# Patient Record
Sex: Female | Born: 1935 | Race: White | Hispanic: No | Smoking: Never smoker
Health system: Southern US, Community
[De-identification: ages and names within clinical notes are randomized; demographics above are authoritative.]

## PROBLEM LIST (undated history)

## (undated) DIAGNOSIS — IMO0001 Reserved for inherently not codable concepts without codable children: Secondary | ICD-10-CM

## (undated) DIAGNOSIS — K219 Gastro-esophageal reflux disease without esophagitis: Secondary | ICD-10-CM

## (undated) DIAGNOSIS — I10 Essential (primary) hypertension: Secondary | ICD-10-CM

## (undated) HISTORY — DX: Gastro-esophageal reflux disease without esophagitis: K21.9

## (undated) HISTORY — DX: Reserved for inherently not codable concepts without codable children: IMO0001

## (undated) HISTORY — PX: JOINT REPLACEMENT: SHX530

---

## 2001-03-11 ENCOUNTER — Ambulatory Visit (HOSPITAL_COMMUNITY): Admission: RE | Admit: 2001-03-11 | Discharge: 2001-03-11 | Payer: Self-pay | Admitting: Pulmonary Disease

## 2001-03-21 ENCOUNTER — Ambulatory Visit: Admission: RE | Admit: 2001-03-21 | Discharge: 2001-03-21 | Payer: Self-pay | Admitting: Pulmonary Disease

## 2007-09-23 ENCOUNTER — Emergency Department (HOSPITAL_COMMUNITY): Admission: EM | Admit: 2007-09-23 | Discharge: 2007-09-23 | Payer: Self-pay | Admitting: Emergency Medicine

## 2009-10-27 ENCOUNTER — Ambulatory Visit (HOSPITAL_COMMUNITY): Admission: RE | Admit: 2009-10-27 | Discharge: 2009-10-27 | Payer: Self-pay | Admitting: Pulmonary Disease

## 2009-10-27 ENCOUNTER — Encounter: Payer: Self-pay | Admitting: Orthopedic Surgery

## 2009-11-15 ENCOUNTER — Encounter: Payer: Self-pay | Admitting: Orthopedic Surgery

## 2009-12-01 ENCOUNTER — Ambulatory Visit: Payer: Self-pay | Admitting: Orthopedic Surgery

## 2009-12-01 DIAGNOSIS — M1710 Unilateral primary osteoarthritis, unspecified knee: Secondary | ICD-10-CM

## 2009-12-01 DIAGNOSIS — IMO0002 Reserved for concepts with insufficient information to code with codable children: Secondary | ICD-10-CM | POA: Insufficient documentation

## 2009-12-01 DIAGNOSIS — M25569 Pain in unspecified knee: Secondary | ICD-10-CM | POA: Insufficient documentation

## 2009-12-09 ENCOUNTER — Encounter: Payer: Self-pay | Admitting: Orthopedic Surgery

## 2009-12-15 ENCOUNTER — Ambulatory Visit: Payer: Self-pay | Admitting: Orthopedic Surgery

## 2010-01-12 ENCOUNTER — Ambulatory Visit: Payer: Self-pay | Admitting: Orthopedic Surgery

## 2010-05-31 ENCOUNTER — Ambulatory Visit: Payer: Self-pay | Admitting: Orthopedic Surgery

## 2010-08-15 NOTE — Letter (Signed)
Summary: Office note Dr Rulon Sera  Office note Dr Rulon Sera   Imported By: Cammie Sickle 12/02/2009 12:50:26  _____________________________________________________________________  External Attachment:    Type:   Image     Comment:   External Document

## 2010-08-15 NOTE — Assessment & Plan Note (Signed)
Summary: REQ INJECTION IN RT KNEE XR AP AP MAY'11/[facility]/AETNA/[facility]WKINS/BS[facility]   Referring Provider:  self Primary Provider:  Dr. Juanetta Gosling   History of Present Illness: 75 year old female presents for followup visit status post lateral in the winter of 2000, 10 we've fitted her with injections x2. On 2 separate occasions, which held her off in terms of pain relief for several months. She presents back after last injection in June complaining of medial pain again and some swelling.  Meds: Lipitor, Bystolic, Diovan, Prevacid, no pain med, Ibuprofen hurt her stomach.  Today is a 4 week recheck after 2nd  injection, OA.  The shot helped., however, she would like another injection of the RIGHT knee.  Complains of pain and swelling medially, stiffness, no catching, locking, or giving way        Allergies: No Known Drug Allergies  Physical Exam  Additional Exam:  She is ambulating without assistive device. She has 125 of flexion. The knee with full extension. She has medial joint line tenderness mild effusion. Normal strength normal skin normal. Pulses sensation.  Patient is oriented x3. Mood and affect are normal and overall general appearance is normal.     Impression & Recommendations:  Problem # 1:  KNEE, ARTHRITIS, DEGEN./OSTEO (ICD-715.96) Assessment Comment Only  previous x-rays show a fairly moderate to severe amount of arthritis in the knee with joint space narrowing, so I suspect her disease is progressing.  RIGHT knee intra-articular injection Verbal consent was obtained. The RIGHT knee was prepped with alcohol and ethyl chloride. 1 cc of depomedrol 40mg /cc and 4 cc of lidocaine 1% was injected. there were no complications.  Orders: Est. Patient Level III (28413) Joint Aspirate / Injection, Large (20610) Depo- Medrol 40mg  (J1030)  Patient Instructions: 1)  You have received an injection of cortisone today. You may experience increased pain at the injection site. Apply  ice pack to the area for 20 minutes every 2 hours and take 2 xtra strength tylenol every 8 hours. This increased pain will usually resolve in 24 hours. The injection will take effect in 3-10 days.  2)  return in 3 months    Orders Added: 1)  Est. Patient Level III [24401] 2)  Joint Aspirate / Injection, Large [20610] 3)  Depo- Medrol 40mg  [J1030]

## 2010-08-15 NOTE — Assessment & Plan Note (Signed)
Summary: 4 WK RE-CK KNEE/POST INJECTION/MEDICARE,AETNA/CAF   Visit Type:  Follow-up Referring Provider:  self Primary Provider:  Dr. Juanetta Gosling  CC:  recheck knee.  History of Present Illness: 75 year old female presents with pain RIGHT knee medial side since a fall on some ice in the winter of 2010.  She has medial sharp throbbing stabbing burning knee pain which is 8/10 and a constant somewhat relieved with  ibuprofen which she only takes at night she did take a prednisone Dosepak which did help.  She denies catching or locking but does complain of swelling  Meds: Lipitor, Bystolic, Diovan, Prevacid, no pain med, Ibuprofen hurt her stomach.  Today is a 4 week recheck after 2nd  injection, OA.  The shot helped.        Allergies: No Known Drug Allergies  Physical Exam  Additional Exam:  right knee  mild tenderness med joint line  ROM normal Motor 5/5 ligamnets are stable  McMurrays negative      Impression & Recommendations:  Problem # 1:  KNEE PAIN (ICD-719.46) Assessment Improved  Orders: Est. Patient Level II (16109)  Problem # 2:  KNEE, ARTHRITIS, DEGEN./OSTEO (UEA-540.98) Assessment: Improved  Orders: Est. Patient Level II (11914)  Patient Instructions: 1)  Tylenol XS if needed  2)  Reschedule if pain worsens  3)  f/u as needed

## 2010-08-15 NOTE — Letter (Signed)
Summary: History form  History form   Imported By: Jacklynn Ganong 12/09/2009 08:24:33  _____________________________________________________________________  External Attachment:    Type:   Image     Comment:   External Document

## 2010-08-15 NOTE — Assessment & Plan Note (Signed)
Summary: 2 wk RE-CK RT KNEE/MEDICARE/AETNA/CAF   Visit Type:  Follow-up Referring Provider:  self Primary Provider:  Dr. Juanetta Gosling  CC:  RECHECK RT KNEE.  History of Present Illness: 75 year old female presents with pain RIGHT knee medial side since a fall on some ice in the winter of 2010.  She has medial sharp throbbing stabbing burning knee pain which is 8/10 and a constant somewhat relieved with  ibuprofen which she only takes at night she did take a prednisone Dosepak which did help.  She denies catching or locking but does complain of swelling  Meds: Lipitor, Bystolic, Diovan, Prevacid, Ibuprofen.  TODAY IS A 2 WEEK RECHECK RIGHT KNEE AFTER INJECTION AND IBUPROFEN 800MG  three times a day   HELPS.  She still has some pain over the medial side of the knee and she feels that there is a pocket of fluid and anteromedial joint line area.           Allergies: No Known Drug Allergies  Review of Systems       denies catching locking or giving way  Physical Exam  Psych:  alert and cooperative; normal mood and affect; normal attention span and concentration   Knee Exam  General:    Well-developed, well-nourished, normal body habitus; no deformities, normal grooming.  Gait:    Normal heel-toe gait pattern bilaterally.    Skin:    Intact, no scars, lesions, rashes, cafe au lait spots, or bruising.    Inspection:    possible effusionswelling:   Palpation:    tenderness R-medial joint line.    Vascular:    There was no swelling or varicose veins. The pulses and temperature are normal. There was no edema or tenderness.  Sensory:    Gross coordination and sensation were normal.    Motor:    Motor strength 5/5 bilaterally for quadriceps, hamstrings, ankle dorsiflexion, and ankle plantar flexion.    Knee Exam:    Right:    Inspection:  Abnormal    Palpation:  Abnormal    Stability:  stable    Tenderness:  medial joint line tenderness    the McMurray sign is  still not 100% positive  The screw home test still gets some response to pain  Flexion arc 120   Impression & Recommendations:  Problem # 1:  KNEE PAIN (ICD-719.46)  cannot rule out meniscal tear  Orders: Est. Patient Level III (57846) Joint Aspirate / Injection, Large (20610) Depo- Medrol 40mg  (J1030)  Problem # 2:  KNEE, ARTHRITIS, DEGEN./OSTEO (ICD-715.96)  inject RIGHT knee  Orders: Est. Patient Level III (96295) Joint Aspirate / Injection, Large (20610) Depo- Medrol 40mg  (J1030)  Patient Instructions: 1)  You have received an injection of cortisone today. You may experience increased pain at the injection site. Apply ice pack to the area for 20 minutes every 2 hours and take 2 xtra strength tylenol every 8 hours. This increased pain will usually resolve in 24 hours. The injection will take effect in 3-10 days.  2)  4 weeks re check post 2nd injection

## 2010-08-15 NOTE — Assessment & Plan Note (Signed)
Summary: CONSULT/TREAT RT KNEE PAIN.XRAY [facility]/REF E.[facility]INS/MEDICARE,AE...   Vital Signs:  Patient profile:   75 year old female Height:      65 inches Weight:      175 pounds Pulse rate:   68 / minute Resp:     16 per minute  Vitals Entered By: [name] MD (Dec 01, 2009 11:03 AM)  Visit Type:  new patient Referring Provider:  self Primary Provider:  Dr. Juanetta Gosling  CC:  right knee pain.  History of Present Illness: 75 year old female presents with pain RIGHT knee medial side since a fall on some ice in the winter of 2010.  She has medial sharp throbbing stabbing burning knee pain which is 8/10 and a constant somewhat relieved with Porter milligrams ibuprofen which she only takes at night she did take a prednisone Dosepak which did help.  She denies catching or locking but does complain of swelling  Meds: Lipitor, Bystolic, Diovan, Prevacid.    Allergies (verified): No Known Drug Allergies  Past History:  Past Medical History: reflux hyn cholesterol  Past Surgical History: na  Family History: na  Social History: Patient is married.  retired no smoking no alcohol average daily caffeine use  Review of Systems Constitutional:  Denies weight loss, weight gain, fever, chills, and fatigue. Cardiovascular:  Denies chest pain, palpitations, fainting, and murmurs. Respiratory:  Complains of snoring; denies short of breath, wheezing, couch, tightness, pain on inspiration, and snoring . Gastrointestinal:  Complains of heartburn; denies nausea, vomiting, diarrhea, constipation, and blood in your stools. Genitourinary:  Denies frequency, urgency, difficulty urinating, painful urination, flank pain, and bleeding in urine. Neurologic:  Denies numbness, tingling, unsteady gait, dizziness, tremors, and seizure. Musculoskeletal:  Complains of joint pain, swelling, stiffness, and heat; denies instability, redness, and muscle pain. Endocrine:  Denies excessive thirst,  exessive urination, and heat or cold intolerance. Psychiatric:  Denies nervousness, depression, anxiety, and hallucinations. Skin:  Denies changes in the skin, poor healing, rash, itching, and redness. HEENT:  Denies blurred or double vision, eye pain, redness, and watering. Immunology:  Denies seasonal allergies, sinus problems, and allergic to bee stings. Hemoatologic:  Denies easy bleeding and brusing.  Physical Exam  Additional Exam:  Constitutional: vital signs see recorded values. General: normal development, nutrition, and grooming. No deformity. Body Habitus is medium. CDV: Observation and palpation was normal  Lymph: palpation of the lymph nodes were normal Skin: inspection and palpation of the skin revealed no abnormalities  Neuro: coordination: normal              DTR's normal              Sensation was normal  Psyche: Alert and oriented x 3. Mood was normal.  Affect: normal  MSK: Gait: normal   RIGHT knee medial joint line tenderness is quite significant.  She has full range of motion of the knee with some pain with terminal knee extension and terminal knee flexion, she does have a positive McMurray sign.  There is no joint effusion.  Motor exam is normal.  The knee is stable.  LEFT knee full range of motion normal strength stability and alignment no tenderness or swelling   Impression & Recommendations:  Problem # 1:  KNEE, ARTHRITIS, DEGEN./OSTEO (ICD-715.96) Assessment New  the hospital x-rays in the report both informed that she has a fair amount of medial joint space narrowing and patellofemoral disease with confirmatory osteoarthritis.  She probably has awakened her arthritis secondary to the injury and the plantar  of 2010.  I would like to try nonoperative treatment with injections, anti-inflammatory then reexamine in 2 weeks  RIGHT knee injection medial approach joint injection Verbal consent was obtained. The knee was prepped with alcohol and ethyl chloride. 1  cc of depomedrol 40mg /cc and 4 cc of lidocaine 1% was injected. there were no complications.  Orders: New Patient Level III (64403) Depo- Medrol 40mg  (J1030) Joint Aspirate / Injection, Large (20610)  Problem # 2:  KNEE PAIN (KVQ-259.56) Assessment: New  Orders: New Patient Level III (38756) Depo- Medrol 40mg  (J1030) Joint Aspirate / Injection, Large (20610)  Patient Instructions: 1)  You have received an injection of cortisone today. You may experience increased pain at the injection site. Apply ice pack to the area for 20 minutes every 2 hours and take 2 xtra strength tylenol every 8 hours. This increased pain will usually resolve in 24 hours. The injection will take effect in 3-10 days.  2)  return in 2 weeks  3)  take ibuprofen three times a day

## 2010-08-31 ENCOUNTER — Encounter: Payer: Self-pay | Admitting: Orthopedic Surgery

## 2010-08-31 ENCOUNTER — Ambulatory Visit (INDEPENDENT_AMBULATORY_CARE_PROVIDER_SITE_OTHER): Payer: Medicare Other | Admitting: Orthopedic Surgery

## 2010-08-31 DIAGNOSIS — M1710 Unilateral primary osteoarthritis, unspecified knee: Secondary | ICD-10-CM

## 2010-08-31 DIAGNOSIS — IMO0002 Reserved for concepts with insufficient information to code with codable children: Secondary | ICD-10-CM | POA: Insufficient documentation

## 2010-09-01 ENCOUNTER — Telehealth: Payer: Self-pay | Admitting: Orthopedic Surgery

## 2010-09-05 ENCOUNTER — Encounter: Payer: Self-pay | Admitting: Orthopedic Surgery

## 2010-09-06 NOTE — Assessment & Plan Note (Signed)
Summary: 3 MTH RECK RT KNEE/POS INJECT/AETNA/CAF   MAY HAVE DIFF INS T...   Visit Type:  Follow-up Referring Provider:  self Primary Provider:  Dr. Juanetta Gosling  CC:  recheck right knee.  History of Present Illness: Today is 3 month recheck on right knee OA.  Meds: Lipitor, Bystolic, Diovan, Prevacid, no pain med, Ibuprofen 800mg  at night helps.  05/31/10 last injection in this knee, right.  Injection helped some.  Her left knee is hurting for a while, no injury, no injections have been given.  Pain level today is around 8.   She is very active and she is very active in her yard. She was to keep moving.  Her LEFT knee is bothering her on the medial side without any trauma. She says it feels like something is moving around in there.  She is currently on ibuprofen 800, just at night, which seems to help. If she is having 8/10 pain today.            Allergies: No Known Drug Allergies  Review of Systems      See HPI  Physical Exam  Additional Exam:   General appearance is normal She is going to x3. She is a pleasant. She is ambulating normally. LEFT knee medial joint line tenderness positive McMurray sign. Positive click medially. He otherwise stable strength is normal. Skin is intact.  RIGHT knee mild medial tenderness. No effusion. Distal pulses and temperature normal, no edema   Impression & Recommendations:  Problem # 1:  KNEE, ARTHRITIS, DEGEN./OSTEO (ICD-715.96) Assessment Unchanged  RIGHT knee osteoarthritis, stable  Orders: Est. Patient Level III (04540)  Problem # 2:  MENISCUS TEAR, LEFT (ICD-836.2) Assessment: New  osteoarthritis possible meniscal tear, LEFT knee.  Recommend LEFT knee injection Verbal consent was obtained. The left knee was prepped with alcohol and ethyl chloride. 1 cc of depomedrol 40mg /cc and 4 cc of lidocaine 1% was injected. there were no complications.  Orders: Est. Patient Level III (98119)  Other Orders: Joint  Aspirate / Injection, Large (20610) Depo- Medrol 40mg  (J1030)  Patient Instructions: 1)  Take TYLENOL  (ACETAMINOPHEN) 500 MG three times a day AS NEEDED FOR KNEE PAIN  2)  You have received an injection of cortisone today. You may experience increased pain at the injection site. Apply ice pack to the area for 20 minutes every 2 hours and take 2 xtra strength tylenol every 8 hours. This increased pain will usually resolve in 24 hours. The injection will take effect in 3-10 days.  3)  Please schedule a follow-up appointment as needed.   Orders Added: 1)  Est. Patient Level III [14782] 2)  Joint Aspirate / Injection, Large [20610] 3)  Depo- Medrol 40mg  [J1030]

## 2010-09-12 NOTE — Letter (Signed)
Summary: Received Office note Dr Juanetta Gosling DOS 09/01/10  Office note Dr Juanetta Gosling   Imported By: Cammie Sickle 09/05/2010 11:48:48  _____________________________________________________________________  External Attachment:    Type:   Image     Comment:   External Document

## 2010-09-12 NOTE — Progress Notes (Signed)
Summary: thinks she is having a reaction to the injection  Phone Note Call from Patient   Summary of Call: Julia Choi called this morning saying she thinks she is having an allergic reaction to the injection she received yesterday.  Said her face is hot and flushed.  Told her you are in surgery today and to go to the emergency room if she thinks she needs to.  York Spaniel she will either call her primary care doctor or go to the emergency room. Her # [phone] Initial call taken by: Jacklynn Ganong,  September 01, 2010 10:26 AM

## 2011-08-23 DIAGNOSIS — I10 Essential (primary) hypertension: Secondary | ICD-10-CM | POA: Diagnosis not present

## 2011-08-23 DIAGNOSIS — E785 Hyperlipidemia, unspecified: Secondary | ICD-10-CM | POA: Diagnosis not present

## 2011-08-23 DIAGNOSIS — G4730 Sleep apnea, unspecified: Secondary | ICD-10-CM | POA: Diagnosis not present

## 2011-11-16 DIAGNOSIS — E785 Hyperlipidemia, unspecified: Secondary | ICD-10-CM | POA: Diagnosis not present

## 2011-11-16 DIAGNOSIS — I10 Essential (primary) hypertension: Secondary | ICD-10-CM | POA: Diagnosis not present

## 2011-11-22 DIAGNOSIS — E785 Hyperlipidemia, unspecified: Secondary | ICD-10-CM | POA: Diagnosis not present

## 2011-11-22 DIAGNOSIS — I6789 Other cerebrovascular disease: Secondary | ICD-10-CM | POA: Diagnosis not present

## 2011-11-22 DIAGNOSIS — I10 Essential (primary) hypertension: Secondary | ICD-10-CM | POA: Diagnosis not present

## 2012-02-21 DIAGNOSIS — G4730 Sleep apnea, unspecified: Secondary | ICD-10-CM | POA: Diagnosis not present

## 2012-02-21 DIAGNOSIS — I10 Essential (primary) hypertension: Secondary | ICD-10-CM | POA: Diagnosis not present

## 2012-02-21 DIAGNOSIS — E785 Hyperlipidemia, unspecified: Secondary | ICD-10-CM | POA: Diagnosis not present

## 2012-04-08 DIAGNOSIS — H26499 Other secondary cataract, unspecified eye: Secondary | ICD-10-CM | POA: Diagnosis not present

## 2012-04-08 DIAGNOSIS — Z961 Presence of intraocular lens: Secondary | ICD-10-CM | POA: Diagnosis not present

## 2012-05-07 DIAGNOSIS — Z23 Encounter for immunization: Secondary | ICD-10-CM | POA: Diagnosis not present

## 2012-05-27 DIAGNOSIS — I10 Essential (primary) hypertension: Secondary | ICD-10-CM | POA: Diagnosis not present

## 2012-05-27 DIAGNOSIS — E785 Hyperlipidemia, unspecified: Secondary | ICD-10-CM | POA: Diagnosis not present

## 2012-08-27 DIAGNOSIS — I10 Essential (primary) hypertension: Secondary | ICD-10-CM | POA: Diagnosis not present

## 2012-08-27 DIAGNOSIS — E785 Hyperlipidemia, unspecified: Secondary | ICD-10-CM | POA: Diagnosis not present

## 2012-08-27 DIAGNOSIS — F411 Generalized anxiety disorder: Secondary | ICD-10-CM | POA: Diagnosis not present

## 2012-09-03 DIAGNOSIS — I10 Essential (primary) hypertension: Secondary | ICD-10-CM | POA: Diagnosis not present

## 2012-09-03 DIAGNOSIS — E785 Hyperlipidemia, unspecified: Secondary | ICD-10-CM | POA: Diagnosis not present

## 2012-12-11 DIAGNOSIS — E785 Hyperlipidemia, unspecified: Secondary | ICD-10-CM | POA: Diagnosis not present

## 2012-12-11 DIAGNOSIS — F411 Generalized anxiety disorder: Secondary | ICD-10-CM | POA: Diagnosis not present

## 2012-12-11 DIAGNOSIS — I10 Essential (primary) hypertension: Secondary | ICD-10-CM | POA: Diagnosis not present

## 2012-12-11 DIAGNOSIS — G4730 Sleep apnea, unspecified: Secondary | ICD-10-CM | POA: Diagnosis not present

## 2013-03-13 DIAGNOSIS — G4730 Sleep apnea, unspecified: Secondary | ICD-10-CM | POA: Diagnosis not present

## 2013-03-13 DIAGNOSIS — R5381 Other malaise: Secondary | ICD-10-CM | POA: Diagnosis not present

## 2013-03-13 DIAGNOSIS — E669 Obesity, unspecified: Secondary | ICD-10-CM | POA: Diagnosis not present

## 2013-03-13 DIAGNOSIS — E785 Hyperlipidemia, unspecified: Secondary | ICD-10-CM | POA: Diagnosis not present

## 2013-03-18 DIAGNOSIS — R7989 Other specified abnormal findings of blood chemistry: Secondary | ICD-10-CM | POA: Diagnosis not present

## 2013-03-18 DIAGNOSIS — I10 Essential (primary) hypertension: Secondary | ICD-10-CM | POA: Diagnosis not present

## 2013-03-18 DIAGNOSIS — E669 Obesity, unspecified: Secondary | ICD-10-CM | POA: Diagnosis not present

## 2013-03-18 DIAGNOSIS — F411 Generalized anxiety disorder: Secondary | ICD-10-CM | POA: Diagnosis not present

## 2013-03-18 DIAGNOSIS — G4730 Sleep apnea, unspecified: Secondary | ICD-10-CM | POA: Diagnosis not present

## 2013-03-18 DIAGNOSIS — R5381 Other malaise: Secondary | ICD-10-CM | POA: Diagnosis not present

## 2013-03-18 DIAGNOSIS — E785 Hyperlipidemia, unspecified: Secondary | ICD-10-CM | POA: Diagnosis not present

## 2013-04-15 DIAGNOSIS — H26499 Other secondary cataract, unspecified eye: Secondary | ICD-10-CM | POA: Diagnosis not present

## 2013-04-15 DIAGNOSIS — Z961 Presence of intraocular lens: Secondary | ICD-10-CM | POA: Diagnosis not present

## 2013-05-08 DIAGNOSIS — Z23 Encounter for immunization: Secondary | ICD-10-CM | POA: Diagnosis not present

## 2013-05-18 DIAGNOSIS — H26499 Other secondary cataract, unspecified eye: Secondary | ICD-10-CM | POA: Diagnosis not present

## 2013-06-17 DIAGNOSIS — M1990 Unspecified osteoarthritis, unspecified site: Secondary | ICD-10-CM | POA: Diagnosis not present

## 2013-06-17 DIAGNOSIS — I10 Essential (primary) hypertension: Secondary | ICD-10-CM | POA: Diagnosis not present

## 2013-06-17 DIAGNOSIS — E785 Hyperlipidemia, unspecified: Secondary | ICD-10-CM | POA: Diagnosis not present

## 2013-06-17 DIAGNOSIS — G4730 Sleep apnea, unspecified: Secondary | ICD-10-CM | POA: Diagnosis not present

## 2013-09-25 DIAGNOSIS — E785 Hyperlipidemia, unspecified: Secondary | ICD-10-CM | POA: Diagnosis not present

## 2013-09-25 DIAGNOSIS — I10 Essential (primary) hypertension: Secondary | ICD-10-CM | POA: Diagnosis not present

## 2013-09-25 DIAGNOSIS — F411 Generalized anxiety disorder: Secondary | ICD-10-CM | POA: Diagnosis not present

## 2014-03-31 DIAGNOSIS — R7989 Other specified abnormal findings of blood chemistry: Secondary | ICD-10-CM | POA: Diagnosis not present

## 2014-03-31 DIAGNOSIS — G4730 Sleep apnea, unspecified: Secondary | ICD-10-CM | POA: Diagnosis not present

## 2014-03-31 DIAGNOSIS — K2100 Gastro-esophageal reflux disease with esophagitis, without bleeding: Secondary | ICD-10-CM | POA: Diagnosis not present

## 2014-03-31 DIAGNOSIS — E785 Hyperlipidemia, unspecified: Secondary | ICD-10-CM | POA: Diagnosis not present

## 2014-03-31 DIAGNOSIS — Z23 Encounter for immunization: Secondary | ICD-10-CM | POA: Diagnosis not present

## 2014-03-31 DIAGNOSIS — I10 Essential (primary) hypertension: Secondary | ICD-10-CM | POA: Diagnosis not present

## 2014-03-31 DIAGNOSIS — G4700 Insomnia, unspecified: Secondary | ICD-10-CM | POA: Diagnosis not present

## 2014-04-12 DIAGNOSIS — Z23 Encounter for immunization: Secondary | ICD-10-CM | POA: Diagnosis not present

## 2014-09-29 DIAGNOSIS — E119 Type 2 diabetes mellitus without complications: Secondary | ICD-10-CM | POA: Diagnosis not present

## 2014-09-29 DIAGNOSIS — I10 Essential (primary) hypertension: Secondary | ICD-10-CM | POA: Diagnosis not present

## 2015-01-03 DIAGNOSIS — H26492 Other secondary cataract, left eye: Secondary | ICD-10-CM | POA: Diagnosis not present

## 2015-01-03 DIAGNOSIS — H04123 Dry eye syndrome of bilateral lacrimal glands: Secondary | ICD-10-CM | POA: Diagnosis not present

## 2015-01-06 DIAGNOSIS — I10 Essential (primary) hypertension: Secondary | ICD-10-CM | POA: Diagnosis not present

## 2015-01-06 DIAGNOSIS — G4710 Hypersomnia, unspecified: Secondary | ICD-10-CM | POA: Diagnosis not present

## 2015-01-06 DIAGNOSIS — F419 Anxiety disorder, unspecified: Secondary | ICD-10-CM | POA: Diagnosis not present

## 2015-01-06 DIAGNOSIS — E119 Type 2 diabetes mellitus without complications: Secondary | ICD-10-CM | POA: Diagnosis not present

## 2015-05-31 ENCOUNTER — Other Ambulatory Visit (HOSPITAL_COMMUNITY): Payer: Self-pay | Admitting: Pulmonary Disease

## 2015-05-31 DIAGNOSIS — R1011 Right upper quadrant pain: Secondary | ICD-10-CM

## 2015-05-31 DIAGNOSIS — E119 Type 2 diabetes mellitus without complications: Secondary | ICD-10-CM | POA: Diagnosis not present

## 2015-05-31 DIAGNOSIS — Z23 Encounter for immunization: Secondary | ICD-10-CM | POA: Diagnosis not present

## 2015-05-31 DIAGNOSIS — F419 Anxiety disorder, unspecified: Secondary | ICD-10-CM | POA: Diagnosis not present

## 2015-05-31 DIAGNOSIS — I10 Essential (primary) hypertension: Secondary | ICD-10-CM | POA: Diagnosis not present

## 2015-05-31 DIAGNOSIS — Z634 Disappearance and death of family member: Secondary | ICD-10-CM | POA: Diagnosis not present

## 2015-06-02 ENCOUNTER — Ambulatory Visit (HOSPITAL_COMMUNITY)
Admission: RE | Admit: 2015-06-02 | Discharge: 2015-06-02 | Disposition: A | Discharge status: Home / Self Care | Payer: Medicare Other | Source: Ambulatory Visit | Attending: Pulmonary Disease | Admitting: Pulmonary Disease

## 2015-06-02 DIAGNOSIS — R1011 Right upper quadrant pain: Secondary | ICD-10-CM | POA: Insufficient documentation

## 2015-06-03 ENCOUNTER — Other Ambulatory Visit (HOSPITAL_COMMUNITY): Payer: Self-pay | Admitting: Pulmonary Disease

## 2015-06-03 DIAGNOSIS — R1011 Right upper quadrant pain: Secondary | ICD-10-CM

## 2015-06-07 ENCOUNTER — Encounter (HOSPITAL_COMMUNITY): Payer: Self-pay

## 2015-06-07 ENCOUNTER — Encounter (HOSPITAL_COMMUNITY)
Admission: RE | Admit: 2015-06-07 | Discharge: 2015-06-07 | Disposition: A | Discharge status: Home / Self Care | Payer: Medicare Other | Source: Ambulatory Visit | Attending: Pulmonary Disease | Admitting: Pulmonary Disease

## 2015-06-07 DIAGNOSIS — R1011 Right upper quadrant pain: Secondary | ICD-10-CM | POA: Diagnosis not present

## 2015-06-07 HISTORY — DX: Essential (primary) hypertension: I10

## 2015-06-07 MED ORDER — STERILE WATER FOR INJECTION IJ SOLN
INTRAMUSCULAR | Status: AC
  Administered 2015-06-07: 1.55 mL via INTRAVENOUS
  Filled 2015-06-07: qty 10

## 2015-06-07 MED ORDER — TECHNETIUM TC 99M MEBROFENIN IV KIT
5.0000 | PACK | Freq: Once | INTRAVENOUS | Status: DC | PRN
  Administered 2015-06-07: 5.4 via INTRAVENOUS
  Filled 2015-06-07: qty 6

## 2015-06-07 MED ORDER — SINCALIDE 5 MCG IJ SOLR
INTRAMUSCULAR | Status: AC
  Administered 2015-06-07: 1.55 ug via INTRAVENOUS
  Filled 2015-06-07: qty 5

## 2015-06-07 MED ORDER — SODIUM CHLORIDE 0.9 % IJ SOLN
INTRAMUSCULAR | Status: AC
  Filled 2015-06-07: qty 12

## 2016-12-15 ENCOUNTER — Inpatient Hospital Stay (HOSPITAL_COMMUNITY)
Admission: EM | Admit: 2016-12-15 | Discharge: 2016-12-18 | DRG: 482 | Disposition: A | Discharge status: Skilled Nursing Facility | Payer: Medicare Other | Attending: Pulmonary Disease | Admitting: Pulmonary Disease

## 2016-12-15 ENCOUNTER — Encounter (HOSPITAL_COMMUNITY): Payer: Self-pay | Admitting: *Deleted

## 2016-12-15 ENCOUNTER — Emergency Department (HOSPITAL_COMMUNITY): Payer: Medicare Other

## 2016-12-15 DIAGNOSIS — E785 Hyperlipidemia, unspecified: Secondary | ICD-10-CM | POA: Diagnosis present

## 2016-12-15 DIAGNOSIS — I10 Essential (primary) hypertension: Secondary | ICD-10-CM

## 2016-12-15 DIAGNOSIS — D696 Thrombocytopenia, unspecified: Secondary | ICD-10-CM | POA: Diagnosis present

## 2016-12-15 DIAGNOSIS — S72001A Fracture of unspecified part of neck of right femur, initial encounter for closed fracture: Secondary | ICD-10-CM

## 2016-12-15 DIAGNOSIS — M25551 Pain in right hip: Secondary | ICD-10-CM | POA: Diagnosis present

## 2016-12-15 DIAGNOSIS — G4730 Sleep apnea, unspecified: Secondary | ICD-10-CM | POA: Diagnosis present

## 2016-12-15 DIAGNOSIS — S72144A Nondisplaced intertrochanteric fracture of right femur, initial encounter for closed fracture: Principal | ICD-10-CM | POA: Diagnosis present

## 2016-12-15 DIAGNOSIS — E782 Mixed hyperlipidemia: Secondary | ICD-10-CM

## 2016-12-15 DIAGNOSIS — K219 Gastro-esophageal reflux disease without esophagitis: Secondary | ICD-10-CM | POA: Diagnosis present

## 2016-12-15 DIAGNOSIS — Z888 Allergy status to other drugs, medicaments and biological substances status: Secondary | ICD-10-CM | POA: Diagnosis not present

## 2016-12-15 DIAGNOSIS — Y92015 Private garage of single-family (private) house as the place of occurrence of the external cause: Secondary | ICD-10-CM

## 2016-12-15 DIAGNOSIS — W19XXXA Unspecified fall, initial encounter: Secondary | ICD-10-CM

## 2016-12-15 DIAGNOSIS — D649 Anemia, unspecified: Secondary | ICD-10-CM | POA: Diagnosis not present

## 2016-12-15 DIAGNOSIS — W010XXA Fall on same level from slipping, tripping and stumbling without subsequent striking against object, initial encounter: Secondary | ICD-10-CM | POA: Diagnosis present

## 2016-12-15 DIAGNOSIS — R112 Nausea with vomiting, unspecified: Secondary | ICD-10-CM

## 2016-12-15 DIAGNOSIS — E876 Hypokalemia: Secondary | ICD-10-CM | POA: Diagnosis present

## 2016-12-15 DIAGNOSIS — S72001D Fracture of unspecified part of neck of right femur, subsequent encounter for closed fracture with routine healing: Secondary | ICD-10-CM

## 2016-12-15 DIAGNOSIS — S72009A Fracture of unspecified part of neck of unspecified femur, initial encounter for closed fracture: Secondary | ICD-10-CM | POA: Diagnosis present

## 2016-12-15 LAB — CBC WITH DIFFERENTIAL/PLATELET
BASOS ABS: 0 10*3/uL (ref 0.0–0.1)
Basophils Relative: 0 %
EOS PCT: 0 %
Eosinophils Absolute: 0 10*3/uL (ref 0.0–0.7)
HCT: 31.7 % — ABNORMAL LOW (ref 36.0–46.0)
Hemoglobin: 11.1 g/dL — ABNORMAL LOW (ref 12.0–15.0)
LYMPHS ABS: 1.6 10*3/uL (ref 0.7–4.0)
LYMPHS PCT: 31 %
MCH: 31.9 pg (ref 26.0–34.0)
MCHC: 35 g/dL (ref 30.0–36.0)
MCV: 91.1 fL (ref 78.0–100.0)
MONO ABS: 0.2 10*3/uL (ref 0.1–1.0)
Monocytes Relative: 4 %
Neutro Abs: 3.4 10*3/uL (ref 1.7–7.7)
Neutrophils Relative %: 65 %
PLATELETS: 140 10*3/uL — AB (ref 150–400)
RBC: 3.48 MIL/uL — ABNORMAL LOW (ref 3.87–5.11)
RDW: 12.9 % (ref 11.5–15.5)
WBC: 5.2 10*3/uL (ref 4.0–10.5)

## 2016-12-15 LAB — BASIC METABOLIC PANEL
Anion gap: 8 (ref 5–15)
BUN: 20 mg/dL (ref 6–20)
CO2: 27 mmol/L (ref 22–32)
Calcium: 9 mg/dL (ref 8.9–10.3)
Chloride: 103 mmol/L (ref 101–111)
Creatinine, Ser: 1.08 mg/dL — ABNORMAL HIGH (ref 0.44–1.00)
GFR calc Af Amer: 55 mL/min — ABNORMAL LOW (ref >60)
GFR, EST NON AFRICAN AMERICAN: 48 mL/min — AB (ref >60)
GLUCOSE: 167 mg/dL — AB (ref 65–99)
POTASSIUM: 3.1 mmol/L — AB (ref 3.5–5.1)
Sodium: 138 mmol/L (ref 135–145)

## 2016-12-15 LAB — URINALYSIS, ROUTINE W REFLEX MICROSCOPIC
Bilirubin Urine: NEGATIVE
Glucose, UA: NEGATIVE mg/dL
Hgb urine dipstick: NEGATIVE
Ketones, ur: NEGATIVE mg/dL
Leukocytes, UA: NEGATIVE
NITRITE: NEGATIVE
Protein, ur: NEGATIVE mg/dL
SPECIFIC GRAVITY, URINE: 1.009 (ref 1.005–1.030)
pH: 6 (ref 5.0–8.0)

## 2016-12-15 LAB — ABO/RH: ABO/RH(D): O POS

## 2016-12-15 LAB — PREPARE RBC (CROSSMATCH)

## 2016-12-15 LAB — PROTIME-INR
INR: 1.15
Prothrombin Time: 14.7 seconds (ref 11.4–15.2)

## 2016-12-15 MED ORDER — METHOCARBAMOL 1000 MG/10ML IJ SOLN
500.0000 mg | Freq: Four times a day (QID) | INTRAVENOUS | Status: DC | PRN
  Filled 2016-12-15: qty 5

## 2016-12-15 MED ORDER — NEBIVOLOL HCL 10 MG PO TABS
10.0000 mg | ORAL_TABLET | Freq: Every day | ORAL | Status: DC
  Administered 2016-12-15 – 2016-12-18 (×4): 10 mg via ORAL
  Filled 2016-12-15 (×4): qty 1

## 2016-12-15 MED ORDER — PROMETHAZINE HCL 25 MG/ML IJ SOLN
12.5000 mg | Freq: Once | INTRAMUSCULAR | Status: AC
  Administered 2016-12-15: 12.5 mg via INTRAVENOUS
  Filled 2016-12-15: qty 1

## 2016-12-15 MED ORDER — SODIUM CHLORIDE 0.9 % IV SOLN
INTRAVENOUS | Status: DC
  Administered 2016-12-15 – 2016-12-16 (×4): via INTRAVENOUS

## 2016-12-15 MED ORDER — CHLORHEXIDINE GLUCONATE 4 % EX LIQD
60.0000 mL | Freq: Once | CUTANEOUS | Status: AC
  Administered 2016-12-16: 4 via TOPICAL
  Filled 2016-12-15: qty 15

## 2016-12-15 MED ORDER — POLYETHYLENE GLYCOL 3350 17 G PO PACK: 17.0000 g | PACK | Freq: Every day | ORAL | Status: DC | PRN

## 2016-12-15 MED ORDER — HEPARIN SODIUM (PORCINE) 5000 UNIT/ML IJ SOLN
5000.0000 [IU] | Freq: Three times a day (TID) | INTRAMUSCULAR | Status: DC
  Administered 2016-12-15: 5000 [IU] via SUBCUTANEOUS
  Filled 2016-12-15: qty 1

## 2016-12-15 MED ORDER — ONDANSETRON HCL 4 MG/2ML IJ SOLN
4.0000 mg | Freq: Once | INTRAMUSCULAR | Status: AC
  Administered 2016-12-15: 4 mg via INTRAVENOUS
  Filled 2016-12-15: qty 2

## 2016-12-15 MED ORDER — MORPHINE SULFATE (PF) 4 MG/ML IV SOLN
4.0000 mg | INTRAVENOUS | Status: DC | PRN
  Administered 2016-12-15: 4 mg via INTRAVENOUS
  Filled 2016-12-15: qty 1

## 2016-12-15 MED ORDER — POVIDONE-IODINE 10 % EX SWAB
2.0000 "application " | Freq: Once | CUTANEOUS | Status: AC
  Administered 2016-12-16: 2 via TOPICAL

## 2016-12-15 MED ORDER — MORPHINE SULFATE (PF) 2 MG/ML IV SOLN
0.5000 mg | INTRAVENOUS | Status: DC | PRN
  Administered 2016-12-15 – 2016-12-18 (×8): 0.5 mg via INTRAVENOUS
  Filled 2016-12-15 (×8): qty 1

## 2016-12-15 MED ORDER — HYDROCODONE-ACETAMINOPHEN 5-325 MG PO TABS
1.0000 | ORAL_TABLET | Freq: Four times a day (QID) | ORAL | Status: DC | PRN
  Administered 2016-12-15 – 2016-12-16 (×2): 1 via ORAL
  Filled 2016-12-15 (×2): qty 1

## 2016-12-15 MED ORDER — FAMOTIDINE 20 MG PO TABS
20.0000 mg | ORAL_TABLET | Freq: Two times a day (BID) | ORAL | Status: DC
  Administered 2016-12-15 – 2016-12-18 (×5): 20 mg via ORAL
  Filled 2016-12-15 (×5): qty 1

## 2016-12-15 MED ORDER — METHOCARBAMOL 500 MG PO TABS
500.0000 mg | ORAL_TABLET | Freq: Four times a day (QID) | ORAL | Status: DC | PRN
  Administered 2016-12-16 – 2016-12-17 (×2): 500 mg via ORAL
  Filled 2016-12-15 (×2): qty 1

## 2016-12-15 MED ORDER — SODIUM CHLORIDE 0.9 % IV SOLN: Freq: Once | INTRAVENOUS | Status: AC

## 2016-12-15 MED ORDER — HEPARIN SODIUM (PORCINE) 5000 UNIT/ML IJ SOLN: 5000.0000 [IU] | Freq: Three times a day (TID) | INTRAMUSCULAR | Status: DC

## 2016-12-15 MED ORDER — SODIUM CHLORIDE 0.9 % IV BOLUS (SEPSIS)
1000.0000 mL | Freq: Once | INTRAVENOUS | Status: AC
  Administered 2016-12-15: 1000 mL via INTRAVENOUS

## 2016-12-15 MED ORDER — ATORVASTATIN CALCIUM 10 MG PO TABS
10.0000 mg | ORAL_TABLET | Freq: Every day | ORAL | Status: DC
  Administered 2016-12-15 – 2016-12-17 (×3): 10 mg via ORAL
  Filled 2016-12-15 (×3): qty 1

## 2016-12-15 NOTE — ED Notes (Signed)
Patient transported to X-ray 

## 2016-12-15 NOTE — ED Provider Notes (Signed)
[facility]-EMERGENCY DEPT Provider Note   CSN: [id number] Arrival date & time: 12/15/16  1237     History   Chief Complaint Chief Complaint  Patient presents with  . Fall    HPI Julia Choi is a 81 y.o. female.  Pt presents to the ED today with right hip pain.  Pt said she went to the garage and saw a snake.  She tripped and landed on her right hip.  The pt did receive 100 mcg fentanyl en route by EMS which made her vomit.  Pt denies any other injury.      Past Medical History:  Diagnosis Date  . Hypertension   . Reflux    cholesterol, hyn    Patient Active Problem List   Diagnosis Date Noted  . MENISCUS TEAR, LEFT 08/31/2010  . KNEE, ARTHRITIS, DEGEN./OSTEO 12/01/2009  . KNEE PAIN 12/01/2009    History reviewed. No pertinent surgical history.  OB History    No data available       Home Medications    Prior to Admission medications   Not on File    Family History History reviewed. No pertinent family history.  Social History Social History  Substance Use Topics  . Smoking status: Never Smoker  . Smokeless tobacco: Never Used  . Alcohol use No     Allergies   Patient has no allergy information on record.   Review of Systems Review of Systems  Musculoskeletal:       Right hip  All other systems reviewed and are negative.    Physical Exam Updated Vital Signs BP 139/63   Pulse 69   Temp 98.4 F (36.9 C) (Oral)   Resp 16   SpO2 100%   Physical Exam  Constitutional: She is oriented to person, place, and time. She appears well-developed and well-nourished.  HENT:  Head: Normocephalic and atraumatic.  Right Ear: External ear normal.  Left Ear: External ear normal.  Nose: Nose normal.  Mouth/Throat: Oropharynx is clear and moist.  Eyes: Conjunctivae and EOM are normal. Pupils are equal, round, and reactive to light.  Neck: Normal range of motion. Neck supple.  Cardiovascular: Regular rhythm, normal heart sounds and intact distal  pulses.  Bradycardia present.   Pulmonary/Chest: Effort normal and breath sounds normal.  Abdominal: Soft. Bowel sounds are normal.  Musculoskeletal:       Right hip: She exhibits decreased range of motion, tenderness and deformity.  Neurological: She is alert and oriented to person, place, and time.  Skin: Skin is warm.  Psychiatric: She has a normal mood and affect. Her behavior is normal. Judgment and thought content normal.  Nursing note and vitals reviewed.    ED Treatments / Results  Labs (all labs ordered are listed, but only abnormal results are displayed) Labs Reviewed  BASIC METABOLIC PANEL - Abnormal; Notable for the following:       Result Value   Potassium 3.1 (*)    Glucose, Bld 167 (*)    Creatinine, Ser 1.08 (*)    GFR calc non Af Amer 48 (*)    GFR calc Af Amer 55 (*)    All other components within normal limits  CBC WITH DIFFERENTIAL/PLATELET - Abnormal; Notable for the following:    RBC 3.48 (*)    Hemoglobin 11.1 (*)    HCT 31.7 (*)    Platelets 140 (*)    All other components within normal limits  PROTIME-INR  URINALYSIS, ROUTINE W REFLEX MICROSCOPIC  TYPE AND  SCREEN    EKG  EKG Interpretation  Date/Time:  Saturday December 15 2016 12:55:59 EDT Ventricular Rate:  45 PR Interval:    QRS Duration: 105 QT Interval:  487 QTC Calculation: 422 R Axis:   53 Text Interpretation:  Sinus bradycardia Atrial premature complex Confirmed by Jacalyn Lefevre [phone]) on 12/15/2016 1:00:03 PM       Radiology Dg Chest 1 View  Result Date: 12/15/2016 CLINICAL DATA:  Post fall, now with right hip pain. Preoperative examination. EXAM: CHEST 1 VIEW COMPARISON:  None. FINDINGS: Normal cardiac silhouette and mediastinal contours. No focal parenchymal opacities. No supine evidence of pleural effusion or pneumothorax. No evidence of edema. No acute osseus abnormalities. IMPRESSION: No acute cardiopulmonary disease. Electronically Signed   By: Simonne Come M.D.   On: 12/15/2016  14:10   Dg Hip Unilat  With Pelvis 2-3 Views Right  Result Date: 12/15/2016 CLINICAL DATA:  Recent fall with right hip pain, initial encounter EXAM: DG HIP (WITH OR WITHOUT PELVIS) 2-3V RIGHT COMPARISON:  None. FINDINGS: Comminuted intratrochanteric fracture is noted with mild displacement. No significant impaction and angulation is seen. Pelvic ring is intact. No soft tissue abnormality is noted. IMPRESSION: Comminuted intratrochanteric right femoral fracture Electronically Signed   By: Alcide Clever M.D.   On: 12/15/2016 14:11   Dg Femur, Min 2 Views Right  Result Date: 12/15/2016 CLINICAL DATA:  Recent fall with known proximal femoral fracture EXAM: RIGHT FEMUR 2 VIEWS COMPARISON:  None. FINDINGS: The comminuted intratrochanteric fracture is again identified. Degenerative changes are noted about the knee joint. No other femoral fracture is seen. No soft tissue abnormality is noted. IMPRESSION: Proximal right femoral fracture. The more distal femur is within normal limits with the exception of degenerative changes about the knee joint. Electronically Signed   By: Alcide Clever M.D.   On: 12/15/2016 14:11    Procedures Procedures (including critical care time)  Medications Ordered in ED Medications  sodium chloride 0.9 % bolus 1,000 mL (1,000 mLs Intravenous New Bag/Given 12/15/16 1256)    And  0.9 %  sodium chloride infusion (not administered)  ondansetron (ZOFRAN) injection 4 mg (4 mg Intravenous Given 12/15/16 1255)  promethazine (PHENERGAN) injection 12.5 mg (12.5 mg Intravenous Given 12/15/16 1426)     Initial Impression / Assessment and Plan / ED Course  I have reviewed the triage vital signs and the nursing notes.  Pertinent labs & imaging results that were available during my care of the patient were reviewed by me and considered in my medical decision making (see chart for details).     Vomiting is from pain meds as it did not start until she was given fentanyl.  Pt d/w Dr. Romeo Apple  (ortho) who will see her.  Pt d/w Dr. Rinaldo Ratel (triad) who will admit her.  Final Clinical Impressions(s) / ED Diagnoses   Final diagnoses:  Closed nondisplaced intertrochanteric fracture of right femur, initial encounter (HCC)  Fall, initial encounter  Non-intractable vomiting with nausea, unspecified vomiting type    New Prescriptions New Prescriptions   No medications on file     Jacalyn Lefevre, MD 12/15/16 1444

## 2016-12-15 NOTE — ED Notes (Signed)
Patient gave me permission to call her next door neighbor Bronson CurbRuth Scott at [phone] @ [phone].

## 2016-12-15 NOTE — Plan of Care (Signed)
Problem: Pain Managment: Goal: General experience of comfort will improve Outcome: Not Progressing Pt c/o pain 5-10/10. Norco 1 tab and Morphine given as needed. Pt states hardly any relief with pain medication and is eager for surgery in am. Pt refusing to turn on L side d/t hurting. Educated pt on infection prevention as well as importance of preventing skin breakdown as well. Pt verbalized understanding. Pt adv of pain medication and times they are due. Will continue to monitor pt

## 2016-12-15 NOTE — Progress Notes (Addendum)
ANTICOAGULATION CONSULT NOTE - Initial Consult  Pharmacy Consult for Heparin Indication: VTE prophylaxis  Allergies  Allergen Reactions  . Prednisone Palpitations    Patient Measurements: Height: 5\' 4"  (162.6 cm) Weight: 150 lb (68 kg) IBW/kg (Calculated) : 54.7  Vital Signs: Temp: 98.6 F (37 C) (06/02 1631) Temp Source: Oral (06/02 1631) BP: 112/54 (06/02 1631) Pulse Rate: 57 (06/02 1631)  Labs:  Recent Labs  12/15/16 1304  HGB 11.1*  HCT 31.7*  PLT 140*  LABPROT 14.7  INR 1.15  CREATININE 1.08*    Estimated Creatinine Clearance: 40.7 mL/min (A) (by C-G formula based on SCr of 1.08 mg/dL (H)).   Medical History: Past Medical History:  Diagnosis Date  . Hypertension   . Reflux    cholesterol, hyn    Medications:  Prescriptions Prior to Admission  Medication Sig Dispense Refill Last Dose  . atorvastatin (LIPITOR) 10 MG tablet Take 10 mg by mouth every evening.    12/14/2016 at Unknown time  . cetirizine (ZYRTEC) 10 MG tablet Take 10 mg by mouth daily as needed for allergies.   Past Week at Unknown time  . losartan-hydrochlorothiazide (HYZAAR) 100-25 MG tablet Take 1 tablet by mouth daily.   12/15/2016 at Unknown time  . Nebivolol HCl (BYSTOLIC) 20 MG TABS Take 20 mg by mouth daily.   12/15/2016 at Unknown time  . ranitidine (ZANTAC) 150 MG tablet Take 150 mg by mouth 2 (two) times daily.   12/15/2016 at Unknown time    Assessment: 81 yo female presents with hip pain. She fell and fractured her hip. Pharmacy asked to start heparin for VTE prophylaxis.   Goal of Therapy:  Monitor platelets by anticoagulation protocol: Yes   Plan:  Heparin 5000 units sq q8h Will hold heparin in AM in anticipation of surgery 6/3 Monitor for bleeding  Elder CyphersLorie Dailee Manalang, BS Loura BackPharm D, BCPS Clinical Pharmacist Pager [phone] 12/15/2016,6:54 PM

## 2016-12-15 NOTE — H&P (Signed)
History and Physical    Julia Choi YNW:[id number] DOB: Sep 16, 1935 DOA: 12/15/2016  PCP: Kari BaarsHawkins, Edward, MD   Patient coming from: Home  Chief Complaint: Hip pain s/p   HPI: Julia HartiganBetty D Garrison is a 81 y.o. female with medical history significant of HTN, Reflux presents s/p fall.  Says she was cleaning her garage and noticed a snake which frightened her causing her to fall.  She experienced significant pain and called to a friend who called the ambulance.  Patient is very active normally and denies any significant medical problems.  She is anxious to get a cup of water.  Wants to get surgery sooner rather than later.  ED Course: Patient found to have Proximal right femoral fracture. The more distal femur is within normal limits with the exception of degenerative changes about the knee joint.  Dr. Romeo AppleHarrison of orthopedics was consulted and states he will perform surgical fixation.  Review of Systems: As per HPI otherwise 10 point review of systems negative.    Past Medical History:  Diagnosis Date  . Hypertension   . Reflux    cholesterol, hyn    History reviewed. No pertinent surgical history.   reports that she has never smoked. She has never used smokeless tobacco. She reports that she does not drink alcohol or use drugs.  Allergies  Allergen Reactions  . Prednisone Palpitations    History reviewed. No pertinent family history.   Prior to Admission medications   Not on File    Physical Exam: Vitals:   12/15/16 1500 12/15/16 1530 12/15/16 1600 12/15/16 1631  BP: 122/70 111/62 (!) 117/59 (!) 112/54  Pulse: 73 (!) 57 (!) 55 (!) 57  Resp: (!) 21 20 (!) 22 19  Temp:    98.6 F (37 C)  TempSrc:    Oral  SpO2: 100% 96% 99% 100%      Constitutional: NAD, calm, comfortable Vitals:   12/15/16 1500 12/15/16 1530 12/15/16 1600 12/15/16 1631  BP: 122/70 111/62 (!) 117/59 (!) 112/54  Pulse: 73 (!) 57 (!) 55 (!) 57  Resp: (!) 21 20 (!) 22 19  Temp:    98.6 F (37 C)    TempSrc:    Oral  SpO2: 100% 96% 99% 100%   Eyes: PERRL, lids and conjunctivae normal ENMT: Mucous membranes are moist. Posterior pharynx clear of any exudate or lesions.Normal dentition.  Neck: normal, supple, no masses, no thyromegaly Respiratory: clear to auscultation bilaterally, no wheezing, no crackles. Normal respiratory effort. No accessory muscle use.  Cardiovascular: Regular rate and rhythm, no murmurs / rubs / gallops. No extremity edema. 2+ pedal pulses. No carotid bruits.  Abdomen: no tenderness, no masses palpated. No hepatosplenomegaly. Bowel sounds positive.  Musculoskeletal: right leg internally rotated and shortened. Pain with palpation around hip. Skin: no rashes, lesions, ulcers. No induration Neurologic: CN 2-12 grossly intact. Sensation intact, DTR normal. Strength 5/5 in 3 extremities (did not test right hip). Patient able to move right foot dorsi and plantar flex  Psychiatric: Normal judgment and insight. Alert and oriented x 3. Normal mood.     Labs on Admission: I have personally reviewed following labs and imaging studies  CBC:  Recent Labs Lab 12/15/16 1304  WBC 5.2  NEUTROABS 3.4  HGB 11.1*  HCT 31.7*  MCV 91.1  PLT 140*   Basic Metabolic Panel:  Recent Labs Lab 12/15/16 1304  NA 138  K 3.1*  CL 103  CO2 27  GLUCOSE 167*  BUN 20  CREATININE  1.08*  CALCIUM 9.0   GFR: CrCl cannot be calculated (Unknown ideal weight.). Liver Function Tests: No results for input(s): AST, ALT, ALKPHOS, BILITOT, PROT, ALBUMIN in the last 168 hours. No results for input(s): LIPASE, AMYLASE in the last 168 hours. No results for input(s): AMMONIA in the last 168 hours. Coagulation Profile:  Recent Labs Lab 12/15/16 1304  INR 1.15   Cardiac Enzymes: No results for input(s): CKTOTAL, CKMB, CKMBINDEX, TROPONINI in the last 168 hours. BNP (last 3 results) No results for input(s): PROBNP in the last 8760 hours. HbA1C: No results for input(s): HGBA1C in  the last 72 hours. CBG: No results for input(s): GLUCAP in the last 168 hours. Lipid Profile: No results for input(s): CHOL, HDL, LDLCALC, TRIG, CHOLHDL, LDLDIRECT in the last 72 hours. Thyroid Function Tests: No results for input(s): TSH, T4TOTAL, FREET4, T3FREE, THYROIDAB in the last 72 hours. Anemia Panel: No results for input(s): VITAMINB12, FOLATE, FERRITIN, TIBC, IRON, RETICCTPCT in the last 72 hours. Urine analysis: No results found for: COLORURINE, APPEARANCEUR, LABSPEC, PHURINE, GLUCOSEU, HGBUR, BILIRUBINUR, KETONESUR, PROTEINUR, UROBILINOGEN, NITRITE, LEUKOCYTESUR Sepsis Labs: !!!!!!!!!!!!!!!!!!!!!!!!!!!!!!!!!!!!!!!!!!!! @LABRCNTIP (procalcitonin:4,lacticidven:4) )No results found for this or any previous visit (from the past 240 hour(s)).   Radiological Exams on Admission: Dg Chest 1 View  Result Date: 12/15/2016 CLINICAL DATA:  Post fall, now with right hip pain. Preoperative examination. EXAM: CHEST 1 VIEW COMPARISON:  None. FINDINGS: Normal cardiac silhouette and mediastinal contours. No focal parenchymal opacities. No supine evidence of pleural effusion or pneumothorax. No evidence of edema. No acute osseus abnormalities. IMPRESSION: No acute cardiopulmonary disease. Electronically Signed   By: Simonne Come M.D.   On: 12/15/2016 14:10   Dg Hip Unilat  With Pelvis 2-3 Views Right  Result Date: 12/15/2016 CLINICAL DATA:  Recent fall with right hip pain, initial encounter EXAM: DG HIP (WITH OR WITHOUT PELVIS) 2-3V RIGHT COMPARISON:  None. FINDINGS: Comminuted intratrochanteric fracture is noted with mild displacement. No significant impaction and angulation is seen. Pelvic ring is intact. No soft tissue abnormality is noted. IMPRESSION: Comminuted intratrochanteric right femoral fracture Electronically Signed   By: Alcide Clever M.D.   On: 12/15/2016 14:11   Dg Femur, Min 2 Views Right  Result Date: 12/15/2016 CLINICAL DATA:  Recent fall with known proximal femoral fracture EXAM:  RIGHT FEMUR 2 VIEWS COMPARISON:  None. FINDINGS: The comminuted intratrochanteric fracture is again identified. Degenerative changes are noted about the knee joint. No other femoral fracture is seen. No soft tissue abnormality is noted. IMPRESSION: Proximal right femoral fracture. The more distal femur is within normal limits with the exception of degenerative changes about the knee joint. Electronically Signed   By: Alcide Clever M.D.   On: 12/15/2016 14:11    EKG: Independently reviewed. Sinus bradycardia  Assessment/Plan Principal Problem:   Hip fracture (HCC) Active Problems:   Closed nondisplaced intertrochanteric fracture of right femur (HCC)   HTN (hypertension)   HLD (hyperlipidemia)     Closed nondisplaced intertrochanteric fracture of right femur - hip fracture order set - Dr. Romeo Apple of ortho consulted - pain control with morphine - CM and SW consulted - PT/OT after surgical fixation  HTN - continue bystolic  HLD - continue lipitor   DVT prophylaxis: heparin  Code Status: Full code  Family Communication: patient surrounded by three friends  Disposition Plan: likely home after surgery  Consults called: Orthopedic  Admission status: inpatient, med- surg   Katrinka Blazing MD [facility] Pager [phone]  If 7PM-7AM, please contact  night-coverage [email or web address] Password [facility]  12/15/2016, 6:25 PM

## 2016-12-15 NOTE — Progress Notes (Signed)
Pt educated on blood consent form; pt stated she would think about it tonight before signing it d/t her being worried about getting bad blood. Pt stated Dr. Romeo AppleHarrison was supposed to come in tonight and speak to her and he never showed. Pt states she wants to speak to Dr. Romeo AppleHarrison before signing blood consent; surgical consent signed.

## 2016-12-15 NOTE — ED Triage Notes (Signed)
Pt seen a snake in garage, tripped over something and fell landing on right hip.  20 G IV to left hand placed by [facility] and pt received 100 mcg Fentanyl.

## 2016-12-16 ENCOUNTER — Inpatient Hospital Stay (HOSPITAL_COMMUNITY): Payer: Medicare Other | Admitting: Anesthesiology

## 2016-12-16 ENCOUNTER — Encounter (HOSPITAL_COMMUNITY): Payer: Self-pay | Admitting: Anesthesiology

## 2016-12-16 ENCOUNTER — Inpatient Hospital Stay (HOSPITAL_COMMUNITY): Payer: Medicare Other

## 2016-12-16 ENCOUNTER — Encounter (HOSPITAL_COMMUNITY)
Admission: EM | Disposition: A | Discharge status: Skilled Nursing Facility | Payer: Self-pay | Source: Home / Self Care | Attending: Pulmonary Disease

## 2016-12-16 HISTORY — PX: INTRAMEDULLARY (IM) NAIL INTERTROCHANTERIC: SHX5875

## 2016-12-16 LAB — SURGICAL PCR SCREEN
MRSA, PCR: NEGATIVE
Staphylococcus aureus: NEGATIVE

## 2016-12-16 LAB — POTASSIUM: POTASSIUM: 3.7 mmol/L (ref 3.5–5.1)

## 2016-12-16 SURGERY — FIXATION, FRACTURE, INTERTROCHANTERIC, WITH INTRAMEDULLARY ROD
Anesthesia: Spinal | Site: Hip | Laterality: Right

## 2016-12-16 MED ORDER — METOCLOPRAMIDE HCL 10 MG PO TABS: 5.0000 mg | ORAL_TABLET | Freq: Three times a day (TID) | ORAL | Status: DC | PRN

## 2016-12-16 MED ORDER — LOSARTAN POTASSIUM 50 MG PO TABS
100.0000 mg | ORAL_TABLET | Freq: Every day | ORAL | Status: DC
  Administered 2016-12-16 – 2016-12-18 (×3): 100 mg via ORAL
  Filled 2016-12-16 (×3): qty 2

## 2016-12-16 MED ORDER — CEFAZOLIN SODIUM-DEXTROSE 2-4 GM/100ML-% IV SOLN
INTRAVENOUS | Status: AC
  Filled 2016-12-16: qty 100

## 2016-12-16 MED ORDER — BUPIVACAINE IN DEXTROSE 0.75-8.25 % IT SOLN
INTRATHECAL | Status: DC | PRN
  Administered 2016-12-16: 15 mg via INTRATHECAL

## 2016-12-16 MED ORDER — ACETAMINOPHEN 10 MG/ML IV SOLN
1000.0000 mg | Freq: Four times a day (QID) | INTRAVENOUS | Status: AC
Start: 2016-12-16 — End: 2016-12-17
  Administered 2016-12-16 – 2016-12-17 (×4): 1000 mg via INTRAVENOUS
  Filled 2016-12-16 (×4): qty 100

## 2016-12-16 MED ORDER — POTASSIUM CHLORIDE 10 MEQ/100ML IV SOLN
10.0000 meq | INTRAVENOUS | Status: DC
  Administered 2016-12-16 (×2): 10 meq via INTRAVENOUS
  Filled 2016-12-16 (×3): qty 100

## 2016-12-16 MED ORDER — MENTHOL 3 MG MT LOZG: 1.0000 | LOZENGE | OROMUCOSAL | Status: DC | PRN

## 2016-12-16 MED ORDER — ONDANSETRON HCL 4 MG PO TABS: 4.0000 mg | ORAL_TABLET | Freq: Four times a day (QID) | ORAL | Status: DC | PRN

## 2016-12-16 MED ORDER — ASPIRIN EC 325 MG PO TBEC
325.0000 mg | DELAYED_RELEASE_TABLET | Freq: Every day | ORAL | Status: DC
  Administered 2016-12-17 – 2016-12-18 (×2): 325 mg via ORAL
  Filled 2016-12-16 (×2): qty 1

## 2016-12-16 MED ORDER — CEFAZOLIN SODIUM-DEXTROSE 2-4 GM/100ML-% IV SOLN
2.0000 g | INTRAVENOUS | Status: AC
  Administered 2016-12-16: 2 g via INTRAVENOUS

## 2016-12-16 MED ORDER — LORATADINE 10 MG PO TABS
10.0000 mg | ORAL_TABLET | Freq: Every day | ORAL | Status: DC
  Administered 2016-12-16 – 2016-12-18 (×3): 10 mg via ORAL
  Filled 2016-12-16 (×3): qty 1

## 2016-12-16 MED ORDER — POTASSIUM CHLORIDE CRYS ER 20 MEQ PO TBCR
40.0000 meq | EXTENDED_RELEASE_TABLET | Freq: Once | ORAL | Status: AC
  Administered 2016-12-16: 40 meq via ORAL
  Filled 2016-12-16: qty 2

## 2016-12-16 MED ORDER — LOSARTAN POTASSIUM-HCTZ 100-25 MG PO TABS: 1.0000 | ORAL_TABLET | Freq: Every day | ORAL | Status: DC

## 2016-12-16 MED ORDER — SODIUM CHLORIDE 0.9 % IV SOLN
INTRAVENOUS | Status: DC
  Administered 2016-12-16 – 2016-12-17 (×3): via INTRAVENOUS

## 2016-12-16 MED ORDER — BUPIVACAINE-EPINEPHRINE (PF) 0.5% -1:200000 IJ SOLN
INTRAMUSCULAR | Status: AC
  Filled 2016-12-16: qty 60

## 2016-12-16 MED ORDER — FENTANYL CITRATE (PF) 100 MCG/2ML IJ SOLN
INTRAMUSCULAR | Status: AC
  Filled 2016-12-16: qty 2

## 2016-12-16 MED ORDER — ACETAMINOPHEN 10 MG/ML IV SOLN
INTRAVENOUS | Status: AC
  Filled 2016-12-16: qty 100

## 2016-12-16 MED ORDER — MIDAZOLAM HCL 5 MG/5ML IJ SOLN
INTRAMUSCULAR | Status: DC | PRN
  Administered 2016-12-16 (×3): 1 mg via INTRAVENOUS

## 2016-12-16 MED ORDER — HYDROCHLOROTHIAZIDE 25 MG PO TABS
25.0000 mg | ORAL_TABLET | Freq: Every day | ORAL | Status: DC
  Administered 2016-12-16 – 2016-12-18 (×3): 25 mg via ORAL
  Filled 2016-12-16 (×3): qty 1

## 2016-12-16 MED ORDER — TRAMADOL HCL 50 MG PO TABS
ORAL_TABLET | ORAL | Status: AC
  Filled 2016-12-16: qty 1

## 2016-12-16 MED ORDER — EPHEDRINE SULFATE 50 MG/ML IJ SOLN
INTRAMUSCULAR | Status: AC
  Filled 2016-12-16: qty 1

## 2016-12-16 MED ORDER — LACTATED RINGERS IV SOLN
INTRAVENOUS | Status: DC | PRN
  Administered 2016-12-16: 13:00:00 via INTRAVENOUS

## 2016-12-16 MED ORDER — TRAMADOL HCL 50 MG PO TABS
50.0000 mg | ORAL_TABLET | Freq: Once | ORAL | Status: AC
  Administered 2016-12-16: 50 mg via ORAL

## 2016-12-16 MED ORDER — SODIUM CHLORIDE 0.9 % IJ SOLN
INTRAMUSCULAR | Status: AC
  Filled 2016-12-16: qty 10

## 2016-12-16 MED ORDER — FENTANYL CITRATE (PF) 100 MCG/2ML IJ SOLN
INTRAMUSCULAR | Status: DC | PRN
  Administered 2016-12-16 (×4): 25 ug via INTRAVENOUS

## 2016-12-16 MED ORDER — CEFAZOLIN SODIUM-DEXTROSE 2-4 GM/100ML-% IV SOLN
2.0000 g | Freq: Four times a day (QID) | INTRAVENOUS | Status: AC
  Administered 2016-12-16 – 2016-12-17 (×2): 2 g via INTRAVENOUS
  Filled 2016-12-16 (×2): qty 100

## 2016-12-16 MED ORDER — METOCLOPRAMIDE HCL 5 MG/ML IJ SOLN: 5.0000 mg | Freq: Three times a day (TID) | INTRAMUSCULAR | Status: DC | PRN

## 2016-12-16 MED ORDER — NEBIVOLOL HCL 10 MG PO TABS
20.0000 mg | ORAL_TABLET | Freq: Every day | ORAL | Status: DC
  Administered 2016-12-17 – 2016-12-18 (×2): 20 mg via ORAL
  Filled 2016-12-16 (×2): qty 2

## 2016-12-16 MED ORDER — BUPIVACAINE IN DEXTROSE 0.75-8.25 % IT SOLN
INTRATHECAL | Status: AC
  Filled 2016-12-16: qty 2

## 2016-12-16 MED ORDER — 0.9 % SODIUM CHLORIDE (POUR BTL) OPTIME
TOPICAL | Status: DC | PRN
  Administered 2016-12-16: 1000 mL

## 2016-12-16 MED ORDER — BUPIVACAINE-EPINEPHRINE (PF) 0.5% -1:200000 IJ SOLN
INTRAMUSCULAR | Status: DC | PRN
  Administered 2016-12-16: 60 mL

## 2016-12-16 MED ORDER — MIDAZOLAM HCL 2 MG/2ML IJ SOLN
INTRAMUSCULAR | Status: AC
  Filled 2016-12-16: qty 2

## 2016-12-16 MED ORDER — ACETAMINOPHEN 10 MG/ML IV SOLN
INTRAVENOUS | Status: AC
  Filled 2016-12-16: qty 200

## 2016-12-16 MED ORDER — ACETAMINOPHEN 10 MG/ML IV SOLN
1000.0000 mg | Freq: Once | INTRAVENOUS | Status: AC
  Administered 2016-12-16: 1000 mg via INTRAVENOUS

## 2016-12-16 MED ORDER — PHENOL 1.4 % MT LIQD: 1.0000 | OROMUCOSAL | Status: DC | PRN

## 2016-12-16 MED ORDER — TRAMADOL HCL 50 MG PO TABS
50.0000 mg | ORAL_TABLET | Freq: Four times a day (QID) | ORAL | Status: DC
  Administered 2016-12-16 – 2016-12-18 (×6): 50 mg via ORAL
  Filled 2016-12-16 (×7): qty 1

## 2016-12-16 MED ORDER — EPHEDRINE SULFATE 50 MG/ML IJ SOLN
INTRAMUSCULAR | Status: DC | PRN
  Administered 2016-12-16 (×2): 10 mg via INTRAVENOUS

## 2016-12-16 MED ORDER — ONDANSETRON HCL 4 MG/2ML IJ SOLN
4.0000 mg | Freq: Four times a day (QID) | INTRAMUSCULAR | Status: DC | PRN
  Administered 2016-12-17: 4 mg via INTRAVENOUS
  Filled 2016-12-16: qty 2

## 2016-12-16 SURGICAL SUPPLY — 53 items
BAG HAMPER (MISCELLANEOUS) ×3 IMPLANT
BIT DRILL AO GAMMA 4.2X300 (BIT) ×3 IMPLANT
BLADE HEX COATED 2.75 (ELECTRODE) ×3 IMPLANT
BLADE SURG SZ10 CARB STEEL (BLADE) ×6 IMPLANT
BNDG GAUZE ELAST 4 BULKY (GAUZE/BANDAGES/DRESSINGS) ×3 IMPLANT
CHLORAPREP W/TINT 26ML (MISCELLANEOUS) ×3 IMPLANT
CLOTH BEACON ORANGE TIMEOUT ST (SAFETY) ×3 IMPLANT
COVER LIGHT HANDLE STERIS (MISCELLANEOUS) ×6 IMPLANT
COVER MAYO STAND XLG (DRAPE) ×3 IMPLANT
DECANTER SPIKE VIAL GLASS SM (MISCELLANEOUS) ×6 IMPLANT
DRAPE STERI IOBAN 125X83 (DRAPES) ×3 IMPLANT
DRESSING ALLEVYN LIFE SACRUM (GAUZE/BANDAGES/DRESSINGS) ×3 IMPLANT
DRSG MEPILEX BORDER 4X12 (GAUZE/BANDAGES/DRESSINGS) ×3 IMPLANT
DRSG PAD ABDOMINAL 8X10 ST (GAUZE/BANDAGES/DRESSINGS) ×3 IMPLANT
ELECT REM PT RETURN 9FT ADLT (ELECTROSURGICAL) ×3
ELECTRODE REM PT RTRN 9FT ADLT (ELECTROSURGICAL) ×1 IMPLANT
GLOVE BIO SURGEON STRL SZ7 (GLOVE) ×6 IMPLANT
GLOVE BIOGEL PI IND STRL 7.0 (GLOVE) ×2 IMPLANT
GLOVE BIOGEL PI INDICATOR 7.0 (GLOVE) ×4
GLOVE SKINSENSE NS SZ8.0 LF (GLOVE) ×2
GLOVE SKINSENSE STRL SZ8.0 LF (GLOVE) ×1 IMPLANT
GLOVE SS N UNI LF 8.5 STRL (GLOVE) ×3 IMPLANT
GOWN STRL REUS W/TWL LRG LVL3 (GOWN DISPOSABLE) ×6 IMPLANT
GOWN STRL REUS W/TWL XL LVL3 (GOWN DISPOSABLE) ×3 IMPLANT
GUIDEROD T2 3X1000 (ROD) ×3 IMPLANT
INST SET MAJOR BONE (KITS) ×3 IMPLANT
K-WIRE  3.2X450M STR (WIRE) ×2
K-WIRE 3.2X450M STR (WIRE) ×1
KIT BLADEGUARD II DBL (SET/KITS/TRAYS/PACK) ×3 IMPLANT
KIT ROOM TURNOVER AP CYSTO (KITS) ×3 IMPLANT
KWIRE 3.2X450M STR (WIRE) ×1 IMPLANT
MANIFOLD NEPTUNE II (INSTRUMENTS) ×3 IMPLANT
MARKER SKIN DUAL TIP RULER LAB (MISCELLANEOUS) ×3 IMPLANT
NAIL TROCH GAMMA 11X18 (Nail) ×3 IMPLANT
NEEDLE HYPO 21X1.5 SAFETY (NEEDLE) ×3 IMPLANT
NEEDLE SPNL 18GX3.5 QUINCKE PK (NEEDLE) ×3 IMPLANT
NS IRRIG 1000ML POUR BTL (IV SOLUTION) ×3 IMPLANT
PACK BASIC III (CUSTOM PROCEDURE TRAY) ×2
PACK SRG BSC III STRL LF ECLPS (CUSTOM PROCEDURE TRAY) ×1 IMPLANT
PENCIL HANDSWITCHING (ELECTRODE) ×3 IMPLANT
SCREW LAG GAMMA 3 TI 10.5X100M (Screw) ×3 IMPLANT
SCREW LOCKING T2 F/T  5X37.5MM (Screw) ×2 IMPLANT
SCREW LOCKING T2 F/T 5X37.5MM (Screw) ×1 IMPLANT
SET BASIN LINEN APH (SET/KITS/TRAYS/PACK) ×3 IMPLANT
SPONGE LAP 18X18 X RAY DECT (DISPOSABLE) ×6 IMPLANT
STAPLER VISISTAT 35W (STAPLE) ×6 IMPLANT
SUT BRALON NAB BRD #1 30IN (SUTURE) ×3 IMPLANT
SUT MNCRL 0 VIOLET CTX 36 (SUTURE) ×1 IMPLANT
SUT MON AB 2-0 CT1 36 (SUTURE) ×3 IMPLANT
SUT MONOCRYL 0 CTX 36 (SUTURE) ×2
SYR 30ML LL (SYRINGE) ×3 IMPLANT
SYR BULB IRRIGATION 50ML (SYRINGE) ×6 IMPLANT
YANKAUER SUCT BULB TIP NO VENT (SUCTIONS) ×3 IMPLANT

## 2016-12-16 NOTE — Brief Op Note (Signed)
12/15/2016 - 12/16/2016  2:21 PM  PATIENT:  Christella HartiganBetty D Narducci  81 y.o. female  PRE-OPERATIVE DIAGNOSIS:  right hip fracture  POST-OPERATIVE DIAGNOSIS:  Right intertrochanteric hip fracture   PROCEDURE:  Procedure(s): INTRAMEDULLARY (IM) NAIL INTERTROCHANTRIC (Right)   Gamma nail 125 with 100 mm lag screw and 37.5 distal locking screw, acorn in sliding mode   SURGEON:  Surgeon(s) and Role:    * Vickki HearingHarrison, Amberly Livas E, MD - Primary  PHYSICIAN ASSISTANT:   ASSISTANTS: none   ANESTHESIA:   spinal  EBL:  Total I/O In: 2479.2 [I.V.:2379.2; IV Piggyback:100] Out: 450 [Urine:400; Blood:50]  BLOOD ADMINISTERED:none  DRAINS: none   LOCAL MEDICATIONS USED:  MARCAINE     SPECIMEN:  No Specimen  DISPOSITION OF SPECIMEN:  N/A  COUNTS:  YES  TOURNIQUET:  * No tourniquets in log *  DICTATION: .Dragon Dictation  PLAN OF CARE: Admit to inpatient   PATIENT DISPOSITION:  PACU - hemodynamically stable.   Delay start of Pharmacological VTE agent (>24hrs) due to surgical blood loss or risk of bleeding: yes  [phone]

## 2016-12-16 NOTE — Addendum Note (Signed)
Addendum  created 12/16/16 1445 by Despina HiddenIdacavage, Mishon Blubaugh J, CRNA   Anesthesia Intra Flowsheets edited

## 2016-12-16 NOTE — Op Note (Signed)
12/16/2016  .12/15/2016 12:37 PM   Surgical dictation  Preop diagnosis intertrochanteric fracture right hip closed  Postop diagnosis same  Procedure open treatment internal fixation right hip fracture  Implants 125 gamma nail short, with a 100 mm lag screw, proximal acorn in sliding mode a distal screw 37.5 mm  Surgeon Romeo Apple there were no assistants  Anesthesia was by spinal technique estimated blood loss was 150 mL  Operative findings two-part stable intertrochanteric fracture right hip with intact posterior medial buttress  Surgery was done as follows:  The patient was identified in the preop area the surgical site was confirmed and marked chart update was completed  Patient was taken to the operating room where 2 g of Ancef were administered and spinal anesthetic was given. Patient was placed on the fracture table with a peroneal post the well leg was padded and placed in a well leg holder the operative leg right lower extremity was placed in a traction device  We brought the C-arm in. No reduction maneuver was really required. Once we put the leg into the traction device with the knee pointing north the fracture reduced. We did internally rotate a little bit to assist with entry point location which further reduce the fracture  After sterile prep and drape timeout was completed and the surgery was started  Incision was made over the greater trochanter extended proximally and extended down to the fascia which was split in line with the skin incision  Blunt dissection was carried down to the tip of the trochanter was felt that a curved awl was passed in the center the femoral canal at the tip of the greater trochanter  Once this was confirmed by x-ray a guidewire was placed down to the knee  The 125 nail was passed over the guidewire however the canal was tight in the nail had to be removed. We reamed with a 10 mm, 11 mm and 12 mm reamer and then the nail was passed over the  guidewire with ease.  Made a second incision distal to the first one divided the skin the cutaneous tissue muscle and use subperiosteal dissection to expose the femur. We put the cannula device into the proximal jig and then open the cortex with a drill bit. Once the depth of insertion was confirmed on the AP view we went to the lateral view adjusted our rotation to get the guidewire into the center the femoral head with the appropriate tip to apex distance  This measured 97.5 mm in terms of the guidewire. I took a 100 mm depth on the reamer and passed over the guidewire and then passed a 100 mm lag screw. After irrigating the proximal portion of the nail the acorn was placed followed by confirmation of engagement with the acorn in the reduced by one quarter position  Distal locking screw was placed through the Jake through a small stab incision  Final x-rays confirmed reduction and implant position  Wounds were irrigated copiously with saline. We injected the subfascial layer proximally with 30 mL of Marcaine and then the other 2 wounds with 15 mL of Marcaine each  I closed with #1 Bralon for the proximal fascial layer and then 0 Monocryl and then on the second wound we closed with 0 Monocryl and on the third one with 0 Monocryl followed by staples at each level  Sterile dressing was applied  The patient was then taken to the regular bed I checked her leg lengths and rotation I was  satisfied that they were restored  She was taken to recovery room stable condition  The postoperative plan  Hip fracture instructions for ORIF  Procedure open treatment internal fixation with  125 Stryker gamma nail, short with 100 mm lag screw acorn and 37.5 distal locking bolt  Weightbearing status as tolerated  Staples removed postop day #  12  Followup visit. Postop day  #12-17 X-rays please have x-rays performed. Postop day 14 (AND BEFORE 1ST VISIT TO DR Romeo AppleHARRISON ) DVT prophylaxis x 28 days with  aspirin

## 2016-12-16 NOTE — H&P (View-Only) (Signed)
IN HOSPITAL CONSULT   REQUESTED BY DR Juanetta GoslingHAWKINS AND LAMA  CHIEF COMPLAINT: RIGHT HIP PAIN X 1 DAY  81 YO FEMALE fell yesterday on June 2. Julia Choi saw a snake in her garage turn lost her balance fell on her right hip. Julia Choi cannot get up and her neighbor found her and called EMS Julia Choi was brought to the hospital. In the emergency room Julia Choi was found to have a right intertrochanteric hip fracture.  Julia Choi complains of constant dull right hip pain which is exacerbated by attempts at moving the right leg.   Review of Systems  Respiratory: Negative for shortness of breath.   Cardiovascular: Negative for chest pain.  Gastrointestinal: Negative for abdominal pain.  All other systems reviewed and are negative.  Past Medical History:  Diagnosis Date  . Hypertension   . Reflux    cholesterol, hyn   History reviewed. No pertinent surgical history. Julia Choi has not had surgery History reviewed. No pertinent family history. there is no history of bleeding disorder or anesthesia difficuty Social History  Substance Use Topics  . Smoking status: Never Smoker  . Smokeless tobacco: Never Used  . Alcohol use No     Current Facility-Administered Medications:  .  [COMPLETED] sodium chloride 0.9 % bolus 1,000 mL, 1,000 mL, Intravenous, Once, Stopped at 12/15/16 1522 **AND** 0.9 %  sodium chloride infusion, , Intravenous, Continuous, Jacalyn LefevreHaviland, Julie, MD, Last Rate: 125 mL/hr at 12/16/16 0655 .  atorvastatin (LIPITOR) tablet 10 mg, 10 mg, Oral, q1800, Filbert SchilderKadolph, Alexandria U, MD, 10 mg at 12/15/16 2041 .  chlorhexidine (HIBICLENS) 4 % liquid 4 application, 60 mL, Topical, Once, Vickki HearingHarrison, Ryland Tungate E, MD .  famotidine (PEPCID) tablet 20 mg, 20 mg, Oral, BID, Filbert SchilderKadolph, Alexandria U, MD, 20 mg at 12/15/16 2042 .  heparin injection 5,000 Units, 5,000 Units, Subcutaneous, Q8H, Kari BaarsHawkins, Edward, MD, Stopped at 12/16/16 0600 .  HYDROcodone-acetaminophen (NORCO/VICODIN) 5-325 MG per tablet 1-2 tablet, 1-2 tablet, Oral, Q6H PRN,  Filbert SchilderKadolph, Alexandria U, MD, 1 tablet at 12/15/16 2041 .  methocarbamol (ROBAXIN) tablet 500 mg, 500 mg, Oral, Q6H PRN **OR** methocarbamol (ROBAXIN) 500 mg in dextrose 5 % 50 mL IVPB, 500 mg, Intravenous, Q6H PRN, Debbra RidingKadolph, Alexandria U, MD .  morphine 2 MG/ML injection 0.5 mg, 0.5 mg, Intravenous, Q2H PRN, Filbert SchilderKadolph, Alexandria U, MD, 0.5 mg at 12/16/16 0657 .  nebivolol (BYSTOLIC) tablet 10 mg, 10 mg, Oral, Daily, Filbert SchilderKadolph, Alexandria U, MD, 10 mg at 12/15/16 2045 .  polyethylene glycol (MIRALAX / GLYCOLAX) packet 17 g, 17 g, Oral, Daily PRN, Filbert SchilderKadolph, Alexandria U, MD .  potassium chloride SA (K-DUR,KLOR-CON) CR tablet 40 mEq, 40 mEq, Oral, Once, Kari BaarsHawkins, Edward, MD .  povidone-iodine 10 % swab 2 application, 2 application, Topical, Once, Vickki HearingHarrison, Damario Gillie E, MD  BP (!) 102/56 (BP Location: Right Arm)   Pulse (!) 58   Temp 98.6 F (37 C) (Oral)   Resp 18   Ht 5\' 4"  (1.626 m)   Wt 150 lb (68 kg)   SpO2 97%   BMI 25.75 kg/m   Physical Exam  Constitutional: Julia Choi is oriented to person, place, and time. Julia Choi appears well-developed and well-nourished. No distress.  HENT:  Head: Normocephalic and atraumatic.  Eyes: EOM are normal. Pupils are equal, round, and reactive to light. Right eye exhibits no discharge. Left eye exhibits no discharge. No scleral icterus.  Neck: Normal range of motion. Neck supple. No JVD present. No tracheal deviation present.  Cardiovascular: Normal rate, regular rhythm and intact distal pulses.  Pulmonary/Chest: Effort normal. No stridor. No respiratory distress. Julia Choi exhibits no tenderness.  Abdominal: Soft. Julia Choi exhibits no distension and no mass. There is no tenderness. There is no guarding.  Lymphadenopathy:    Julia Choi has no cervical adenopathy.  Neurological: Julia Choi is alert and oriented to person, place, and time. Julia Choi displays normal reflexes. No sensory deficit. Julia Choi exhibits normal muscle tone. Coordination normal.  Skin: Skin is warm and dry. Capillary refill takes less  than 2 seconds. No rash noted. Julia Choi is not diaphoretic. No erythema. No pallor.  Psychiatric: Julia Choi has a normal mood and affect. Her behavior is normal. Judgment and thought content normal.   Left and right upper extremities  Normal alignment ROM normal  Motor strength 5/5 Stability all joint no subluxation Neuro vascular exam normal sensation and pulses  Skin normal  lymphnodes normal axilla    Left lower extremity Normal alignment ROM normal  Motor strength 5/5 Stability all joint no subluxation Neuro vascular exam normal sensation and pulses  Skin normal  Lymph nodes groin normal   Right lower extremity  abnormal alignment: external rotation of leg, tender greater trochanterr  ROM abnormal: can move hip , foot DF PF normal  Motor strength no strophy Stability all joint no subluxation visualized Neuro vascular exam normal sensation and pulses  Skin normal  Lymph nodes groin normal    BMP Latest Ref Rng & Units 12/15/2016  Glucose 65 - 99 mg/dL 161(W)  BUN 6 - 20 mg/dL 20  Creatinine 9.60 - 4.54 mg/dL 0.98(J)  Sodium 191 - 478 mmol/L 138  Potassium 3.5 - 5.1 mmol/L 3.1(L)  Chloride 101 - 111 mmol/L 103  CO2 22 - 32 mmol/L 27  Calcium 8.9 - 10.3 mg/dL 9.0   CBC Latest Ref Rng & Units 12/15/2016  WBC 4.0 - 10.5 K/uL 5.2  Hemoglobin 12.0 - 15.0 g/dL 11.1(L)  Hematocrit 36.0 - 46.0 % 31.7(L)  Platelets 150 - 400 K/uL 140(L)   CXR NORMAL   Diagnosis intertrochanteric fracture right hip closed, initial encounter  Hypokalemia  Plan potassium replacement, open treatment internal fixation right hip  This procedure has been fully reviewed with the patient and written informed consent has been obtained.  THIS IS WHAT WE DISCUSSED  You have decided to proceed with internal fixation of the right hip. Nonoperative treatment would not be beneficial to the patient in terms of preventing fracture disease and returning her to her premorbid state  We will perform the procedure  commonly known as internal fixation right hip. Some of the risks associated with this procedure  include but are not limited to Bleeding Infection Swelling Stiffness Blood clot Continued Pain Nonunion  hardware failure  LEG LENGTH INEQUALITY REQUIRING A SHOE LIFT Persistent limp

## 2016-12-16 NOTE — Consult Note (Signed)
IN HOSPITAL CONSULT   REQUESTED BY DR Juanetta GoslingHAWKINS AND LAMA  CHIEF COMPLAINT: RIGHT HIP PAIN X 1 DAY  81 YO FEMALE fell yesterday on June 2. She saw a snake in her garage turn lost her balance fell on her right hip. She cannot get up and her neighbor found her and called EMS she was brought to the hospital. In the emergency room she was found to have a right intertrochanteric hip fracture.  She complains of constant dull right hip pain which is exacerbated by attempts at moving the right leg.   Review of Systems  Respiratory: Negative for shortness of breath.   Cardiovascular: Negative for chest pain.  Gastrointestinal: Negative for abdominal pain.  All other systems reviewed and are negative.  Past Medical History:  Diagnosis Date  . Hypertension   . Reflux    cholesterol, hyn   History reviewed. No pertinent surgical history. she has not had surgery History reviewed. No pertinent family history. there is no history of bleeding disorder or anesthesia difficuty Social History  Substance Use Topics  . Smoking status: Never Smoker  . Smokeless tobacco: Never Used  . Alcohol use No     Current Facility-Administered Medications:  .  [COMPLETED] sodium chloride 0.9 % bolus 1,000 mL, 1,000 mL, Intravenous, Once, Stopped at 12/15/16 1522 **AND** 0.9 %  sodium chloride infusion, , Intravenous, Continuous, Jacalyn LefevreHaviland, Julie, MD, Last Rate: 125 mL/hr at 12/16/16 0655 .  atorvastatin (LIPITOR) tablet 10 mg, 10 mg, Oral, q1800, Filbert SchilderKadolph, Alexandria U, MD, 10 mg at 12/15/16 2041 .  chlorhexidine (HIBICLENS) 4 % liquid 4 application, 60 mL, Topical, Once, Vickki HearingHarrison, Stanley E, MD .  famotidine (PEPCID) tablet 20 mg, 20 mg, Oral, BID, Filbert SchilderKadolph, Alexandria U, MD, 20 mg at 12/15/16 2042 .  heparin injection 5,000 Units, 5,000 Units, Subcutaneous, Q8H, Kari BaarsHawkins, Edward, MD, Stopped at 12/16/16 0600 .  HYDROcodone-acetaminophen (NORCO/VICODIN) 5-325 MG per tablet 1-2 tablet, 1-2 tablet, Oral, Q6H PRN,  Filbert SchilderKadolph, Alexandria U, MD, 1 tablet at 12/15/16 2041 .  methocarbamol (ROBAXIN) tablet 500 mg, 500 mg, Oral, Q6H PRN **OR** methocarbamol (ROBAXIN) 500 mg in dextrose 5 % 50 mL IVPB, 500 mg, Intravenous, Q6H PRN, Debbra RidingKadolph, Alexandria U, MD .  morphine 2 MG/ML injection 0.5 mg, 0.5 mg, Intravenous, Q2H PRN, Filbert SchilderKadolph, Alexandria U, MD, 0.5 mg at 12/16/16 0657 .  nebivolol (BYSTOLIC) tablet 10 mg, 10 mg, Oral, Daily, Filbert SchilderKadolph, Alexandria U, MD, 10 mg at 12/15/16 2045 .  polyethylene glycol (MIRALAX / GLYCOLAX) packet 17 g, 17 g, Oral, Daily PRN, Filbert SchilderKadolph, Alexandria U, MD .  potassium chloride SA (K-DUR,KLOR-CON) CR tablet 40 mEq, 40 mEq, Oral, Once, Kari BaarsHawkins, Edward, MD .  povidone-iodine 10 % swab 2 application, 2 application, Topical, Once, Vickki HearingHarrison, Stanley E, MD  BP (!) 102/56 (BP Location: Right Arm)   Pulse (!) 58   Temp 98.6 F (37 C) (Oral)   Resp 18   Ht 5\' 4"  (1.626 m)   Wt 150 lb (68 kg)   SpO2 97%   BMI 25.75 kg/m   Physical Exam  Constitutional: She is oriented to person, place, and time. She appears well-developed and well-nourished. No distress.  HENT:  Head: Normocephalic and atraumatic.  Eyes: EOM are normal. Pupils are equal, round, and reactive to light. Right eye exhibits no discharge. Left eye exhibits no discharge. No scleral icterus.  Neck: Normal range of motion. Neck supple. No JVD present. No tracheal deviation present.  Cardiovascular: Normal rate, regular rhythm and intact distal pulses.  Pulmonary/Chest: Effort normal. No stridor. No respiratory distress. She exhibits no tenderness.  Abdominal: Soft. She exhibits no distension and no mass. There is no tenderness. There is no guarding.  Lymphadenopathy:    She has no cervical adenopathy.  Neurological: She is alert and oriented to person, place, and time. She displays normal reflexes. No sensory deficit. She exhibits normal muscle tone. Coordination normal.  Skin: Skin is warm and dry. Capillary refill takes less  than 2 seconds. No rash noted. She is not diaphoretic. No erythema. No pallor.  Psychiatric: She has a normal mood and affect. Her behavior is normal. Judgment and thought content normal.   Left and right upper extremities  Normal alignment ROM normal  Motor strength 5/5 Stability all joint no subluxation Neuro vascular exam normal sensation and pulses  Skin normal  lymphnodes normal axilla    Left lower extremity Normal alignment ROM normal  Motor strength 5/5 Stability all joint no subluxation Neuro vascular exam normal sensation and pulses  Skin normal  Lymph nodes groin normal   Right lower extremity  abnormal alignment: external rotation of leg, tender greater trochanterr  ROM abnormal: can move hip , foot DF PF normal  Motor strength no strophy Stability all joint no subluxation visualized Neuro vascular exam normal sensation and pulses  Skin normal  Lymph nodes groin normal    BMP Latest Ref Rng & Units 12/15/2016  Glucose 65 - 99 mg/dL 161(W)  BUN 6 - 20 mg/dL 20  Creatinine 9.60 - 4.54 mg/dL 0.98(J)  Sodium 191 - 478 mmol/L 138  Potassium 3.5 - 5.1 mmol/L 3.1(L)  Chloride 101 - 111 mmol/L 103  CO2 22 - 32 mmol/L 27  Calcium 8.9 - 10.3 mg/dL 9.0   CBC Latest Ref Rng & Units 12/15/2016  WBC 4.0 - 10.5 K/uL 5.2  Hemoglobin 12.0 - 15.0 g/dL 11.1(L)  Hematocrit 36.0 - 46.0 % 31.7(L)  Platelets 150 - 400 K/uL 140(L)   CXR NORMAL   Diagnosis intertrochanteric fracture right hip closed, initial encounter  Hypokalemia  Plan potassium replacement, open treatment internal fixation right hip  This procedure has been fully reviewed with the patient and written informed consent has been obtained.  THIS IS WHAT WE DISCUSSED  You have decided to proceed with internal fixation of the right hip. Nonoperative treatment would not be beneficial to the patient in terms of preventing fracture disease and returning her to her premorbid state  We will perform the procedure  commonly known as internal fixation right hip. Some of the risks associated with this procedure  include but are not limited to Bleeding Infection Swelling Stiffness Blood clot Continued Pain Nonunion  hardware failure  LEG LENGTH INEQUALITY REQUIRING A SHOE LIFT Persistent limp

## 2016-12-16 NOTE — Anesthesia Preprocedure Evaluation (Addendum)
Anesthesia Evaluation  Patient identified by MRN, date of birth, ID band Patient awake    Reviewed: Allergy & Precautions, NPO status , Patient's Chart, lab work & pertinent test results, reviewed documented beta blocker date and time   Airway Mallampati: II  TM Distance: >3 FB Neck ROM: Full    Dental  (+) Teeth Intact   Pulmonary neg pulmonary ROS,    breath sounds clear to auscultation       Cardiovascular Exercise Tolerance: Good hypertension, Pt. on medications and Pt. on home beta blockers  Rhythm:Regular Rate:Normal     Neuro/Psych    GI/Hepatic GERD  Controlled,  Endo/Other    Renal/GU      Musculoskeletal  (+) Arthritis ,   Abdominal (+)  Abdomen: soft. Bowel sounds: normal.  Peds  Hematology   Anesthesia Other Findings   Reproductive/Obstetrics                            Anesthesia Physical Anesthesia Plan  ASA: II and emergent  Anesthesia Plan: Spinal   Post-op Pain Management:    Induction:   Airway Management Planned: Simple Face Mask  Additional Equipment:   Intra-op Plan:   Post-operative Plan:   Informed Consent:   Plan Discussed with: Anesthesiologist, CRNA and Surgeon  Anesthesia Plan Comments:         Anesthesia Quick Evaluation

## 2016-12-16 NOTE — Anesthesia Procedure Notes (Signed)
Spinal  Start time: 12/16/2016 12:56 PM Staffing Resident/CRNA: Joycelyn ManIDACAVAGE, Shevy Yaney J Preanesthetic Checklist Completed: patient identified, site marked, surgical consent, pre-op evaluation, timeout performed, IV checked, risks and benefits discussed and monitors and equipment checked Spinal Block Patient position: right lateral decubitus Prep: Betadine Patient monitoring: heart rate, cardiac monitor, continuous pulse ox and blood pressure Approach: midline Location: L3-4 Injection technique: single-shot Needle Needle type: Spinocan  Needle gauge: 22 G Needle length: 5 cm Assessment Sensory level: T6 Additional Notes [id number]         09/20

## 2016-12-16 NOTE — Transfer of Care (Signed)
Immediate Anesthesia Transfer of Care Note  Patient: Julia Choi  Procedure(s) Performed: Procedure(s): INTRAMEDULLARY (IM) NAIL INTERTROCHANTRIC (Right)  Patient Location: PACU  Anesthesia Type:Spinal  Level of Consciousness: awake and patient cooperative  Airway & Oxygen Therapy: Patient Spontanous Breathing and Patient connected to face mask oxygen  Post-op Assessment: Report given to RN and Post -op Vital signs reviewed and stable  Post vital signs: Reviewed and stable  Last Vitals:  Vitals:   12/16/16 0945 12/16/16 1152  BP: (!) 107/50 (!) 121/54  Pulse: (!) 53 (!) 55  Resp: 18 (!) 24  Temp: 36.8 C 36.9 C    Last Pain:  Vitals:   12/16/16 0945  TempSrc: Oral  PainSc:          Complications: No apparent anesthesia complications

## 2016-12-16 NOTE — Progress Notes (Signed)
Subjective: This is a 81 year old who fell and broke her right hip. She is scheduled for surgery later today. She has no complaints except for pain. Her breathing is okay. No chest pain. Her potassium was low when I don't see that it was replaced so I will do that this morning  Objective: Vital signs in last 24 hours: Temp:  [98.4 F (36.9 C)-99.5 F (37.5 C)] 98.6 F (37 C) (06/03 0700) Pulse Rate:  [46-89] 58 (06/03 0700) Resp:  [9-23] 18 (06/03 0700) BP: (102-147)/(51-75) 102/56 (06/03 0700) SpO2:  [95 %-100 %] 97 % (06/03 0700) Weight:  [68 kg (150 lb)] 68 kg (150 lb) (06/02 1800) Weight change:  Last BM Date: 12/14/16  Intake/Output from previous day: 06/02 0701 - 06/03 0700 In: 1833.3 [P.O.:400; I.V.:1433.3] Out: 1150 [Urine:1150]  PHYSICAL EXAM General appearance: alert, cooperative and mild distress Resp: clear to auscultation bilaterally Cardio: regular rate and rhythm, S1, S2 normal, no murmur, click, rub or gallop GI: soft, non-tender; bowel sounds normal; no masses,  no organomegaly Extremities: She has right hip fracture Skin warm and dry.  Lab Results:  Results for orders placed or performed during the hospital encounter of 12/15/16 (from the past 48 hour(s))  Basic metabolic panel     Status: Abnormal   Collection Time: 12/15/16  1:04 PM  Result Value Ref Range   Sodium 138 135 - 145 mmol/L   Potassium 3.1 (L) 3.5 - 5.1 mmol/L   Chloride 103 101 - 111 mmol/L   CO2 27 22 - 32 mmol/L   Glucose, Bld 167 (H) 65 - 99 mg/dL   BUN 20 6 - 20 mg/dL   Creatinine, Ser 1.08 (H) 0.44 - 1.00 mg/dL   Calcium 9.0 8.9 - 10.3 mg/dL   GFR calc non Af Amer 48 (L) >60 mL/min   GFR calc Af Amer 55 (L) >60 mL/min    Comment: (NOTE) The eGFR has been calculated using the CKD EPI equation. This calculation has not been validated in all clinical situations. eGFR's persistently <60 mL/min signify possible Chronic Kidney Disease.    Anion gap 8 5 - 15  CBC WITH DIFFERENTIAL      Status: Abnormal   Collection Time: 12/15/16  1:04 PM  Result Value Ref Range   WBC 5.2 4.0 - 10.5 K/uL   RBC 3.48 (L) 3.87 - 5.11 MIL/uL   Hemoglobin 11.1 (L) 12.0 - 15.0 g/dL   HCT 31.7 (L) 36.0 - 46.0 %   MCV 91.1 78.0 - 100.0 fL   MCH 31.9 26.0 - 34.0 pg   MCHC 35.0 30.0 - 36.0 g/dL   RDW 12.9 11.5 - 15.5 %   Platelets 140 (L) 150 - 400 K/uL   Neutrophils Relative % 65 %   Neutro Abs 3.4 1.7 - 7.7 K/uL   Lymphocytes Relative 31 %   Lymphs Abs 1.6 0.7 - 4.0 K/uL   Monocytes Relative 4 %   Monocytes Absolute 0.2 0.1 - 1.0 K/uL   Eosinophils Relative 0 %   Eosinophils Absolute 0.0 0.0 - 0.7 K/uL   Basophils Relative 0 %   Basophils Absolute 0.0 0.0 - 0.1 K/uL  Protime-INR     Status: None   Collection Time: 12/15/16  1:04 PM  Result Value Ref Range   Prothrombin Time 14.7 11.4 - 15.2 seconds   INR 1.15   Type and screen [facility]     Status: None (Preliminary result)   Collection Time: 12/15/16  1:04 PM  Result Value Ref Range   ABO/RH(D) O POS    Antibody Screen NEG    Sample Expiration 12/18/2016    Unit Number [id number]    Blood Component Type RED CELLS,LR    Unit division 00    Status of Unit ALLOCATED    Transfusion Status OK TO TRANSFUSE    Crossmatch Result Compatible    Unit Number [id number]    Blood Component Type RED CELLS,LR    Unit division 00    Status of Unit ALLOCATED    Transfusion Status OK TO TRANSFUSE    Crossmatch Result Compatible   Prepare RBC     Status: None   Collection Time: 12/15/16  1:04 PM  Result Value Ref Range   Order Confirmation ORDER PROCESSED BY BLOOD BANK   ABO/Rh     Status: None   Collection Time: 12/15/16  1:04 PM  Result Value Ref Range   ABO/RH(D) O POS   Urinalysis, Routine w reflex microscopic     Status: Abnormal   Collection Time: 12/15/16  8:30 PM  Result Value Ref Range   Color, Urine STRAW (A) YELLOW   APPearance CLEAR CLEAR   Specific Gravity, Urine 1.009 1.005 - 1.030   pH 6.0 5.0 - 8.0    Glucose, UA NEGATIVE NEGATIVE mg/dL   Hgb urine dipstick NEGATIVE NEGATIVE   Bilirubin Urine NEGATIVE NEGATIVE   Ketones, ur NEGATIVE NEGATIVE mg/dL   Protein, ur NEGATIVE NEGATIVE mg/dL   Nitrite NEGATIVE NEGATIVE   Leukocytes, UA NEGATIVE NEGATIVE  Surgical pcr screen     Status: None   Collection Time: 12/15/16 11:00 PM  Result Value Ref Range   MRSA, PCR NEGATIVE NEGATIVE   Staphylococcus aureus NEGATIVE NEGATIVE    Comment:        The Xpert SA Assay (FDA approved for NASAL specimens in patients over 69 years of age), is one component of a comprehensive surveillance program.  Test performance has been validated by [facility] for patients greater than or equal to 69 year old. It is not intended to diagnose infection nor to guide or monitor treatment.     ABGS No results for input(s): PHART, PO2ART, TCO2, HCO3 in the last 72 hours.  Invalid input(s): PCO2 CULTURES Recent Results (from the past 240 hour(s))  Surgical pcr screen     Status: None   Collection Time: 12/15/16 11:00 PM  Result Value Ref Range Status   MRSA, PCR NEGATIVE NEGATIVE Final   Staphylococcus aureus NEGATIVE NEGATIVE Final    Comment:        The Xpert SA Assay (FDA approved for NASAL specimens in patients over 3 years of age), is one component of a comprehensive surveillance program.  Test performance has been validated by [facility] for patients greater than or equal to 32 year old. It is not intended to diagnose infection nor to guide or monitor treatment.    Studies/Results: Dg Chest 1 View  Result Date: 12/15/2016 CLINICAL DATA:  Post fall, now with right hip pain. Preoperative examination. EXAM: CHEST 1 VIEW COMPARISON:  None. FINDINGS: Normal cardiac silhouette and mediastinal contours. No focal parenchymal opacities. No supine evidence of pleural effusion or pneumothorax. No evidence of edema. No acute osseus abnormalities. IMPRESSION: No acute cardiopulmonary disease.  Electronically Signed   By: Sandi Mariscal M.D.   On: 12/15/2016 14:10   Dg Hip Unilat  With Pelvis 2-3 Views Right  Result Date: 12/15/2016 CLINICAL DATA:  Recent fall with right hip pain, initial  encounter EXAM: DG HIP (WITH OR WITHOUT PELVIS) 2-3V RIGHT COMPARISON:  None. FINDINGS: Comminuted intratrochanteric fracture is noted with mild displacement. No significant impaction and angulation is seen. Pelvic ring is intact. No soft tissue abnormality is noted. IMPRESSION: Comminuted intratrochanteric right femoral fracture Electronically Signed   By: Inez Catalina M.D.   On: 12/15/2016 14:11   Dg Femur, Min 2 Views Right  Result Date: 12/15/2016 CLINICAL DATA:  Recent fall with known proximal femoral fracture EXAM: RIGHT FEMUR 2 VIEWS COMPARISON:  None. FINDINGS: The comminuted intratrochanteric fracture is again identified. Degenerative changes are noted about the knee joint. No other femoral fracture is seen. No soft tissue abnormality is noted. IMPRESSION: Proximal right femoral fracture. The more distal femur is within normal limits with the exception of degenerative changes about the knee joint. Electronically Signed   By: Inez Catalina M.D.   On: 12/15/2016 14:11    Medications:  Prior to Admission:  Prescriptions Prior to Admission  Medication Sig Dispense Refill Last Dose  . atorvastatin (LIPITOR) 10 MG tablet Take 10 mg by mouth every evening.    12/14/2016 at Unknown time  . cetirizine (ZYRTEC) 10 MG tablet Take 10 mg by mouth daily as needed for allergies.   Past Week at Unknown time  . losartan-hydrochlorothiazide (HYZAAR) 100-25 MG tablet Take 1 tablet by mouth daily.   12/15/2016 at Unknown time  . Nebivolol HCl (BYSTOLIC) 20 MG TABS Take 20 mg by mouth daily.   12/15/2016 at Unknown time  . ranitidine (ZANTAC) 150 MG tablet Take 150 mg by mouth 2 (two) times daily.   12/15/2016 at Unknown time   Scheduled: . atorvastatin  10 mg Oral q1800  . chlorhexidine  60 mL Topical Once  . famotidine   20 mg Oral BID  . heparin subcutaneous  5,000 Units Subcutaneous Q8H  . nebivolol  10 mg Oral Daily  . povidone-iodine  2 application Topical Once   Continuous: . sodium chloride 125 mL/hr at 12/16/16 0655  . methocarbamol (ROBAXIN)  IV     WER:[id number], methocarbamol **OR** methocarbamol (ROBAXIN)  IV, morphine injection, polyethylene glycol  Assesment: She has a hip fracture. She is scheduled for surgery later today and I will give her potassium to see if we can get that up before the surgery is done. She has hypertension which is well controlled. She has sleep apnea and has been noncompliant with CPAP Principal Problem:   Hip fracture (HCC) Active Problems:   Closed nondisplaced intertrochanteric fracture of right femur (HCC)   HTN (hypertension)   HLD (hyperlipidemia)    Plan: Potassium replacement today    LOS: 1 day   Moo Gravley L 12/16/2016, 8:32 AM

## 2016-12-16 NOTE — Interval H&P Note (Signed)
History and Physical Interval Note:  12/16/2016 12:25 PM  Julia Choi  has presented today for surgery, with the diagnosis of right hip fracture  The various methods of treatment have been discussed with the patient and family. After consideration of risks, benefits and other options for treatment, the patient has consented to  Procedure(s): INTRAMEDULLARY (IM) NAIL INTERTROCHANTRIC (Right) as a surgical intervention .  The patient's history has been reviewed, patient examined, no change in status, stable for surgery.  I have reviewed the patient's chart and labs.  Questions were answered to the patient's satisfaction.     Fuller CanadaStanley Larena [place]

## 2016-12-16 NOTE — Anesthesia Postprocedure Evaluation (Signed)
Anesthesia Post Note  Patient: Julia Choi  Procedure(s) Performed: Procedure(s) (LRB): INTRAMEDULLARY (IM) NAIL INTERTROCHANTRIC (Right)  Patient location during evaluation: PACU Anesthesia Type: Spinal Level of consciousness: awake and alert, oriented and patient cooperative Pain management: pain level controlled Vital Signs Assessment: post-procedure vital signs reviewed and stable Respiratory status: spontaneous breathing, nonlabored ventilation and respiratory function stable Cardiovascular status: blood pressure returned to baseline Postop Assessment: no signs of nausea or vomiting Anesthetic complications: no     Last Vitals:  Vitals:   12/16/16 1152 12/16/16 1430  BP: (!) 121/54 (!) 129/54  Pulse: (!) 55   Resp: (!) 24   Temp: 36.9 C 36.9 C    Last Pain:  Vitals:   12/16/16 1430  TempSrc:   PainSc: 0-No pain                 Tinie Mcgloin J

## 2016-12-17 LAB — CBC
HEMATOCRIT: 26.6 % — AB (ref 36.0–46.0)
Hemoglobin: 9.2 g/dL — ABNORMAL LOW (ref 12.0–15.0)
MCH: 31.7 pg (ref 26.0–34.0)
MCHC: 34.6 g/dL (ref 30.0–36.0)
MCV: 91.7 fL (ref 78.0–100.0)
PLATELETS: 117 10*3/uL — AB (ref 150–400)
RBC: 2.9 MIL/uL — ABNORMAL LOW (ref 3.87–5.11)
RDW: 13 % (ref 11.5–15.5)
WBC: 7 10*3/uL (ref 4.0–10.5)

## 2016-12-17 LAB — BASIC METABOLIC PANEL
Anion gap: 6 (ref 5–15)
BUN: 11 mg/dL (ref 6–20)
CALCIUM: 8.2 mg/dL — AB (ref 8.9–10.3)
CO2: 24 mmol/L (ref 22–32)
CREATININE: 1.08 mg/dL — AB (ref 0.44–1.00)
Chloride: 108 mmol/L (ref 101–111)
GFR calc Af Amer: 55 mL/min — ABNORMAL LOW (ref >60)
GFR calc non Af Amer: 48 mL/min — ABNORMAL LOW (ref >60)
GLUCOSE: 127 mg/dL — AB (ref 65–99)
Potassium: 3.6 mmol/L (ref 3.5–5.1)
Sodium: 138 mmol/L (ref 135–145)

## 2016-12-17 NOTE — Progress Notes (Signed)
PT Cancellation Note  Patient Details Name: Julia HartiganBetty D Krupp MRN: [id number] DOB: 08-Jun-1936   Cancelled Treatment:    Reason Eval/Treat Not Completed: Patient at procedure or test/unavailable (Attempted BID treatment twice, pt with RN and then RD. Will attempt PT treatment again at later date/time as available. )  1:44 PM, 12/17/16 Rosamaria LintsAllan C Matheu Ploeger, PT, DPT Physical Therapist - [place] [phone](ASCOM)  [phone] (Office)    Bryce Kimble C 12/17/2016, 1:44 PM

## 2016-12-17 NOTE — Anesthesia Postprocedure Evaluation (Signed)
Anesthesia Post Note  Patient: Julia HartiganBetty D Choi  Procedure(s) Performed: Procedure(s) (LRB): INTRAMEDULLARY (IM) NAIL INTERTROCHANTRIC (Right)  Patient location during evaluation: Nursing Unit Anesthesia Type: Spinal Level of consciousness: awake and alert, oriented and patient cooperative Pain management: pain level controlled Vital Signs Assessment: post-procedure vital signs reviewed and stable Respiratory status: spontaneous breathing, nonlabored ventilation and respiratory function stable Cardiovascular status: blood pressure returned to baseline Postop Assessment: no signs of nausea or vomiting, adequate PO intake, patient able to bend at knees and no backache Anesthetic complications: no     Last Vitals:  Vitals:   12/17/16 0330 12/17/16 0730  BP: (!) 95/45 (!) 101/52  Pulse: 66 60  Resp: 16 18  Temp: 36.9 C 36.9 C    Last Pain:  Vitals:   12/17/16 0730  TempSrc: Oral  PainSc:                  Jc Veron J

## 2016-12-17 NOTE — [place] FL2 (Signed)
[place] MEDICAID FL2 LEVEL OF CARE SCREENING TOOL     IDENTIFICATION  Patient Name: Julia Choi Birthdate: 07/10/1936 Sex: female Admission Date (Current Location): 12/15/2016  [facility] and [place] Number:  [employer] and Address:  [facility],  [address] [place] [address]      Provider Number: [id number]  Attending Physician Name and Address:  Kari Baars, MD  Relative Name and Phone Number:       Current Level of Care: Hospital Recommended Level of Care: Skilled Nursing Facility Prior Approval Number:    Date Approved/Denied:   PASRR Number:    Discharge Plan: SNF    Current Diagnoses: Patient Active Problem List   Diagnosis Date Noted  . Hip fracture (HCC) 12/15/2016  . Closed nondisplaced intertrochanteric fracture of right femur (HCC) 12/15/2016  . HTN (hypertension) 12/15/2016  . HLD (hyperlipidemia) 12/15/2016  . MENISCUS TEAR, LEFT 08/31/2010  . KNEE, ARTHRITIS, DEGEN./OSTEO 12/01/2009  . KNEE PAIN 12/01/2009    Orientation RESPIRATION BLADDER Height & Weight     Self, Time, Situation, Place  Normal Continent Weight: 150 lb (68 kg) Height:  5\' 4"  (162.6 cm)  BEHAVIORAL SYMPTOMS/MOOD NEUROLOGICAL BOWEL NUTRITION STATUS      Continent Diet (See DC summary)  AMBULATORY STATUS COMMUNICATION OF NEEDS Skin   Extensive Assist Verbally Surgical wounds, Skin abrasions, Bruising                       Personal Care Assistance Level of Assistance  Bathing, Feeding, Dressing Bathing Assistance: Limited assistance Feeding assistance: Independent Dressing Assistance: Limited assistance     Functional Limitations Info  Hearing, Speech, Sight Sight Info: Adequate Hearing Info: Adequate Speech Info: Adequate    SPECIAL CARE FACTORS FREQUENCY  PT (By licensed PT), OT (By licensed OT)     PT Frequency: 5x OT Frequency: 5x            Contractures Contractures Info: Not present    Additional Factors Info   Code Status, Allergies Code Status Info: Full Code Allergies Info: Predisone           Current Medications (12/17/2016):  This is the current hospital active medication list Current Facility-Administered Medications  Medication Dose Route Frequency Provider Last Rate Last Dose  . 0.9 %  sodium chloride infusion   Intravenous Continuous Vickki Hearing, MD 75 mL/hr at 12/17/16 [id number]    . acetaminophen (OFIRMEV) IV 1,000 mg  1,000 mg Intravenous Q6H Vickki Hearing, MD 400 mL/hr at 12/17/16 0852 1,000 mg at 12/17/16 0852  . aspirin EC tablet 325 mg  325 mg Oral Q breakfast Vickki Hearing, MD   325 mg at 12/17/16 1478  . atorvastatin (LIPITOR) tablet 10 mg  10 mg Oral q1800 Filbert Schilder, MD   10 mg at 12/16/16 1805  . famotidine (PEPCID) tablet 20 mg  20 mg Oral BID Filbert Schilder, MD   20 mg at 12/17/16 0853  . losartan (COZAAR) tablet 100 mg  100 mg Oral Daily Vickki Hearing, MD   100 mg at 12/17/16 2956   And  . hydrochlorothiazide (HYDRODIURIL) tablet 25 mg  25 mg Oral Daily Vickki Hearing, MD   25 mg at 12/17/16 2130  . HYDROcodone-acetaminophen (NORCO/VICODIN) 5-325 MG per tablet 1-2 tablet  1-2 tablet Oral Q6H PRN Filbert Schilder, MD   1 tablet at 12/16/16 2142  . loratadine (CLARITIN) tablet 10 mg  10 mg Oral  Daily Vickki HearingHarrison, Stanley E, MD   10 mg at 12/17/16 [id number]  . menthol-cetylpyridinium (CEPACOL) lozenge 3 mg  1 lozenge Oral PRN Vickki HearingHarrison, Stanley E, MD       Or  . phenol (CHLORASEPTIC) mouth spray 1 spray  1 spray Mouth/Throat PRN Vickki HearingHarrison, Stanley E, MD      . methocarbamol (ROBAXIN) tablet 500 mg  500 mg Oral Q6H PRN Filbert SchilderKadolph, Alexandria U, MD   500 mg at 12/16/16 2142   Or  . methocarbamol (ROBAXIN) 500 mg in dextrose 5 % 50 mL IVPB  500 mg Intravenous Q6H PRN Filbert SchilderKadolph, Alexandria U, MD      . metoCLOPramide (REGLAN) tablet 5-10 mg  5-10 mg Oral Q8H PRN Vickki HearingHarrison, Stanley E, MD       Or  . metoCLOPramide (REGLAN) injection 5-10 mg  5-10  mg Intravenous Q8H PRN Vickki HearingHarrison, Stanley E, MD      . morphine 2 MG/ML injection 0.5 mg  0.5 mg Intravenous Q2H PRN Filbert SchilderKadolph, Alexandria U, MD   0.5 mg at 12/17/16 1039  . nebivolol (BYSTOLIC) tablet 10 mg  10 mg Oral Daily Filbert SchilderKadolph, Alexandria U, MD   10 mg at 12/17/16 0855  . nebivolol (BYSTOLIC) tablet 20 mg  20 mg Oral Daily Vickki HearingHarrison, Stanley E, MD   20 mg at 12/17/16 0853  . ondansetron (ZOFRAN) tablet 4 mg  4 mg Oral Q6H PRN Vickki HearingHarrison, Stanley E, MD       Or  . ondansetron [facility](ZOFRAN) injection 4 mg  4 mg Intravenous Q6H PRN Vickki HearingHarrison, Stanley E, MD   4 mg at 12/17/16 1039  . polyethylene glycol (MIRALAX / GLYCOLAX) packet 17 g  17 g Oral Daily PRN Filbert SchilderKadolph, Alexandria U, MD      . traMADol Janean Sark(ULTRAM) tablet 50 mg  50 mg Oral Q6H Vickki HearingHarrison, Stanley E, MD   50 mg at 12/17/16 0002     Discharge Medications: Please see discharge summary for a list of discharge medications.  Relevant Imaging Results:  Relevant Lab Results:   Additional Information SSN:   [id number]  Raye SorrowCoble, Zakiah Beckerman N, LCSW

## 2016-12-17 NOTE — Progress Notes (Signed)
Subjective: She says she feels okay. Her surgery went well yesterday. She is anxious to get started on physical therapy. No other new complaints. She is not short of breath. No chest pain. No abdominal pain nausea or vomiting  Objective: Vital signs in last 24 hours: Temp:  [97.8 F (36.6 C)-100.6 F (38.1 C)] 98.4 F (36.9 C) (06/04 0730) Pulse Rate:  [49-84] 60 (06/04 0730) Resp:  [12-24] 18 (06/04 0730) BP: (95-133)/(45-107) 101/52 (06/04 0730) SpO2:  [90 %-100 %] 92 % (06/04 0730) Weight change:  Last BM Date: 12/14/16  Intake/Output from previous day: 06/03 0701 - 06/04 0700 In: 4152.9 [P.O.:240; I.V.:3512.9; IV Piggyback:400] Out: 1250 [Urine:1100; Blood:150]  PHYSICAL EXAM General appearance: alert, cooperative and no distress Resp: clear to auscultation bilaterally Cardio: regular rate and rhythm, S1, S2 normal, no murmur, click, rub or gallop GI: soft, non-tender; bowel sounds normal; no masses,  no organomegaly Extremities: Surgical site looks okay Skin warm and dry  Lab Results:  Results for orders placed or performed during the hospital encounter of 12/15/16 (from the past 48 hour(s))  Basic metabolic panel     Status: Abnormal   Collection Time: 12/15/16  1:04 PM  Result Value Ref Range   Sodium 138 135 - 145 mmol/L   Potassium 3.1 (L) 3.5 - 5.1 mmol/L   Chloride 103 101 - 111 mmol/L   CO2 27 22 - 32 mmol/L   Glucose, Bld 167 (H) 65 - 99 mg/dL   BUN 20 6 - 20 mg/dL   Creatinine, Ser 1.08 (H) 0.44 - 1.00 mg/dL   Calcium 9.0 8.9 - 10.3 mg/dL   GFR calc non Af Amer 48 (L) >60 mL/min   GFR calc Af Amer 55 (L) >60 mL/min    Comment: (NOTE) The eGFR has been calculated using the CKD EPI equation. This calculation has not been validated in all clinical situations. eGFR's persistently <60 mL/min signify possible Chronic Kidney Disease.    Anion gap 8 5 - 15  CBC WITH DIFFERENTIAL     Status: Abnormal   Collection Time: 12/15/16  1:04 PM  Result Value Ref  Range   WBC 5.2 4.0 - 10.5 K/uL   RBC 3.48 (L) 3.87 - 5.11 MIL/uL   Hemoglobin 11.1 (L) 12.0 - 15.0 g/dL   HCT 31.7 (L) 36.0 - 46.0 %   MCV 91.1 78.0 - 100.0 fL   MCH 31.9 26.0 - 34.0 pg   MCHC 35.0 30.0 - 36.0 g/dL   RDW 12.9 11.5 - 15.5 %   Platelets 140 (L) 150 - 400 K/uL   Neutrophils Relative % 65 %   Neutro Abs 3.4 1.7 - 7.7 K/uL   Lymphocytes Relative 31 %   Lymphs Abs 1.6 0.7 - 4.0 K/uL   Monocytes Relative 4 %   Monocytes Absolute 0.2 0.1 - 1.0 K/uL   Eosinophils Relative 0 %   Eosinophils Absolute 0.0 0.0 - 0.7 K/uL   Basophils Relative 0 %   Basophils Absolute 0.0 0.0 - 0.1 K/uL  Protime-INR     Status: None   Collection Time: 12/15/16  1:04 PM  Result Value Ref Range   Prothrombin Time 14.7 11.4 - 15.2 seconds   INR 1.15   Type and screen [facility]     Status: None (Preliminary result)   Collection Time: 12/15/16  1:04 PM  Result Value Ref Range   ABO/RH(D) O POS    Antibody Screen NEG    Sample Expiration 12/18/2016  Unit Number [id number]    Blood Component Type RED CELLS,LR    Unit division 00    Status of Unit ALLOCATED    Transfusion Status OK TO TRANSFUSE    Crossmatch Result Compatible    Unit Number [id number]    Blood Component Type RED CELLS,LR    Unit division 00    Status of Unit ALLOCATED    Transfusion Status OK TO TRANSFUSE    Crossmatch Result Compatible   Prepare RBC     Status: None   Collection Time: 12/15/16  1:04 PM  Result Value Ref Range   Order Confirmation ORDER PROCESSED BY BLOOD BANK   ABO/Rh     Status: None   Collection Time: 12/15/16  1:04 PM  Result Value Ref Range   ABO/RH(D) O POS   Urinalysis, Routine w reflex microscopic     Status: Abnormal   Collection Time: 12/15/16  8:30 PM  Result Value Ref Range   Color, Urine STRAW (A) YELLOW   APPearance CLEAR CLEAR   Specific Gravity, Urine 1.009 1.005 - 1.030   pH 6.0 5.0 - 8.0   Glucose, UA NEGATIVE NEGATIVE mg/dL   Hgb urine dipstick NEGATIVE  NEGATIVE   Bilirubin Urine NEGATIVE NEGATIVE   Ketones, ur NEGATIVE NEGATIVE mg/dL   Protein, ur NEGATIVE NEGATIVE mg/dL   Nitrite NEGATIVE NEGATIVE   Leukocytes, UA NEGATIVE NEGATIVE  Surgical pcr screen     Status: None   Collection Time: 12/15/16 11:00 PM  Result Value Ref Range   MRSA, PCR NEGATIVE NEGATIVE   Staphylococcus aureus NEGATIVE NEGATIVE    Comment:        The Xpert SA Assay (FDA approved for NASAL specimens in patients over 23 years of age), is one component of a comprehensive surveillance program.  Test performance has been validated by [facility] for patients greater than or equal to 33 year old. It is not intended to diagnose infection nor to guide or monitor treatment.   Potassium     Status: None   Collection Time: 12/16/16  3:31 PM  Result Value Ref Range   Potassium 3.7 3.5 - 5.1 mmol/L  CBC     Status: Abnormal   Collection Time: 12/17/16  5:02 AM  Result Value Ref Range   WBC 7.0 4.0 - 10.5 K/uL   RBC 2.90 (L) 3.87 - 5.11 MIL/uL   Hemoglobin 9.2 (L) 12.0 - 15.0 g/dL   HCT 26.6 (L) 36.0 - 46.0 %   MCV 91.7 78.0 - 100.0 fL   MCH 31.7 26.0 - 34.0 pg   MCHC 34.6 30.0 - 36.0 g/dL   RDW 13.0 11.5 - 15.5 %   Platelets 117 (L) 150 - 400 K/uL    Comment: SPECIMEN CHECKED FOR CLOTS PLATELET COUNT CONFIRMED BY SMEAR   Basic metabolic panel     Status: Abnormal   Collection Time: 12/17/16  5:02 AM  Result Value Ref Range   Sodium 138 135 - 145 mmol/L   Potassium 3.6 3.5 - 5.1 mmol/L   Chloride 108 101 - 111 mmol/L   CO2 24 22 - 32 mmol/L   Glucose, Bld 127 (H) 65 - 99 mg/dL   BUN 11 6 - 20 mg/dL   Creatinine, Ser 1.08 (H) 0.44 - 1.00 mg/dL   Calcium 8.2 (L) 8.9 - 10.3 mg/dL   GFR calc non Af Amer 48 (L) >60 mL/min   GFR calc Af Amer 55 (L) >60 mL/min    Comment: (NOTE) The eGFR  has been calculated using the CKD EPI equation. This calculation has not been validated in all clinical situations. eGFR's persistently <60 mL/min signify possible  Chronic Kidney Disease.    Anion gap 6 5 - 15    ABGS No results for input(s): PHART, PO2ART, TCO2, HCO3 in the last 72 hours.  Invalid input(s): PCO2 CULTURES Recent Results (from the past 240 hour(s))  Surgical pcr screen     Status: None   Collection Time: 12/15/16 11:00 PM  Result Value Ref Range Status   MRSA, PCR NEGATIVE NEGATIVE Final   Staphylococcus aureus NEGATIVE NEGATIVE Final    Comment:        The Xpert SA Assay (FDA approved for NASAL specimens in patients over 59 years of age), is one component of a comprehensive surveillance program.  Test performance has been validated by [facility] for patients greater than or equal to 27 year old. It is not intended to diagnose infection nor to guide or monitor treatment.    Studies/Results: Dg Chest 1 View  Result Date: 12/15/2016 CLINICAL DATA:  Post fall, now with right hip pain. Preoperative examination. EXAM: CHEST 1 VIEW COMPARISON:  None. FINDINGS: Normal cardiac silhouette and mediastinal contours. No focal parenchymal opacities. No supine evidence of pleural effusion or pneumothorax. No evidence of edema. No acute osseus abnormalities. IMPRESSION: No acute cardiopulmonary disease. Electronically Signed   By: Sandi Mariscal M.D.   On: 12/15/2016 14:10   Dg Pelvis Portable  Result Date: 12/16/2016 CLINICAL DATA:  ORIF right femur fracture. EXAM: PORTABLE PELVIS 1-2 VIEWS COMPARISON:  12/15/2016 radiographs FINDINGS: Internal nail and screw noted traversing a right intertrochanteric fracture, in near-anatomic alignment and position. No definite complicating features noted. IMPRESSION: ORIF right intertrochanteric femur fracture, in near-anatomic alignment and position. Electronically Signed   By: Margarette Canada M.D.   On: 12/16/2016 14:50   Dg Hip Operative Unilat With Pelvis Right  Result Date: 12/16/2016 CLINICAL DATA:  ORIF intertrochanteric fracture. EXAM: OPERATIVE right HIP (WITH PELVIS IF PERFORMED) 7 VIEWS  TECHNIQUE: Fluoroscopic spot image(s) were submitted for interpretation post-operatively. COMPARISON:  12/15/2016 FINDINGS: Multiple C-arm images show gamma nail fixation of an intertrochanteric fracture with a distal locking screw. Components appear well positioned and alignment appears anatomic. IMPRESSION: Gamma nail ORIF of intertrochanteric fracture. Electronically Signed   By: Nelson Chimes M.D.   On: 12/16/2016 14:17   Dg Hip Unilat  With Pelvis 2-3 Views Right  Result Date: 12/15/2016 CLINICAL DATA:  Recent fall with right hip pain, initial encounter EXAM: DG HIP (WITH OR WITHOUT PELVIS) 2-3V RIGHT COMPARISON:  None. FINDINGS: Comminuted intratrochanteric fracture is noted with mild displacement. No significant impaction and angulation is seen. Pelvic ring is intact. No soft tissue abnormality is noted. IMPRESSION: Comminuted intratrochanteric right femoral fracture Electronically Signed   By: Inez Catalina M.D.   On: 12/15/2016 14:11   Dg Femur, Min 2 Views Right  Result Date: 12/15/2016 CLINICAL DATA:  Recent fall with known proximal femoral fracture EXAM: RIGHT FEMUR 2 VIEWS COMPARISON:  None. FINDINGS: The comminuted intratrochanteric fracture is again identified. Degenerative changes are noted about the knee joint. No other femoral fracture is seen. No soft tissue abnormality is noted. IMPRESSION: Proximal right femoral fracture. The more distal femur is within normal limits with the exception of degenerative changes about the knee joint. Electronically Signed   By: Inez Catalina M.D.   On: 12/15/2016 14:11    Medications:  Prior to Admission:  Prescriptions Prior to Admission  Medication  Sig Dispense Refill Last Dose  . atorvastatin (LIPITOR) 10 MG tablet Take 10 mg by mouth every evening.    12/14/2016 at Unknown time  . cetirizine (ZYRTEC) 10 MG tablet Take 10 mg by mouth daily as needed for allergies.   Past Week at Unknown time  . losartan-hydrochlorothiazide (HYZAAR) 100-25 MG tablet  Take 1 tablet by mouth daily.   12/15/2016 at Unknown time  . Nebivolol HCl (BYSTOLIC) 20 MG TABS Take 20 mg by mouth daily.   12/15/2016 at Unknown time  . ranitidine (ZANTAC) 150 MG tablet Take 150 mg by mouth 2 (two) times daily.   12/15/2016 at Unknown time   Scheduled: . aspirin EC  325 mg Oral Q breakfast  . atorvastatin  10 mg Oral q1800  . famotidine  20 mg Oral BID  . losartan  100 mg Oral Daily   And  . hydrochlorothiazide  25 mg Oral Daily  . loratadine  10 mg Oral Daily  . nebivolol  10 mg Oral Daily  . nebivolol  20 mg Oral Daily  . traMADol  50 mg Oral Q6H   Continuous: . sodium chloride 75 mL/hr at 12/17/16 0808  . acetaminophen Stopped (12/17/16 2423)  . methocarbamol (ROBAXIN)  IV     NTI:[id number], menthol-cetylpyridinium **OR** phenol, methocarbamol **OR** methocarbamol (ROBAXIN)  IV, metoCLOPramide **OR** metoCLOPramide (REGLAN) injection, morphine injection, ondansetron **OR** ondansetron (ZOFRAN) IV, polyethylene glycol  Assesment: She has had a hip fracture and had surgery for that yesterday. She says she feels better but wants to get started with physical therapy. She has hypertension and that's pre-well controlled. Her potassium had been low and that is better. Her hemoglobin level has gone down a little bit but not enough that she needs a blood transfusion Principal Problem:   Hip fracture ([place]) Active Problems:   Closed nondisplaced intertrochanteric fracture of right femur (HCC)   HTN (hypertension)   HLD (hyperlipidemia)    Plan: Continue current treatments    LOS: 2 days   Anastasya Jewell L 12/17/2016, 8:16 AM

## 2016-12-17 NOTE — Addendum Note (Signed)
Addendum  created 12/17/16 1034 by Despina HiddenIdacavage, Meleane Selinger J, CRNA   Sign clinical note

## 2016-12-17 NOTE — Clinical Social Work Note (Signed)
Clinical Social Work Assessment  Patient Details  Name: Julia Choi MRN: [id number] Date of Birth: May 23, 1936  Date of referral:  12/17/16               Reason for consult:  Facility Placement, Discharge Planning                Permission sought to share information with:  Case Manager, Magazine features editoracility Contact Representative, Family Supports Permission granted to share information::  Yes, Verbal Permission Granted  Name::        Agency::     Relationship::     Contact Information:     Housing/Transportation Living arrangements for the past 2 months:  Single Family Home Source of Information:  Patient, Medical Team, Case Manager, Adult Children Patient Interpreter Needed:  None Criminal Activity/Legal Involvement Pertinent to Current Situation/Hospitalization:  No - Comment as needed Significant Relationships:  Other Family Members, Friend Lives with:  Self Do you feel safe going back to the place where you live?  No Need for family participation in patient care:  No (Coment)  Care giving concerns:  Patient reports she lives at home and was working in her garden when she lifted a tarp and a black snake came towards her.  Reports she was afraid, attempted to run and fell and broke her hip.  [facility]   Patient currently is extensive assist secondary to pain.  Reports she is a 2 person assist.   Office managerocial Worker assessment / plan:  LCSW completed consult for facility placement. [facility] to recommendation of PT [facility] is [facility].  Will await and review bed offers with patient.   Plan: SNF at DC.  Employment status:  Retired Database administratornsurance information:  Managed Medicare PT Recommendations:  Skilled Nursing Facility Information / Referral to community resources:    SNF  Patient/Family's Response to care:  Agreeable to plan.  Patient/Family's Understanding of and Emotional Response  to Diagnosis, Current Treatment, and Prognosis:  Understanding and agreeable to recommendation for continued therapy.  Emotional Assessment Appearance:  Appears stated age Attitude/Demeanor/Rapport:    Affect (typically observed):  Accepting, Adaptable Orientation:  Oriented to Self, Oriented to Place, Oriented to  Time, Oriented to Situation Alcohol / Substance use:  Not Applicable Psych involvement (Current and /or in the community):  No (Comment)  Discharge Needs  Concerns to be addressed:  No discharge needs identified Readmission within the last 30 days:  No Current discharge risk:  None Barriers to Discharge:  No Barriers Identified   Raye SorrowCoble, Nazia Rhines N, LCSW 12/17/2016, 11:06 AM

## 2016-12-17 NOTE — Care Management Important Message (Signed)
Important Message  Patient Details  Name: Julia HartiganBetty D Sommerfield MRN: [id number] Date of Birth: 1935/08/21   Medicare Important Message Given:  Yes    Deneshia Zucker, Chrystine OilerSharley Diane, RN 12/17/2016, 1:56 PM

## 2016-12-17 NOTE — Care Management Note (Signed)
Case Management Note  Patient Details  Name: Christella HartiganBetty D Kapuscinski MRN: [id number] Date of Birth: 1936/07/14  Subjective/Objective:     Adm with hip fracture. From home alone. Recommended for SNF. Workup in process.                Action/Plan: CSW making arrangements for SNF post DC.    Expected Discharge Date:          12/19/2016        Expected Discharge Plan:  Skilled Nursing Facility  In-House Referral:  Clinical Social Work  Discharge planning Services  CM Consult  Post Acute Care Choice:    Choice offered to:     DME Arranged:    DME Agency:     HH Arranged:    HH Agency:     Status of Service:  In process, will continue to follow  If discussed at Long Length of Stay Meetings, dates discussed:    Additional Comments:  Mckinley Olheiser, Chrystine OilerSharley Diane, RN 12/17/2016, 1:54 PM

## 2016-12-17 NOTE — Progress Notes (Signed)
Initial Nutrition Assessment  DOCUMENTATION CODES:   Not applicable  INTERVENTIONS:  Ensure Enlive po daily, each supplement provides 350 kcal and 20 grams of protein    NUTRITION DIAGNOSIS:   Increased protein needs related to acute injury (right hip fx) / s/p ORIF    GOAL:   Patient will meet greater than or equal to 90% of their needs Pt to meet >/= 90% of their estimated nutrition needs      MONITOR:  Diet advancement, po intake, labs and wt trends  Supplement acceptance, PO intake  REASON FOR ASSESSMENT:   Consult Hip fracture protocol  ASSESSMENT:   Ms Eliberto Ivoryustin lives independently. Fell at home and suffered right hip fx. She is s/p ORIF and diet is being tolerated.   Her home diet is Regular and she denies changes in appetite. She usually eats Special K cereal for breakfast with milk and 2 cups coffee. Lunch is often a sandwich and dinner varies. She doesn't take vitamins or drink nutrition supplements. Based on her diet hx her protein intake is inadequate. Reviewed sources of protein and encouraged her to consume 25-30 gr three times daily to meet est needs.  NFPE: mild muscle loss deltoid   Recent Labs Lab 12/15/16 1304 12/16/16 1531 12/17/16 0502  NA 138  --  138  K 3.1* 3.7 3.6  CL 103  --  108  CO2 27  --  24  BUN 20  --  11  CREATININE 1.08*  --  1.08*  CALCIUM 9.0  --  8.2*  GLUCOSE 167*  --  127*   Labs: Glu 127   Meds: reviewed  Diet Order:  Diet regular Room service appropriate? Yes; Fluid consistency: Thin  Skin:   (surgical incision  right hip)   Last BM:  6/1   Height:   Ht Readings from Last 1 Encounters:  12/15/16 5\' 4"  (1.626 m)    Weight:   Wt Readings from Last 1 Encounters:  12/15/16 150 lb (68 kg)    Ideal Body Weight:  55 kg  BMI:  Body mass index is 25.75 kg/m.  Estimated Nutritional Needs:   Kcal:  [id number] (MSJ x 1.2-1.3 AF)  Protein:  81-88 gr (1.2-1.3 gr/kg)  Fluid:  1.4-1.5 liters daily  fluid  EDUCATION NEEDS:   Education needs addressed (reviewed importance of protein intake to promote healing and repair damaged  skin. Sources Eggs, Fishm Lean beef, pork MIlk, yogurt, dry beans.)  Royann ShiversLynn Gissela Bloch MS,RD,CSG,LDN Office: [phone] Pager: [phone]

## 2016-12-17 NOTE — Evaluation (Signed)
Occupational Therapy Evaluation Patient Details Name: Julia Choi MRN: [id number] DOB: 1935-11-21 Today's Date: 12/17/2016    History of Present Illness 81 y/o female fell on June 2. She saw a snake in her garage turn lost her balance fell on her right hip. She cannot get up and her neighbor found her and called EMS she was brought to the hospital. In the emergency room she was found to have a right intertrochanteric hip fracture. Pt underwent a Right hip ORIF on 12/16/16.   Clinical Impression   Pt in bed upon therapy arrival and agreeable to participate in OT evaluation. Patient presents with functional strength in BUE. Requires increased assistance with BADL due to recent surgery. Recommend patient discharge to SNF to increase functional performance during ADL tasks and allow for a safe return home.     Follow Up Recommendations  SNF    Equipment Recommendations  None recommended by OT       Precautions / Restrictions Precautions Precautions: Fall Precaution Comments: Due to recent surgery and fall at home. Restrictions Weight Bearing Restrictions: No RLE Weight Bearing: Weight bearing as tolerated      Mobility Bed Mobility Overal bed mobility: Needs Assistance Bed Mobility: Supine to Sit     Supine to sit: HOB elevated;Total assist        Transfers Overall transfer level: Needs assistance Equipment used: Rolling walker (2 wheeled) Transfers: Sit to/from [employer] Sit to Stand: Min assist Stand pivot transfers: Min assist                ADL either performed or assessed with clinical judgement   ADL Overall ADL's : Needs assistance/impaired                 Upper Body Dressing : Total assistance;Bed level Upper Body Dressing Details (indicate cue type and reason): Assist with donning hosptial socks.     Toilet Transfer: Minimal assistance;RW;Ambulation;BSC;Adhering to hip precautions;Cueing for sequencing;Cueing for safety    Toileting- Clothing Manipulation and Hygiene: Moderate assistance;Sitting/lateral lean;Adhering to hip precautions;Cueing for sequencing;Cueing for safety       Functional mobility during ADLs: Minimal assistance;Cueing for safety;Cueing for sequencing;Rolling walker       Vision Baseline Vision/History: Wears glasses Wears Glasses: Reading only Patient Visual Report: No change from baseline Vision Assessment?: No apparent visual deficits            Pertinent Vitals/Pain Pain Assessment: No/denies pain (Pt did have pain once standing on leg)     Hand Dominance Left   Extremity/Trunk Assessment Upper Extremity Assessment Upper Extremity Assessment: Overall WFL for tasks assessed   Lower Extremity Assessment Lower Extremity Assessment: Defer to PT evaluation       Communication Communication Communication: No difficulties   Cognition Arousal/Alertness: Awake/alert Behavior During Therapy: WFL for tasks assessed/performed Overall Cognitive Status: Within Functional Limits for tasks assessed                  Home Living Family/patient expects to be discharged to:: Skilled nursing facility Living Arrangements: Alone            Prior Functioning/Environment Level of Independence: Independent        Comments: Pt is very active at home. Able to complete all housekeeping, meal prep, bathing, and dressing.                  OT Goals(Current goals can be found in the care plan section) Acute Rehab OT Goals Patient Stated Goal:  To get stronger and be able to go home.      AM-PAC PT "6 Clicks" Daily Activity     Outcome Measure Help from another person eating meals?: None Help from another person taking care of personal grooming?: None Help from another person toileting, which includes using toliet, bedpan, or urinal?: A Lot Help from another person bathing (including washing, rinsing, drying)?: A Lot Help from another person to put on and taking off  regular upper body clothing?: None Help from another person to put on and taking off regular lower body clothing?: A Lot 6 Click Score: 18   End of Session Equipment Utilized During Treatment: Gait belt;Rolling walker  Activity Tolerance: Patient tolerated treatment well Patient left: in chair;with call bell/phone within reach;with chair alarm set  OT Visit Diagnosis: Muscle weakness (generalized) (M62.81)                Time: 4098-11910912-0935 OT Time Calculation (min): 23 min Charges:  OT General Charges $OT Visit: 1 Procedure OT Evaluation $OT Eval Low Complexity: 1 Procedure G-Codes: OT G-codes **NOT FOR INPATIENT CLASS** Functional Assessment Tool Used: AM-PAC 6 Clicks Daily Activity Functional Limitation: Self care Self Care Current Status (Y7829(G8987): At least 40 percent but less than 60 percent impaired, limited or restricted Self Care Goal Status (F6213(G8988): At least 40 percent but less than 60 percent impaired, limited or restricted Self Care Discharge Status [id number]([id number]): At least 40 percent but less than 60 percent impaired, limited or restricted   [name], OTR/L,CBIS  [phone]   [name], [name] 12/17/2016, 11:32 AM

## 2016-12-17 NOTE — Progress Notes (Signed)
Subjective: POD1 IM NAIL RIGHT HIP    Objective: Vital signs in last 24 hours: Temp:  [97.8 F (36.6 C)-100.6 F (38.1 C)] 98.5 F (36.9 C) (06/04 0330) Pulse Rate:  [49-84] 66 (06/04 0330) Resp:  [12-24] 16 (06/04 0330) BP: (95-133)/(45-107) 95/45 (06/04 0330) SpO2:  [90 %-100 %] 95 % (06/04 0635)  Intake/Output from previous day: 06/03 0701 - 06/04 0700 In: 4152.9 [P.O.:240; I.V.:3512.9; IV Piggyback:400] Out: 1250 [Urine:1100; Blood:150] Intake/Output this shift: No intake/output data recorded.   Recent Labs  12/15/16 1304 12/17/16 0502  HGB 11.1* 9.2*    Recent Labs  12/15/16 1304 12/17/16 0502  WBC 5.2 7.0  RBC 3.48* 2.90*  HCT 31.7* 26.6*  PLT 140* 117*    Recent Labs  12/15/16 1304 12/16/16 1531 12/17/16 0502  NA 138  --  138  K 3.1* 3.7 3.6  CL 103  --  108  CO2 27  --  24  BUN 20  --  11  CREATININE 1.08*  --  1.08*  GLUCOSE 167*  --  127*  CALCIUM 9.0  --  8.2*    Recent Labs  12/15/16 1304  INR 1.15   Assessment/Plan: START PT  CHECK FOR HYPOVOLEMIA  MONITOR POTASSIUM    Julia Choi 12/17/2016, 7:51 AM

## 2016-12-17 NOTE — Progress Notes (Addendum)
LCSW sent referral via fax as [facility]. Discussed with patient prior to sending and patient verbally consented to sending as she requested this facility as her late husband received care before he passed away.  Patient signed consent form:  Request to Disclose information for referral. Family in room as witness to patient agreement. Call placed to admissions to notify of referral. Information faxed and LCSW will follow up.  Will follow up with referral once reviewed.  Deretha EmoryHannah Ajahni Nay LCSW, MSW Clinical Social Work: Optician, dispensingystem [employer] Coverage for :  [phone]

## 2016-12-17 NOTE — Progress Notes (Addendum)
Physical Therapy Treatment Patient Details Name: Julia HartiganBetty D Choi MRN: [id number] DOB: 1936-02-06 Today's Date: 12/18/2016    History of Present Illness Julia BuckerBetty Choi is a 81yo white female who while cleaning garage at home, sustained a fall and fracture to Rt hip. [facility] adn underwent ORIF on 6/3, is now POD1 and WBAT on RLE. Pt was previously a fully independent community dwelling adult, living alone.     PT Comments    Pt in bed upon arrival with family members present.  Pt able to complete bed mobility with cues to use Lt LE and glute but required assistance with Rt LE due to pain with movement.  Pt able to demonstrate the exercises as instructed and reports she has been doing these all day.  Pt able to ambulate to door and back to bed (approx 30 feet) with antalgic gait.  Pt did c/o pain around 5/10 in Rt LE with weight bearing.  PT returned to bed with visitors present.  No increase pain or signs of discomfort at end of session.  Overall, progressing well.    Follow Up Recommendations  SNF;Supervision for mobility/OOB     Equipment Recommendations       Recommendations for Other Services       Precautions / Restrictions Precautions Precautions: Fall Precaution Comments: Due to recent surgery and fall at home. Restrictions RLE Weight Bearing: Weight bearing as tolerated    Mobility  Bed Mobility Overal bed mobility: Needs Assistance Bed Mobility: Supine to Sit     Supine to sit: Max assist     General bed mobility comments: taught ankle hook with LLE, but pt with significant truncal weakness and still requires heavy assistance.   Transfers Overall transfer level: Needs assistance Equipment used: Rolling walker (2 wheeled) Transfers: Sit to/from Stand (2x) Sit to Stand: From elevated surface;Min guard         General transfer comment: modA from BSC (standard height surface)   Ambulation/Gait   Ambulation Distance (Feet): 20 Feet Assistive device:  Rolling walker (2 wheeled) Gait Pattern/deviations: Step-to pattern;Antalgic   Gait velocity interpretation: <1.8 ft/sec, indicative of risk for recurrent falls General Gait Details: dizziness remains, pain worsening over time, and nausea worsening with noted orthostatic vitals after 10 feet.          Cognition Arousal/Alertness: Awake/alert Behavior During Therapy: WFL for tasks assessed/performed Overall Cognitive Status: Within Functional Limits for tasks assessed                                        Exercises General Exercises - Lower Extremity Ankle Circles/Pumps: AROM;Both;20 reps;Supine Quad Sets: AROM;Right;Supine;15 reps Gluteal Sets: Right;AROM;Supine;15 reps Short Arc Quad: Supine;Right;15 reps;AROM Heel Slides: AAROM;Right;Supine;15 reps Hip ABduction/ADduction: Right;Supine;AAROM;15 reps Straight Leg Raises: Right;10 reps;Supine;AAROM (bent knee raise ) Low Level/ICU Exercises Stabilized Bridging: AROM;Both;10 reps;Supine Other Exercises Other Exercises: pillow squeeze: 1x10         Pertinent Vitals/Pain Pain Assessment: 0-10 Pain Score: 5  Pain Intervention(s): Limited activity within patient's tolerance;Monitored during session;Premedicated before session    Home Living                      Prior Function            PT Goals (current goals can now be found in the care plan section) Acute Rehab PT Goals Patient Stated Goal: improve  strength and mobilty. Decrease pain.  PT Goal Formulation: With patient Time For Goal Achievement: 12/31/16 Potential to Achieve Goals: Good Progress towards PT goals: Progressing toward goals    Frequency    BID      PT Plan Current plan remains appropriate       AM-PAC PT "6 Clicks" Daily Activity  Outcome Measure  Difficulty turning over in bed (including adjusting bedclothes, sheets and blankets)?: Total Difficulty moving from lying on back to sitting on the side of the bed? :  Total Difficulty sitting down on and standing up from a chair with arms (e.g., wheelchair, bedside commode, etc,.)?: A Lot Help needed moving to and from a bed to chair (including a wheelchair)?: Total Help needed walking in hospital room?: A Lot Help needed climbing 3-5 steps with a railing? : Total 6 Click Score: 8    End of Session Equipment Utilized During Treatment: Gait belt Activity Tolerance: Patient tolerated treatment well;Patient limited by fatigue;Treatment limited secondary to medical complications (Comment) (nausea orthostatic BP during gait) Patient left: in chair;with call bell/phone within reach;with chair alarm set Nurse Communication: Mobility status (viitals) PT Visit Diagnosis: Muscle weakness (generalized) (M62.81);Difficulty in walking, not elsewhere classified (R26.2)     Time: 1355-1420 PT Time Calculation (min) (ACUTE ONLY): 25 min  Charges:  $gait: 8-22 mins $Therapeutic Activity: 8-22 mins               Lurena Nida, PTA/CLT [phone]  12/18/2016, 1:07 PM

## 2016-12-17 NOTE — Evaluation (Signed)
Physical Therapy Evaluation Patient Details Name: Julia Choi MRN: [id number] DOB: [birth year] Today's Date: 12/17/2016   History of Present Illness  Julia Choi is a 81yo white female who while cleaning garage at home, sustained a fall and fracture to Rt hip. She came to [facility] on 6/2 adn underwent ORIF on 6/3, is now POD1 and WBAT on RLE. Pt was previously a fully independent community dwelling adult, living alone.   Clinical Impression  Pt admitted with above diagnosis. Pt currently with functional limitations due to the deficits listed below (see "PT Problem List"). Upon entry, the patient is received semirecumbent in chair, no family/caregiver present. The pt is awake and agreeable to participate. No acute distress noted at this time, pt only reporting some mild, nonconcerning pain. VSS during eval. The pt is alert and oriented x3, pleasant, conversational, and following simple and multi-step commands consistently. Strength/MMT screening reveals RLE strength as 3-/5, with strong pain inhibition. Pt requires min-modA for completion of bed exercises. Functional mobility assessment demonstrates moderate weakness, the pt now requiring total A+2 for bed mobility, MinA for transfers, and CGA for gait, whereas the patient performs these independently at baseline. AMB is limited to BR and back only. Pt will benefit from skilled PT intervention to increase independence and safety with basic mobility in preparation for discharge to the venue listed below.       Follow Up Recommendations SNF;Supervision for mobility/OOB    Equipment Recommendations   (TBD by receiving facility)    Recommendations for Other Services       Precautions / Restrictions Precautions Precautions: Fall Precaution Comments: Due to recent surgery and fall at home. Restrictions Weight Bearing Restrictions: No RLE Weight Bearing: Weight bearing as tolerated      Mobility  Bed Mobility Overal bed mobility: Needs  Assistance Bed Mobility: Sit to Supine     Supine to sit: HOB elevated;Total assist Sit to supine: HOB elevated;+2 for safety/equipment;Total assist   General bed mobility comments: limited capacity to perform scooting or limb maangement in bed as well.   Transfers Overall transfer level: Needs assistance Equipment used: Rolling walker (2 wheeled) Transfers: Sit to/from Stand Sit to Stand: Min assist Stand pivot transfers: Min assist          Ambulation/Gait Ambulation/Gait assistance: Min guard Ambulation Distance (Feet): 10 Feet Assistive device: Rolling walker (2 wheeled) Gait Pattern/deviations: WFL(Within Functional Limits)   Gait velocity interpretation: <1.8 ft/sec, indicative of risk for recurrent falls General Gait Details: greatly limited by pain in limb and BUE fatigue   Stairs            Wheelchair Mobility    Modified Rankin (Stroke Patients Only)       Balance                                             Pertinent Vitals/Pain Pain Assessment: No/denies pain (Pt did have pain once standing on leg) Pain Score: 4  Pain Descriptors / Indicators: Aching Pain Intervention(s): Limited activity within patient's tolerance;Monitored during session;RN gave pain meds during session;Premedicated before session;Ice applied    Home Living Family/patient expects to be discharged to:: Skilled nursing facility Living Arrangements: Alone                    Prior Function Level of Independence: Independent  Comments: Pt is very active at home. Able to complete all housekeeping, meal prep, bathing, and dressing.      Hand Dominance   Dominant Hand: Left    Extremity/Trunk Assessment   Upper Extremity Assessment Upper Extremity Assessment: Defer to OT evaluation    Lower Extremity Assessment Lower Extremity Assessment: Generalized weakness       Communication   Communication: No difficulties  Cognition  Arousal/Alertness: Awake/alert Behavior During Therapy: WFL for tasks assessed/performed Overall Cognitive Status: Within Functional Limits for tasks assessed                                        General Comments      Exercises General Exercises - Lower Extremity Ankle Circles/Pumps: AROM;Both;20 reps;Supine Quad Sets: AROM;Right;10 reps;Supine Short Arc Quad: 10 reps;Supine;Right;AAROM Heel Slides: AAROM;Right;10 reps;Supine Hip ABduction/ADduction: AAROM;Right;10 reps;Supine Other Exercises Other Exercises: Supine single leg press- Rt: 1x10ARROM    Assessment/Plan    PT Assessment Patient needs continued PT services  PT Problem List Decreased strength;Decreased range of motion;Decreased activity tolerance;Decreased balance;Decreased mobility       PT Treatment Interventions DME instruction;Gait training;Functional mobility training;Therapeutic activities;Therapeutic exercise;Balance training;Patient/family education    PT Goals (Current goals can be found in the Care Plan section)  Acute Rehab PT Goals Patient Stated Goal: improve strength and mobilty. Decrease pain.  PT Goal Formulation: With patient Time For Goal Achievement: 12/31/16 Potential to Achieve Goals: Good    Frequency BID   Barriers to discharge Inaccessible home environment;Decreased caregiver support      Co-evaluation               AM-PAC PT "6 Clicks" Daily Activity  Outcome Measure Difficulty turning over in bed (including adjusting bedclothes, sheets and blankets)?: Total Difficulty moving from lying on back to sitting on the side of the bed? : Total Difficulty sitting down on and standing up from a chair with arms (e.g., wheelchair, bedside commode, etc,.)?: Total Help needed moving to and from a bed to chair (including a wheelchair)?: Total Help needed walking in hospital room?: Total Help needed climbing 3-5 steps with a railing? : Total 6 Click Score: 6    End of  Session Equipment Utilized During Treatment: Gait belt Activity Tolerance: Patient limited by fatigue;Patient limited by pain Patient left: in bed;with call bell/phone within reach;with bed alarm set;with nursing/sitter in room;with family/visitor present Nurse Communication: Mobility status;Patient requests pain meds PT Visit Diagnosis: Muscle weakness (generalized) (M62.81);Difficulty in walking, not elsewhere classified (R26.2)    Time: 4098-11911011-1046 PT Time Calculation (min) (ACUTE ONLY): 35 min   Charges:   PT Evaluation $PT Eval Low Complexity: 1 Procedure PT Treatments $Therapeutic Exercise: 8-22 mins $Therapeutic Activity: 8-22 mins   PT G Codes:        12:06 PM, 12/17/16 Rosamaria LintsAllan C Sutter Ahlgren, PT, DPT Physical Therapist - [place] [phone](ASCOM)  [phone] (Office)       Sakeenah Valcarcel C 12/17/2016, 12:02 PM

## 2016-12-18 ENCOUNTER — Inpatient Hospital Stay
Admission: RE | Admit: 2016-12-18 | Discharge: 2017-01-05 | Disposition: A | Discharge status: Home / Self Care | Payer: Medicare Other | Source: Ambulatory Visit | Attending: Pulmonary Disease | Admitting: Pulmonary Disease

## 2016-12-18 ENCOUNTER — Encounter (HOSPITAL_COMMUNITY): Payer: Self-pay | Admitting: Orthopedic Surgery

## 2016-12-18 LAB — BASIC METABOLIC PANEL
ANION GAP: 7 (ref 5–15)
BUN: 12 mg/dL (ref 6–20)
CALCIUM: 7.9 mg/dL — AB (ref 8.9–10.3)
CO2: 24 mmol/L (ref 22–32)
CREATININE: 0.92 mg/dL (ref 0.44–1.00)
Chloride: 103 mmol/L (ref 101–111)
GFR, EST NON AFRICAN AMERICAN: 58 mL/min — AB (ref >60)
Glucose, Bld: 122 mg/dL — ABNORMAL HIGH (ref 65–99)
Potassium: 3.1 mmol/L — ABNORMAL LOW (ref 3.5–5.1)
SODIUM: 134 mmol/L — AB (ref 135–145)

## 2016-12-18 LAB — CBC
HEMATOCRIT: 26.3 % — AB (ref 36.0–46.0)
Hemoglobin: 9.2 g/dL — ABNORMAL LOW (ref 12.0–15.0)
MCH: 31.7 pg (ref 26.0–34.0)
MCHC: 35 g/dL (ref 30.0–36.0)
MCV: 90.7 fL (ref 78.0–100.0)
PLATELETS: 127 10*3/uL — AB (ref 150–400)
RBC: 2.9 MIL/uL — ABNORMAL LOW (ref 3.87–5.11)
RDW: 13 % (ref 11.5–15.5)
WBC: 6.8 10*3/uL (ref 4.0–10.5)

## 2016-12-18 LAB — MAGNESIUM: MAGNESIUM: 1.6 mg/dL — AB (ref 1.7–2.4)

## 2016-12-18 MED ORDER — ONDANSETRON HCL 4 MG PO TABS: 4.0000 mg | ORAL_TABLET | Freq: Four times a day (QID) | ORAL | 0 refills | Status: DC | PRN

## 2016-12-18 MED ORDER — MAGNESIUM OXIDE 400 (241.3 MG) MG PO TABS: 400.0000 mg | ORAL_TABLET | Freq: Two times a day (BID) | ORAL | Status: DC

## 2016-12-18 MED ORDER — POTASSIUM CHLORIDE CRYS ER 20 MEQ PO TBCR
40.0000 meq | EXTENDED_RELEASE_TABLET | Freq: Two times a day (BID) | ORAL | Status: DC
  Administered 2016-12-18: 40 meq via ORAL
  Filled 2016-12-18 (×2): qty 2

## 2016-12-18 MED ORDER — POLYETHYLENE GLYCOL 3350 17 G PO PACK: 17.0000 g | PACK | Freq: Every day | ORAL | 0 refills | Status: DC | PRN

## 2016-12-18 MED ORDER — HYDROCODONE-ACETAMINOPHEN 5-325 MG PO TABS: 1.0000 | ORAL_TABLET | Freq: Four times a day (QID) | ORAL | 0 refills | Status: DC | PRN

## 2016-12-18 MED ORDER — METHOCARBAMOL 500 MG PO TABS
500.0000 mg | ORAL_TABLET | Freq: Four times a day (QID) | ORAL | Status: DC | PRN
Start: 2016-12-18 — End: 2017-01-28

## 2016-12-18 MED ORDER — MENTHOL 3 MG MT LOZG: 1.0000 | LOZENGE | OROMUCOSAL | 12 refills | Status: DC | PRN

## 2016-12-18 MED ORDER — ASPIRIN 325 MG PO TBEC: 325.0000 mg | DELAYED_RELEASE_TABLET | Freq: Every day | ORAL | 0 refills | Status: DC

## 2016-12-18 MED ORDER — MAGNESIUM OXIDE 400 (241.3 MG) MG PO TABS
400.0000 mg | ORAL_TABLET | Freq: Two times a day (BID) | ORAL | Status: DC
  Administered 2016-12-18: 400 mg via ORAL
  Filled 2016-12-18: qty 1

## 2016-12-18 NOTE — Progress Notes (Signed)
Patient ID: [name], female   DOB: 1935-08-24, 81 y.o.   MRN: [id number] BP (!) 112/53 (BP Location: Right Arm)   Pulse 72   Temp 98.6 F (37 C) (Oral)   Resp 18   Ht 5\' 4"  (1.626 m)   Wt 150 lb (68 kg)   SpO2 94%   BMI 25.75 kg/m   BMP Latest Ref Rng & Units 12/18/2016 12/17/2016 12/16/2016  Glucose 65 - 99 mg/dL 409(W122(H) 119(J127(H) -  BUN 6 - 20 mg/dL 12 11 -  Creatinine 4.780.44 - 1.00 mg/dL 2.950.92 6.21(H1.08(H) -  Sodium 135 - 145 mmol/L 134(L) 138 -  Potassium 3.5 - 5.1 mmol/L 3.1(L) 3.6 3.7  Chloride 101 - 111 mmol/L 103 108 -  CO2 22 - 32 mmol/L 24 24 -  Calcium 8.9 - 10.3 mg/dL 7.9(L) 8.2(L) -   CBC Latest Ref Rng & Units 12/18/2016 12/17/2016 12/15/2016  WBC 4.0 - 10.5 K/uL 6.8 7.0 5.2  Hemoglobin 12.0 - 15.0 g/dL 0.8(M9.2(L) 5.7(Q9.2(L) 11.1(L)  Hematocrit 36.0 - 46.0 % 26.3(L) 26.6(L) 31.7(L)  Platelets 150 - 400 K/uL 127(L) 117(L) 140(L)    Her wound is clean   Hip fracture instructions for ORIF  Procedure open treatment internal fixation with  Short gamma nail  Weightbearing status as tolerated  Staples removed postop day #  12  Followup visit. Postop day  #12-17 X-rays please have x-rays performed. Postop day 14 (AND BEFORE 1ST VISIT TO DR Romeo AppleHARRISON ) DVT prophylaxis x 28 days with aspirin

## 2016-12-18 NOTE — Discharge Summary (Signed)
Physician Discharge Summary  Patient ID: Julia Choi MRN: [id number] DOB/AGE: Nov 19, 1935 81 y.o. Primary Care Physician:Zamani Crocker, Ramon DredgeEdward, MD Admit date: 12/15/2016 Discharge date: 12/18/2016    Discharge Diagnoses:   Principal Problem:   Hip fracture [facility](HCC) Active Problems:   Closed nondisplaced intertrochanteric fracture of right femur (HCC)   HTN (hypertension)   HLD (hyperlipidemia) Hypokalemia Hypomagnesemia Thrombocytopenia Allergies as of 12/18/2016      Reactions   Prednisone Palpitations      Medication List    TAKE these medications   aspirin 325 MG EC tablet Take 1 tablet (325 mg total) by mouth daily with breakfast. Start taking on:  12/19/2016   atorvastatin 10 MG tablet Commonly known as:  LIPITOR Take 10 mg by mouth every evening.   BYSTOLIC 20 MG Tabs Generic drug:  Nebivolol HCl Take 20 mg by mouth daily.   cetirizine 10 MG tablet Commonly known as:  ZYRTEC Take 10 mg by mouth daily as needed for allergies.   HYDROcodone-acetaminophen 5-325 MG tablet Commonly known as:  NORCO/VICODIN Take 1-2 tablets by mouth every 6 (six) hours as needed for moderate pain.   losartan-hydrochlorothiazide 100-25 MG tablet Commonly known as:  HYZAAR Take 1 tablet by mouth daily.   magnesium oxide 400 (241.3 Mg) MG tablet Commonly known as:  MAG-OX Take 1 tablet (400 mg total) by mouth 2 (two) times daily.   menthol-cetylpyridinium 3 MG lozenge Commonly known as:  CEPACOL Take 1 lozenge (3 mg total) by mouth as needed for sore throat (sore throat).   methocarbamol 500 MG tablet Commonly known as:  ROBAXIN Take 1 tablet (500 mg total) by mouth every 6 (six) hours as needed for muscle spasms.   ondansetron 4 MG tablet Commonly known as:  ZOFRAN Take 1 tablet (4 mg total) by mouth every 6 (six) hours as needed for nausea.   polyethylene glycol packet Commonly known as:  MIRALAX / GLYCOLAX Take 17 g by mouth daily as needed for mild constipation.   ranitidine  150 MG tablet Commonly known as:  ZANTAC Take 150 mg by mouth 2 (two) times daily.       Discharged Condition:Improved    Consults: Orthopedics, Dr. Romeo AppleHarrison  Significant Diagnostic Studies: Dg Chest 1 View  Result Date: 12/15/2016 CLINICAL DATA:  Post fall, now with right hip pain. Preoperative examination. EXAM: CHEST 1 VIEW COMPARISON:  None. FINDINGS: Normal cardiac silhouette and mediastinal contours. No focal parenchymal opacities. No supine evidence of pleural effusion or pneumothorax. No evidence of edema. No acute osseus abnormalities. IMPRESSION: No acute cardiopulmonary disease. Electronically Signed   By: Simonne ComeJohn  Watts M.D.   On: 12/15/2016 14:10   Dg Pelvis Portable  Result Date: 12/16/2016 CLINICAL DATA:  ORIF right femur fracture. EXAM: PORTABLE PELVIS 1-2 VIEWS COMPARISON:  12/15/2016 radiographs FINDINGS: Internal nail and screw noted traversing a right intertrochanteric fracture, in near-anatomic alignment and position. No definite complicating features noted. IMPRESSION: ORIF right intertrochanteric femur fracture, in near-anatomic alignment and position. Electronically Signed   By: Harmon PierJeffrey  Hu M.D.   On: 12/16/2016 14:50   Dg Hip Operative Unilat With Pelvis Right  Result Date: 12/16/2016 CLINICAL DATA:  ORIF intertrochanteric fracture. EXAM: OPERATIVE right HIP (WITH PELVIS IF PERFORMED) 7 VIEWS TECHNIQUE: Fluoroscopic spot image(s) were submitted for interpretation post-operatively. COMPARISON:  12/15/2016 FINDINGS: Multiple C-arm images show gamma nail fixation of an intertrochanteric fracture with a distal locking screw. Components appear well positioned and alignment appears anatomic. IMPRESSION: Gamma nail ORIF of intertrochanteric fracture. Electronically Signed  By: Paulina Fusi M.D.   On: 12/16/2016 14:17   Dg Hip Unilat  With Pelvis 2-3 Views Right  Result Date: 12/15/2016 CLINICAL DATA:  Recent fall with right hip pain, initial encounter EXAM: DG HIP (WITH OR  WITHOUT PELVIS) 2-3V RIGHT COMPARISON:  None. FINDINGS: Comminuted intratrochanteric fracture is noted with mild displacement. No significant impaction and angulation is seen. Pelvic ring is intact. No soft tissue abnormality is noted. IMPRESSION: Comminuted intratrochanteric right femoral fracture Electronically Signed   By: Alcide Clever M.D.   On: 12/15/2016 14:11   Dg Femur, Min 2 Views Right  Result Date: 12/15/2016 CLINICAL DATA:  Recent fall with known proximal femoral fracture EXAM: RIGHT FEMUR 2 VIEWS COMPARISON:  None. FINDINGS: The comminuted intratrochanteric fracture is again identified. Degenerative changes are noted about the knee joint. No other femoral fracture is seen. No soft tissue abnormality is noted. IMPRESSION: Proximal right femoral fracture. The more distal femur is within normal limits with the exception of degenerative changes about the knee joint. Electronically Signed   By: Alcide Clever M.D.   On: 12/15/2016 14:11    Lab Results: Basic Metabolic Panel:  Recent Labs  16/10/96 0502 12/18/16 0516 12/18/16 0800  NA 138 134*  --   K 3.6 3.1*  --   CL 108 103  --   CO2 24 24  --   GLUCOSE 127* 122*  --   BUN 11 12  --   CREATININE 1.08* 0.92  --   CALCIUM 8.2* 7.9*  --   MG  --   --  1.6*   Liver Function Tests: No results for input(s): AST, ALT, ALKPHOS, BILITOT, PROT, ALBUMIN in the last 72 hours.   CBC:  Recent Labs  12/15/16 1304 12/17/16 0502 12/18/16 0516  WBC 5.2 7.0 6.8  NEUTROABS 3.4  --   --   HGB 11.1* 9.2* 9.2*  HCT 31.7* 26.6* 26.3*  MCV 91.1 91.7 90.7  PLT 140* 117* 127*    Recent Results (from the past 240 hour(s))  Surgical pcr screen     Status: None   Collection Time: 12/15/16 11:00 PM  Result Value Ref Range Status   MRSA, PCR NEGATIVE NEGATIVE Final   Staphylococcus aureus NEGATIVE NEGATIVE Final    Comment:        The Xpert SA Assay (FDA approved for NASAL specimens in patients over 79 years of age), is one component  of a comprehensive surveillance program.  Test performance has been validated by [facility] for patients greater than or equal to 30 year old. It is not intended to diagnose infection nor to guide or monitor treatment.      Hospital Course: This is a 81 year old who was working in her yard when she pulled the car off the floor of her garage and was startled by a large black snake. She fell and broke her hip. She called for help from neighbors and was brought to the emergency department and she was found to have a fractured right hip. She was admitted to the hospital and underwent surgery on the third. She started with physical therapy on the fourth. Her potassium has been low and her magnesium level was low on the fifth and these are going to be replaced. She is going to be transferred to skilled care facility for rehabilitation. She will need to have magnesium and potassium level checked on 12/21/2016 she may be able to come off the magnesium replacement at that point.  Discharge  Exam: Blood pressure (!) 112/53, pulse 72, temperature 98.6 F (37 C), temperature source Oral, resp. rate 18, height 5\' 4"  (1.626 m), weight 68 kg (150 lb), SpO2 94 %. She is awake and alert. She looks comfortable.  Disposition: To skilled care facility for rehabilitation      Signed: Semaj Kham L   12/18/2016, 8:52 AM

## 2016-12-18 NOTE — Progress Notes (Signed)
Subjective: She says she feels okay. She is anxious to start rehabilitation. She has no other new complaints.  Objective: Vital signs in last 24 hours: Temp:  [98.6 F (37 C)-98.8 F (37.1 C)] 98.6 F (37 C) (06/04 2100) Pulse Rate:  [67-78] 72 (06/04 2100) Resp:  [18] 18 (06/04 2100) BP: (112-114)/(51-53) 112/53 (06/04 2100) SpO2:  [92 %-95 %] 94 % (06/04 2100) Weight change:  Last BM Date: 12/14/16  Intake/Output from previous day: 06/04 0701 - 06/05 0700 In: -  Out: 550 [Urine:550]  PHYSICAL EXAM General appearance: alert, cooperative and no distress Resp: clear to auscultation bilaterally Cardio: regular rate and rhythm, S1, S2 normal, no murmur, click, rub or gallop GI: soft, non-tender; bowel sounds normal; no masses,  no organomegaly Extremities: extremities normal, atraumatic, no cyanosis or edema Skin warm and dry wound looks good  Lab Results:  Results for orders placed or performed during the hospital encounter of 12/15/16 (from the past 48 hour(s))  Potassium     Status: None   Collection Time: 12/16/16  3:31 PM  Result Value Ref Range   Potassium 3.7 3.5 - 5.1 mmol/L  CBC     Status: Abnormal   Collection Time: 12/17/16  5:02 AM  Result Value Ref Range   WBC 7.0 4.0 - 10.5 K/uL   RBC 2.90 (L) 3.87 - 5.11 MIL/uL   Hemoglobin 9.2 (L) 12.0 - 15.0 g/dL   HCT 26.6 (L) 36.0 - 46.0 %   MCV 91.7 78.0 - 100.0 fL   MCH 31.7 26.0 - 34.0 pg   MCHC 34.6 30.0 - 36.0 g/dL   RDW 13.0 11.5 - 15.5 %   Platelets 117 (L) 150 - 400 K/uL    Comment: SPECIMEN CHECKED FOR CLOTS PLATELET COUNT CONFIRMED BY SMEAR   Basic metabolic panel     Status: Abnormal   Collection Time: 12/17/16  5:02 AM  Result Value Ref Range   Sodium 138 135 - 145 mmol/L   Potassium 3.6 3.5 - 5.1 mmol/L   Chloride 108 101 - 111 mmol/L   CO2 24 22 - 32 mmol/L   Glucose, Bld 127 (H) 65 - 99 mg/dL   BUN 11 6 - 20 mg/dL   Creatinine, Ser 1.08 (H) 0.44 - 1.00 mg/dL   Calcium 8.2 (L) 8.9 - 10.3  mg/dL   GFR calc non Af Amer 48 (L) >60 mL/min   GFR calc Af Amer 55 (L) >60 mL/min    Comment: (NOTE) The eGFR has been calculated using the CKD EPI equation. This calculation has not been validated in all clinical situations. eGFR's persistently <60 mL/min signify possible Chronic Kidney Disease.    Anion gap 6 5 - 15  CBC     Status: Abnormal   Collection Time: 12/18/16  5:16 AM  Result Value Ref Range   WBC 6.8 4.0 - 10.5 K/uL   RBC 2.90 (L) 3.87 - 5.11 MIL/uL   Hemoglobin 9.2 (L) 12.0 - 15.0 g/dL   HCT 26.3 (L) 36.0 - 46.0 %   MCV 90.7 78.0 - 100.0 fL   MCH 31.7 26.0 - 34.0 pg   MCHC 35.0 30.0 - 36.0 g/dL   RDW 13.0 11.5 - 15.5 %   Platelets 127 (L) 150 - 400 K/uL  Basic metabolic panel     Status: Abnormal   Collection Time: 12/18/16  5:16 AM  Result Value Ref Range   Sodium 134 (L) 135 - 145 mmol/L   Potassium 3.1 (L) 3.5 -  5.1 mmol/L   Chloride 103 101 - 111 mmol/L   CO2 24 22 - 32 mmol/L   Glucose, Bld 122 (H) 65 - 99 mg/dL   BUN 12 6 - 20 mg/dL   Creatinine, Ser 0.92 0.44 - 1.00 mg/dL   Calcium 7.9 (L) 8.9 - 10.3 mg/dL   GFR calc non Af Amer 58 (L) >60 mL/min   GFR calc Af Amer >60 >60 mL/min    Comment: (NOTE) The eGFR has been calculated using the CKD EPI equation. This calculation has not been validated in all clinical situations. eGFR's persistently <60 mL/min signify possible Chronic Kidney Disease.    Anion gap 7 5 - 15  Magnesium     Status: Abnormal   Collection Time: 12/18/16  8:00 AM  Result Value Ref Range   Magnesium 1.6 (L) 1.7 - 2.4 mg/dL    ABGS No results for input(s): PHART, PO2ART, TCO2, HCO3 in the last 72 hours.  Invalid input(s): PCO2 CULTURES Recent Results (from the past 240 hour(s))  Surgical pcr screen     Status: None   Collection Time: 12/15/16 11:00 PM  Result Value Ref Range Status   MRSA, PCR NEGATIVE NEGATIVE Final   Staphylococcus aureus NEGATIVE NEGATIVE Final    Comment:        The Xpert SA Assay (FDA approved  for NASAL specimens in patients over 45 years of age), is one component of a comprehensive surveillance program.  Test performance has been validated by [facility] for patients greater than or equal to 75 year old. It is not intended to diagnose infection nor to guide or monitor treatment.    Studies/Results: Dg Pelvis Portable  Result Date: 12/16/2016 CLINICAL DATA:  ORIF right femur fracture. EXAM: PORTABLE PELVIS 1-2 VIEWS COMPARISON:  12/15/2016 radiographs FINDINGS: Internal nail and screw noted traversing a right intertrochanteric fracture, in near-anatomic alignment and position. No definite complicating features noted. IMPRESSION: ORIF right intertrochanteric femur fracture, in near-anatomic alignment and position. Electronically Signed   By: Margarette Canada M.D.   On: 12/16/2016 14:50   Dg Hip Operative Unilat With Pelvis Right  Result Date: 12/16/2016 CLINICAL DATA:  ORIF intertrochanteric fracture. EXAM: OPERATIVE right HIP (WITH PELVIS IF PERFORMED) 7 VIEWS TECHNIQUE: Fluoroscopic spot image(s) were submitted for interpretation post-operatively. COMPARISON:  12/15/2016 FINDINGS: Multiple C-arm images show gamma nail fixation of an intertrochanteric fracture with a distal locking screw. Components appear well positioned and alignment appears anatomic. IMPRESSION: Gamma nail ORIF of intertrochanteric fracture. Electronically Signed   By: Nelson Chimes M.D.   On: 12/16/2016 14:17    Medications:  Prior to Admission:  Prescriptions Prior to Admission  Medication Sig Dispense Refill Last Dose  . atorvastatin (LIPITOR) 10 MG tablet Take 10 mg by mouth every evening.    12/14/2016 at Unknown time  . cetirizine (ZYRTEC) 10 MG tablet Take 10 mg by mouth daily as needed for allergies.   Past Week at Unknown time  . losartan-hydrochlorothiazide (HYZAAR) 100-25 MG tablet Take 1 tablet by mouth daily.   12/15/2016 at Unknown time  . Nebivolol HCl (BYSTOLIC) 20 MG TABS Take 20 mg by mouth daily.    12/15/2016 at Unknown time  . ranitidine (ZANTAC) 150 MG tablet Take 150 mg by mouth 2 (two) times daily.   12/15/2016 at Unknown time   Scheduled: . aspirin EC  325 mg Oral Q breakfast  . atorvastatin  10 mg Oral q1800  . famotidine  20 mg Oral BID  . losartan  100 mg Oral Daily   And  . hydrochlorothiazide  25 mg Oral Daily  . loratadine  10 mg Oral Daily  . nebivolol  10 mg Oral Daily  . nebivolol  20 mg Oral Daily  . potassium chloride  40 mEq Oral BID  . traMADol  50 mg Oral Q6H   Continuous: . methocarbamol (ROBAXIN)  IV     ALC:[id number], menthol-cetylpyridinium **OR** phenol, methocarbamol **OR** methocarbamol (ROBAXIN)  IV, metoCLOPramide **OR** metoCLOPramide (REGLAN) injection, morphine injection, ondansetron **OR** ondansetron (ZOFRAN) IV, polyethylene glycol  Assesment: She had a hip fracture and has had surgery for that. She has some postop anemia but not at the point that she's going to need transfusion. Her potassium is low and will need to be replaced. Magnesium is low and will need to be replaced Principal Problem:   Hip fracture (HCC) Active Problems:   Closed nondisplaced intertrochanteric fracture of right femur (HCC)   HTN (hypertension)   HLD (hyperlipidemia)    Plan: Replace magnesium and potassium.    LOS: 3 days   Brendin Situ L 12/18/2016, 8:41 AM

## 2016-12-18 NOTE — Clinical Social Work Note (Signed)
LCSW left a message for [facility] requesting return contact to assess if a bed offer was given and to inform of patient's discharge today.     Jawuan Robb, Juleen ChinaHeather D, LCSW

## 2016-12-18 NOTE — Progress Notes (Signed)
Physical Therapy Treatment Patient Details Name: Julia Choi MRN: [id number] DOB: 1935/08/26 Today's Date: 12/18/2016    History of Present Illness Julia Choi is a 81yo white female who while cleaning garage at home, sustained a fall and fracture to Rt hip. She came to [facility] on 6/2 adn underwent ORIF on 6/3, is now POD1 and WBAT on RLE. Pt was previously a fully independent community dwelling adult, living alone.     PT Comments    Pt with noted improvement in bed level exercises, able to advance reps to 15x and many now able to perform with CGA to minA whereas yesterday pt required up to mod/max physical assist. Dizziness persists during sitting and standing, and with worsening nausea during gait, patient noted to be orthostatic with drop from 115/49 mmHg seated to 71/56 mmHg after 15ft AMB. AMB limited due to orthostatics and nausea, with questionable benefit due to severe antalgic gait, with maladaptive compensations which require constance VC and limited capacity to correct at this time. Pt will likely demonstrate improved gait tolerance once BP is stable and pain is better managed. Pt making progress toward goals overall.     Follow Up Recommendations  SNF;Supervision for mobility/OOB     Equipment Recommendations       Recommendations for Other Services       Precautions / Restrictions Precautions Precautions: Fall Precaution Comments: Due to recent surgery and fall at home. Restrictions RLE Weight Bearing: Weight bearing as tolerated    Mobility  Bed Mobility Overal bed mobility: Needs Assistance Bed Mobility: Supine to Sit     Supine to sit: Max assist     General bed mobility comments: taught ankle hook with LLE, but pt with significant truncal weakness and still requires heavy assistance.   Transfers Overall transfer level: Needs assistance Equipment used: Rolling walker (2 wheeled) Transfers: Sit to/from Stand (2x) Sit to Stand: From elevated surface;Min  guard         General transfer comment: modA from BSC (standard height surface)   Ambulation/Gait   Ambulation Distance (Feet): 20 Feet Assistive device: Rolling walker (2 wheeled) Gait Pattern/deviations: Step-to pattern;Antalgic   Gait velocity interpretation: <1.8 ft/sec, indicative of risk for recurrent falls General Gait Details: dizziness remains, pain worsening over time, and nausea worsening with noted orthostatic vitals after 10 feet.    Stairs            Wheelchair Mobility    Modified Rankin (Stroke Patients Only)       Balance Overall balance assessment: Needs assistance;No apparent balance deficits (not formally assessed)         Standing balance support: During functional activity   Standing balance comment: mild impulsivivty to cease activity                            Cognition Arousal/Alertness: Awake/alert Behavior During Therapy: WFL for tasks assessed/performed Overall Cognitive Status: Within Functional Limits for tasks assessed                                        Exercises General Exercises - Lower Extremity Ankle Circles/Pumps: AROM;Both;20 reps;Supine Quad Sets: AROM;Right;Supine;15 reps Gluteal Sets: Right;AROM;Supine;15 reps Short Arc Quad: Supine;Right;15 reps;AROM Heel Slides: AAROM;Right;Supine;15 reps Hip ABduction/ADduction: Right;Supine;AAROM;15 reps Straight Leg Raises: Right;10 reps;Supine;AAROM (bent knee raise ) Low Level/ICU Exercises Stabilized Bridging: AROM;Both;10 reps;Supine Other Exercises  Other Exercises: pillow squeeze: 1x10     General Comments        Pertinent Vitals/Pain Pain Assessment: 0-10 Pain Score: 5  Pain Intervention(s): Limited activity within patient's tolerance;Monitored during session;Premedicated before session    Home Living                      Prior Function            PT Goals (current goals can now be found in the care plan section)  Acute Rehab PT Goals Patient Stated Goal: improve strength and mobilty. Decrease pain.  PT Goal Formulation: With patient Time For Goal Achievement: 12/31/16 Potential to Achieve Goals: Good Progress towards PT goals: Progressing toward goals    Frequency    BID      PT Plan Current plan remains appropriate    Co-evaluation              AM-PAC PT "6 Clicks" Daily Activity  Outcome Measure  Difficulty turning over in bed (including adjusting bedclothes, sheets and blankets)?: Total Difficulty moving from lying on back to sitting on the side of the bed? : Total Difficulty sitting down on and standing up from a chair with arms (e.g., wheelchair, bedside commode, etc,.)?: A Lot Help needed moving to and from a bed to chair (including a wheelchair)?: Total Help needed walking in hospital room?: A Lot Help needed climbing 3-5 steps with a railing? : Total 6 Click Score: 8    End of Session Equipment Utilized During Treatment: Gait belt Activity Tolerance: Patient tolerated treatment well;Patient limited by fatigue;Treatment limited secondary to medical complications (Comment) (nausea orthostatic BP during gait) Patient left: in chair;with call bell/phone within reach;with chair alarm set Nurse Communication: Mobility status (viitals) PT Visit Diagnosis: Muscle weakness (generalized) (M62.81);Difficulty in walking, not elsewhere classified (R26.2)     Time: [id number] PT Time Calculation (min) (ACUTE ONLY): 40 min  Charges:  $Therapeutic Exercise: 23-37 mins $Therapeutic Activity: 8-22 mins                    G Codes:       10:21 AM, 12/18/16 Rosamaria LintsAllan C Oaklen Thiam, PT, DPT Physical Therapist - [place] [phone](ASCOM)  [phone] (Office)    Natalin Bible C 12/18/2016, 10:18 AM

## 2016-12-18 NOTE — Clinical Social Work Placement (Addendum)
   CLINICAL SOCIAL WORK PLACEMENT  NOTE  Date:  12/18/2016  Patient Details  Name: Julia HartiganBetty D Kitko MRN: [id number] Date of Birth: [birth year]  Clinical Social Work is seeking post-discharge placement for this patient at the Skilled  Nursing Facility level of care (*CSW will initial, date and re-position this form in  chart as items are completed):  Yes   Patient/family provided with [facility] Clinical Social Work Department's list of facilities offering this level of care within the geographic area requested by the patient (or if unable, by the patient's family).  Yes   Patient/family informed of their freedom to choose among providers that offer the needed level of care, that participate in Medicare, Medicaid or managed care program needed by the patient, have an available bed and are willing to accept the patient.  Yes   Patient/family informed of [facility]'s ownership interest in [facility] and [facility], as well as of the fact that they are under no obligation to receive care at these facilities.  PASRR submitted to EDS on 12/17/16     PASRR number received on 12/17/16   [id number]   Existing PASRR number confirmed on       FL2 transmitted to all facilities in geographic area requested by pt/family on 12/17/16     FL2 transmitted to all facilities within larger geographic area on 12/18/16     Patient informed that his/her managed care company has contracts with or will negotiate with certain facilities, including the following:        Yes   Patient/family informed of bed offers received.  [facility]     Physician recommends and patient chooses bed at      [facility] to [facility] on 12/18/16.  [facility]     Patient family notified on 12/18/16 of transfer.  Name of family member notified:  Shelbie AmmonsBob Doss, brother     PHYSICIAN       Additional Comment:  L[facility]. LCSW sent clinicals via Epic Hub.   _______________________________________________ Annice NeedySettle, Heather D, LCSW 12/18/2016, 1:34 PM

## 2016-12-19 LAB — TYPE AND SCREEN
ABO/RH(D): O POS
ANTIBODY SCREEN: NEGATIVE
Unit division: 0
Unit division: 0

## 2016-12-19 LAB — BPAM RBC
BLOOD PRODUCT EXPIRATION DATE: 201806252359
BLOOD PRODUCT EXPIRATION DATE: 201806252359
UNIT TYPE AND RH: 5100
UNIT TYPE AND RH: 5100

## 2016-12-21 ENCOUNTER — Encounter (HOSPITAL_COMMUNITY)
Admission: RE | Admit: 2016-12-21 | Discharge: 2016-12-21 | Disposition: A | Discharge status: Home / Self Care | Payer: Medicare Other | Source: Skilled Nursing Facility | Attending: Pulmonary Disease | Admitting: Pulmonary Disease

## 2016-12-21 LAB — BASIC METABOLIC PANEL
Anion gap: 8 (ref 5–15)
BUN: 23 mg/dL — AB (ref 6–20)
CHLORIDE: 97 mmol/L — AB (ref 101–111)
CO2: 30 mmol/L (ref 22–32)
CREATININE: 0.96 mg/dL (ref 0.44–1.00)
Calcium: 8.7 mg/dL — ABNORMAL LOW (ref 8.9–10.3)
GFR calc Af Amer: >60 mL/min (ref >60)
GFR calc non Af Amer: 55 mL/min — ABNORMAL LOW (ref >60)
GLUCOSE: 132 mg/dL — AB (ref 65–99)
POTASSIUM: 3.8 mmol/L (ref 3.5–5.1)
SODIUM: 135 mmol/L (ref 135–145)

## 2016-12-21 LAB — MAGNESIUM: MAGNESIUM: 2 mg/dL (ref 1.7–2.4)

## 2016-12-22 NOTE — H&P (Signed)
Julia HartiganBetty D Choi MRN: [id number] DOB/AGE: [birth year] 81 y.o. Primary Care Physician:Julia Choi, Julia DredgeEdward, MD Admit date: 12/18/2016 Chief Complaint: For rehabilitation HPI: This is a 81 year old who is a resident at the skilled care facility for rehabilitation after having had surgery for a fractured hip. She says she still has pain in her hip and she's not able to walk yet. She complains that she doesn't think she's getting the appropriate therapy and she may want to transfer to a skilled care facility in [place] where her husband was treated. She has no other complaints. She is breathing well. No chest pain. No abdominal pain nausea vomiting diarrhea shortness of breath. Her wound has been examined and looks good.  Past Medical History:  Diagnosis Date  . Hypertension   . Reflux    cholesterol, hyn   Past Surgical History:  Procedure Laterality Date  . INTRAMEDULLARY (IM) NAIL INTERTROCHANTERIC Right 12/16/2016   Procedure: INTRAMEDULLARY (IM) NAIL INTERTROCHANTRIC;  Surgeon: Julia HearingHarrison, Julia E, MD;  Location: [facility];  Service: Orthopedics;  Laterality: Right;        No family history on file. She says her family generally had good health and lived to advanced old age. She is not aware of any particular problems in the family Social History:  reports that she has never smoked. She has never used smokeless tobacco. She reports that she does not drink alcohol or use drugs.   Allergies:  Allergies  Allergen Reactions  . Prednisone Palpitations    Medications Prior to Admission  Medication Sig Dispense Refill  . aspirin EC 325 MG EC tablet Take 1 tablet (325 mg total) by mouth daily with breakfast. 30 tablet 0  . atorvastatin (LIPITOR) 10 MG tablet Take 10 mg by mouth every evening.     . cetirizine (ZYRTEC) 10 MG tablet Take 10 mg by mouth daily as needed for allergies.    Marland Kitchen. HYDROcodone-acetaminophen (NORCO/VICODIN) 5-325 MG tablet Take 1-2 tablets by mouth every 6 (six) hours as  needed for moderate pain. 30 tablet 0  . losartan-hydrochlorothiazide (HYZAAR) 100-25 MG tablet Take 1 tablet by mouth daily.    . magnesium oxide (MAG-OX) 400 (241.3 Mg) MG tablet Take 1 tablet (400 mg total) by mouth 2 (two) times daily.    Marland Kitchen. menthol-cetylpyridinium (CEPACOL) 3 MG lozenge Take 1 lozenge (3 mg total) by mouth as needed for sore throat (sore throat). 100 tablet 12  . methocarbamol (ROBAXIN) 500 MG tablet Take 1 tablet (500 mg total) by mouth every 6 (six) hours as needed for muscle spasms.    . Nebivolol HCl (BYSTOLIC) 20 MG TABS Take 20 mg by mouth daily.    . ondansetron (ZOFRAN) 4 MG tablet Take 1 tablet (4 mg total) by mouth every 6 (six) hours as needed for nausea. 20 tablet 0  . polyethylene glycol (MIRALAX / GLYCOLAX) packet Take 17 g by mouth daily as needed for mild constipation. 14 each 0  . ranitidine (ZANTAC) 150 MG tablet Take 150 mg by mouth 2 (two) times daily.         NWG:NFAOZROS:apart from the symptoms mentioned above,there are no other symptoms referable to all systems reviewed.10 point review of systems otherwise negative  Physical Exam: There were no vitals taken for this visit. Constitutional: She is awake and alert and in no acute distress. Ears nose mouth and throat: Her mucous membranes are moist. Her Choi is grossly normal. Eyes: EOMI. Cardiovascular: Her heart is regular with normal heart sounds. Respiratory: Her respiratory effort  is normal. Her lungs are clear. Gastrointestinal: Her abdomen is soft. Skin: Warm and dry. Musculoskeletal: She is in a wheelchair upper extremity strength is normal. Neurological: No focal abnormalities psychiatric: Normal mood and affect   No results for input(s): WBC, NEUTROABS, HGB, HCT, MCV, PLT in the last 72 hours.  Recent Labs  12/21/16 0615  NA 135  K 3.8  CL 97*  CO2 30  GLUCOSE 132*  BUN 23*  CREATININE 0.96  CALCIUM 8.7*  MG 2.0  lablast2(ast:2,ALT:2,alkphos:2,bilitot:2,prot:2,albumin:2)@    Recent  Results (from the past 240 hour(s))  Surgical pcr screen     Status: None   Collection Time: 12/15/16 11:00 PM  Result Value Ref Range Status   MRSA, PCR NEGATIVE NEGATIVE Final   Staphylococcus aureus NEGATIVE NEGATIVE Final    Comment:        The Xpert SA Assay (FDA approved for NASAL specimens in patients over 32 years of age), is one component of a comprehensive surveillance program.  Test performance has been validated by [facility] for patients greater than or equal to 78 year old. It is not intended to diagnose infection nor to guide or monitor treatment.      Dg Chest 1 View  Result Date: 12/15/2016 CLINICAL DATA:  Post fall, now with right hip pain. Preoperative examination. EXAM: CHEST 1 VIEW COMPARISON:  None. FINDINGS: Normal cardiac silhouette and mediastinal contours. No focal parenchymal opacities. No supine evidence of pleural effusion or pneumothorax. No evidence of edema. No acute osseus abnormalities. IMPRESSION: No acute cardiopulmonary disease. Electronically Signed   By: Julia Choi M.D.   On: 12/15/2016 14:10   Dg Pelvis Portable  Result Date: 12/16/2016 CLINICAL DATA:  ORIF right femur fracture. EXAM: PORTABLE PELVIS 1-2 VIEWS COMPARISON:  12/15/2016 radiographs FINDINGS: Internal nail and screw noted traversing a right intertrochanteric fracture, in near-anatomic alignment and position. No definite complicating features noted. IMPRESSION: ORIF right intertrochanteric femur fracture, in near-anatomic alignment and position. Electronically Signed   By: Julia Choi M.D.   On: 12/16/2016 14:50   Dg Hip Operative Unilat With Pelvis Right  Result Date: 12/16/2016 CLINICAL DATA:  ORIF intertrochanteric fracture. EXAM: OPERATIVE right HIP (WITH PELVIS IF PERFORMED) 7 VIEWS TECHNIQUE: Fluoroscopic spot image(s) were submitted for interpretation post-operatively. COMPARISON:  12/15/2016 FINDINGS: Multiple C-arm images show gamma nail fixation of an intertrochanteric  fracture with a distal locking screw. Components appear well positioned and alignment appears anatomic. IMPRESSION: Gamma nail ORIF of intertrochanteric fracture. Electronically Signed   By: Paulina Fusi M.D.   On: 12/16/2016 14:17   Dg Hip Unilat  With Pelvis 2-3 Views Right  Result Date: 12/15/2016 CLINICAL DATA:  Recent fall with right hip pain, initial encounter EXAM: DG HIP (WITH OR WITHOUT PELVIS) 2-3V RIGHT COMPARISON:  None. FINDINGS: Comminuted intratrochanteric fracture is noted with mild displacement. No significant impaction and angulation is seen. Pelvic ring is intact. No soft tissue abnormality is noted. IMPRESSION: Comminuted intratrochanteric right femoral fracture Electronically Signed   By: Alcide Clever M.D.   On: 12/15/2016 14:11   Dg Femur, Min 2 Views Right  Result Date: 12/15/2016 CLINICAL DATA:  Recent fall with known proximal femoral fracture EXAM: RIGHT FEMUR 2 VIEWS COMPARISON:  None. FINDINGS: The comminuted intratrochanteric fracture is again identified. Degenerative changes are noted about the knee joint. No other femoral fracture is seen. No soft tissue abnormality is noted. IMPRESSION: Proximal right femoral fracture. The more distal femur is within normal limits with the exception of degenerative changes  about the knee joint. Electronically Signed   By: Alcide Clever M.D.   On: 12/15/2016 14:11   Impression: She had a hip fracture. She has hypertension which is well controlled. She has GERD and that is stable Active Problems:   * No active hospital problems. *     Plan: Continue rehabilitation      Amadeus Oyama L   12/22/2016, 11:08 AM

## 2016-12-28 ENCOUNTER — Other Ambulatory Visit: Payer: Self-pay | Admitting: *Deleted

## 2016-12-28 DIAGNOSIS — S72144D Nondisplaced intertrochanteric fracture of right femur, subsequent encounter for closed fracture with routine healing: Secondary | ICD-10-CM

## 2016-12-31 ENCOUNTER — Ambulatory Visit (INDEPENDENT_AMBULATORY_CARE_PROVIDER_SITE_OTHER): Payer: Self-pay | Admitting: Orthopedic Surgery

## 2016-12-31 ENCOUNTER — Encounter: Payer: Self-pay | Admitting: Orthopedic Surgery

## 2016-12-31 ENCOUNTER — Ambulatory Visit (HOSPITAL_COMMUNITY)
Admission: RE | Admit: 2016-12-31 | Discharge: 2016-12-31 | Disposition: A | Discharge status: Home / Self Care | Payer: Medicare Other | Source: Ambulatory Visit | Attending: Orthopedic Surgery | Admitting: Orthopedic Surgery

## 2016-12-31 DIAGNOSIS — X58XXXA Exposure to other specified factors, initial encounter: Secondary | ICD-10-CM | POA: Insufficient documentation

## 2016-12-31 DIAGNOSIS — S72141D Displaced intertrochanteric fracture of right femur, subsequent encounter for closed fracture with routine healing: Secondary | ICD-10-CM

## 2016-12-31 DIAGNOSIS — Z4889 Encounter for other specified surgical aftercare: Secondary | ICD-10-CM

## 2016-12-31 DIAGNOSIS — S72144D Nondisplaced intertrochanteric fracture of right femur, subsequent encounter for closed fracture with routine healing: Secondary | ICD-10-CM | POA: Insufficient documentation

## 2016-12-31 DIAGNOSIS — Z9889 Other specified postprocedural states: Secondary | ICD-10-CM | POA: Insufficient documentation

## 2016-12-31 NOTE — Progress Notes (Signed)
Chief Complaint  Patient presents with  . Follow-up    Recheck on right hip fracture, DOS 12-16-16.    Encounter Diagnoses  Name Primary?  Marland Kitchen. Aftercare following surgery Yes  . Closed fracture of femur, intertrochanteric, right, with routine healing, subsequent encounter     Postop visit this is postop day #15 status post open treatment internal fixation right hip with gamma nail the patient is doing well pain is controlled she is ambulatory with a walker she should be able to go home on Friday. She will follow-up here for x-ray in the office in 4 weeks

## 2017-01-28 ENCOUNTER — Ambulatory Visit (INDEPENDENT_AMBULATORY_CARE_PROVIDER_SITE_OTHER): Payer: Self-pay | Admitting: Orthopedic Surgery

## 2017-01-28 ENCOUNTER — Encounter: Payer: Self-pay | Admitting: Orthopedic Surgery

## 2017-01-28 ENCOUNTER — Ambulatory Visit (INDEPENDENT_AMBULATORY_CARE_PROVIDER_SITE_OTHER): Payer: Medicare Other

## 2017-01-28 DIAGNOSIS — S72141D Displaced intertrochanteric fracture of right femur, subsequent encounter for closed fracture with routine healing: Secondary | ICD-10-CM

## 2017-01-28 NOTE — Progress Notes (Signed)
Postop visit  Chief Complaint  Patient presents with  . Follow-up    ORIF RT HIP, DOS 12/16/16    History this is a 81 year old female this is postop week #6  She is doing well, she is using a cane. She's been released from therapy. Her x-ray looks great. Her incisions are clean dry and intact. Her greater trochanter as sore as expected  Recommend x-ray in 6 weeks  Encounter Diagnosis  Name Primary?  . Closed fracture of femur, intertrochanteric, right, with routine healing, subsequent encounter Yes

## 2017-03-11 ENCOUNTER — Ambulatory Visit (INDEPENDENT_AMBULATORY_CARE_PROVIDER_SITE_OTHER): Payer: Medicare Other

## 2017-03-11 ENCOUNTER — Ambulatory Visit (INDEPENDENT_AMBULATORY_CARE_PROVIDER_SITE_OTHER): Payer: Self-pay | Admitting: Orthopedic Surgery

## 2017-03-11 DIAGNOSIS — S72141D Displaced intertrochanteric fracture of right femur, subsequent encounter for closed fracture with routine healing: Secondary | ICD-10-CM | POA: Diagnosis not present

## 2017-03-11 NOTE — Progress Notes (Signed)
POST OP; Follow-up     ORIF RT HIP, DOS 12/16/16;  Pod # [id number]  X-rays today show fracture healing with no complications from the hardware in the short gamma nail  Her leg lengths are equal. Rotational alignment is normal. Hip flexion is excellent.  Follow up in 2 months  Encounter Diagnosis  Name Primary?  . Closed fracture of femur, intertrochanteric, right, with routine healing, subsequent encounter Yes

## 2017-03-13 ENCOUNTER — Other Ambulatory Visit (HOSPITAL_COMMUNITY): Payer: Self-pay | Admitting: Pulmonary Disease

## 2017-03-13 DIAGNOSIS — Z780 Asymptomatic menopausal state: Secondary | ICD-10-CM

## 2017-03-21 ENCOUNTER — Other Ambulatory Visit (HOSPITAL_COMMUNITY): Payer: Medicare Other

## 2017-03-28 ENCOUNTER — Ambulatory Visit (HOSPITAL_COMMUNITY)
Admission: RE | Admit: 2017-03-28 | Discharge: 2017-03-28 | Disposition: A | Discharge status: Home / Self Care | Payer: Medicare Other | Source: Ambulatory Visit | Attending: Pulmonary Disease | Admitting: Pulmonary Disease

## 2017-03-28 DIAGNOSIS — N951 Menopausal and female climacteric states: Secondary | ICD-10-CM | POA: Insufficient documentation

## 2017-03-28 DIAGNOSIS — M85852 Other specified disorders of bone density and structure, left thigh: Secondary | ICD-10-CM | POA: Diagnosis not present

## 2017-03-28 DIAGNOSIS — Z780 Asymptomatic menopausal state: Secondary | ICD-10-CM

## 2017-03-28 LAB — HM DEXA SCAN

## 2017-05-10 ENCOUNTER — Ambulatory Visit (INDEPENDENT_AMBULATORY_CARE_PROVIDER_SITE_OTHER): Payer: Medicare Other | Admitting: Orthopedic Surgery

## 2017-05-10 DIAGNOSIS — S72144D Nondisplaced intertrochanteric fracture of right femur, subsequent encounter for closed fracture with routine healing: Secondary | ICD-10-CM | POA: Diagnosis not present

## 2017-05-10 NOTE — Progress Notes (Signed)
  Chief complaint follow-up right hip open treatment internal fixation with gamma nail  The patient had surgery on June 3.  She's doing very well she is walking independently she's having no complaints of pain she's regained full range of motion  REVIEW OF SYSTEMS SHE'S NOT HAVING ANY BACK PAIN OR NUMBNESS IN HER LEG  Physical Exam  Constitutional: She is oriented to person, place, and time. She appears well-developed and well-nourished.  Neurological: She is alert and oriented to person, place, and time.  Skin: Skin is warm and dry. Capillary refill takes less than 2 seconds.  Psychiatric: She has a normal mood and affect. Her behavior is normal. Judgment and thought content normal.   Today in the office both hips flex normally and equally without pain. Her leg lengths are equal. She is ambulatory no no limping is noted.  Encounter Diagnosis  Name Primary?  . Closed nondisplaced intertrochanteric fracture of right femur with routine healing, subsequent encounter Yes   Plan follow-up as needed

## 2018-07-26 IMAGING — DX DG CHEST 1V
1 series · 1 of 1 positions shown · non-contrast
Comparison: None.

CLINICAL DATA: Post fall, now with right hip pain. Preoperative
examination.

EXAM:
CHEST 1 VIEW

[chest ap]
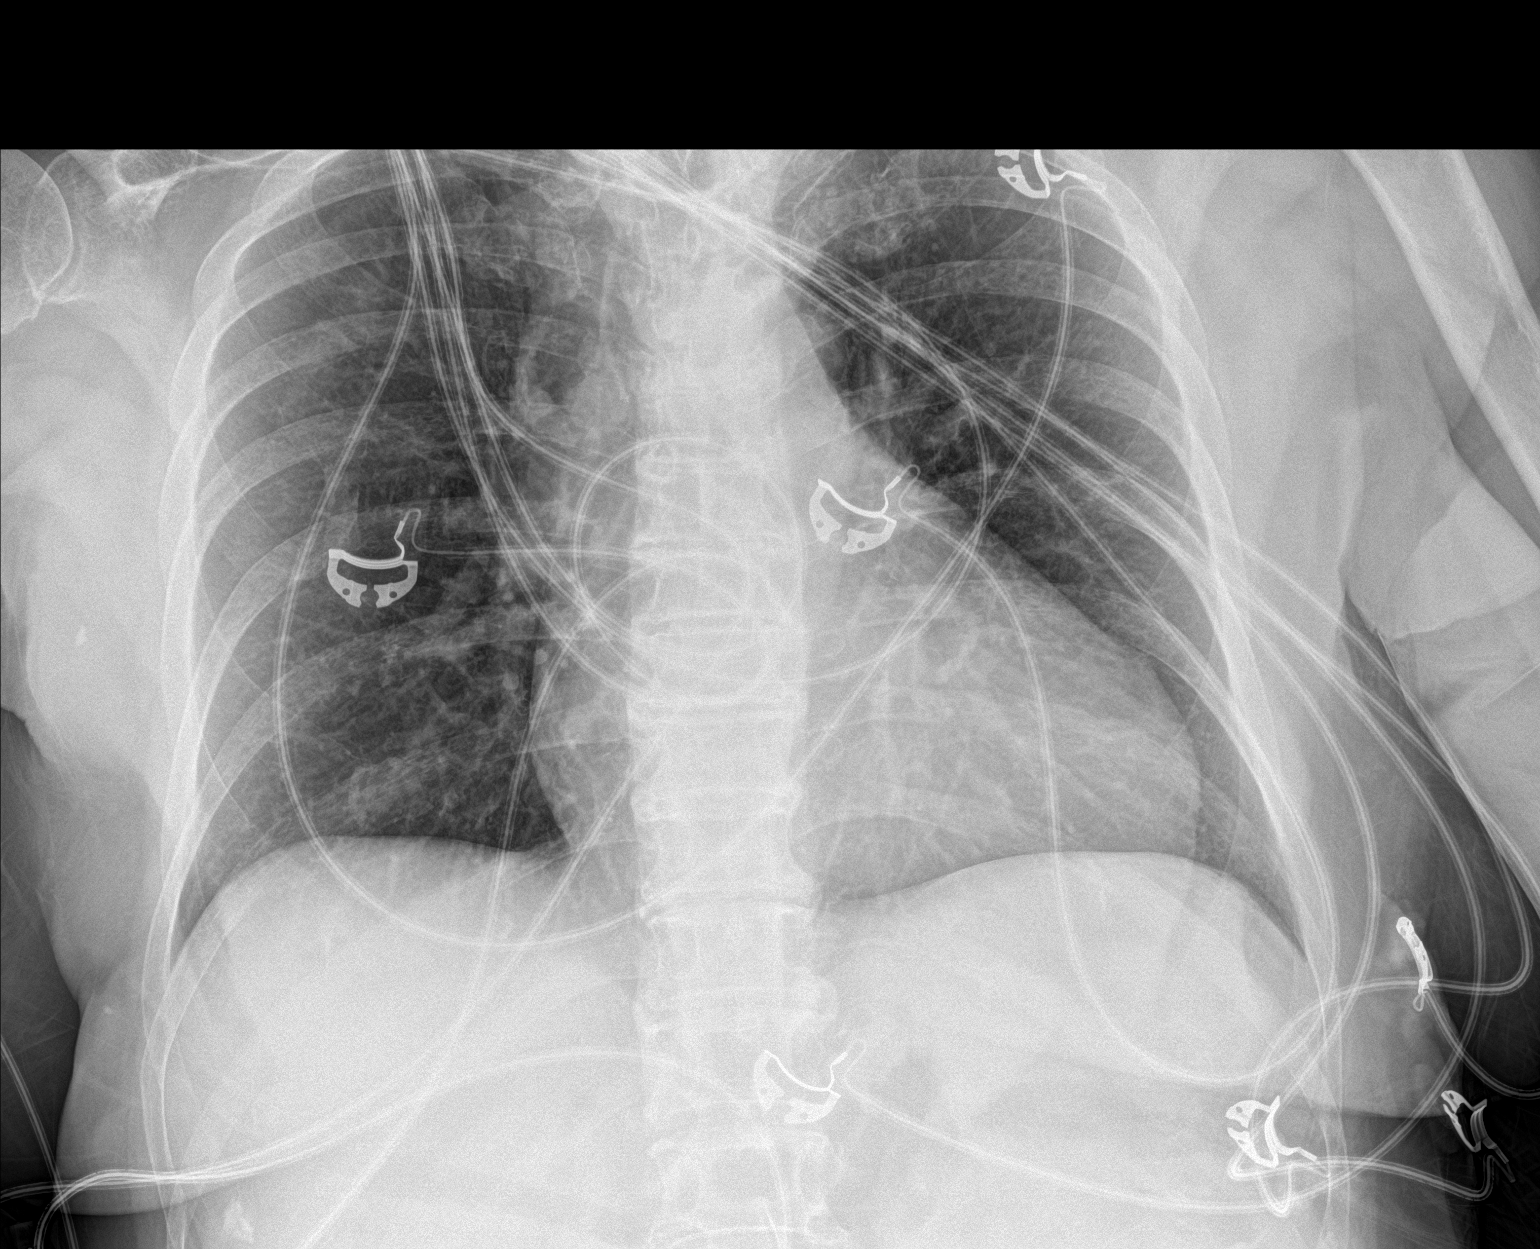

[1 of 1 positions shown; findings below may reference images not displayed]

FINDINGS: Normal cardiac silhouette and mediastinal contours. No focal
parenchymal opacities. No supine evidence of pleural effusion or
pneumothorax. No evidence of edema. No acute osseus abnormalities.
IMPRESSION: No acute cardiopulmonary disease.

## 2018-07-26 IMAGING — DX DG FEMUR 2+V*R*
2 series · 2 of 2 positions shown · non-contrast
Comparison: None.

CLINICAL DATA: Recent fall with known proximal femoral fracture

EXAM:
RIGHT FEMUR 2 VIEWS

[femur lat (1 of 2)]
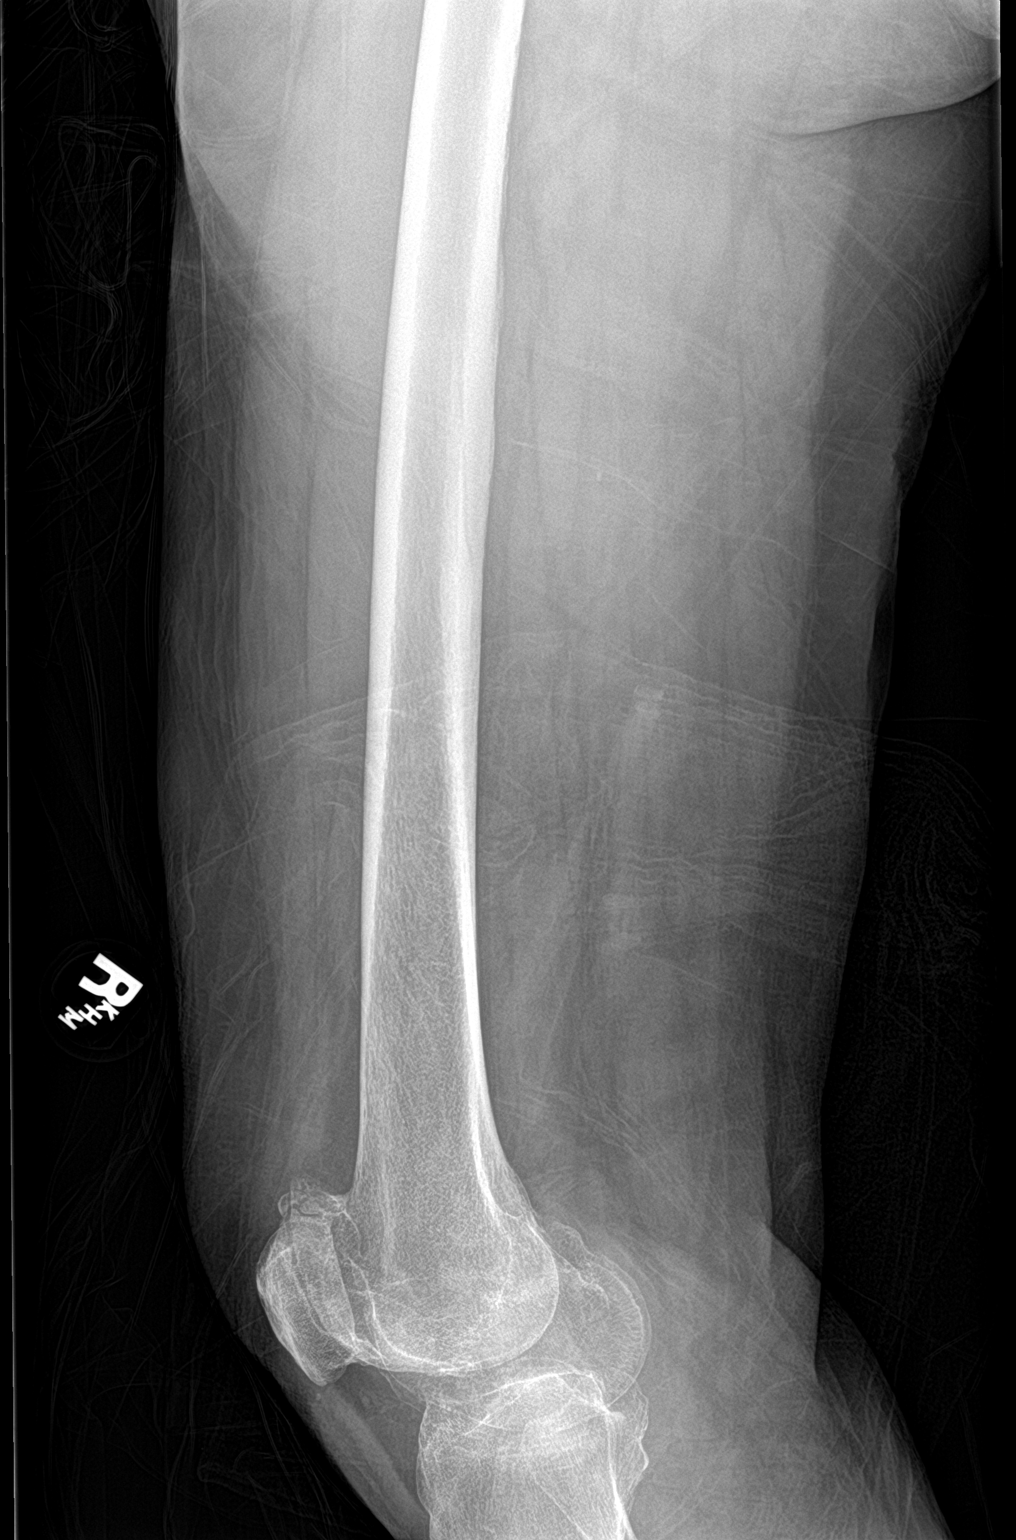

[femur lat (2 of 2)]
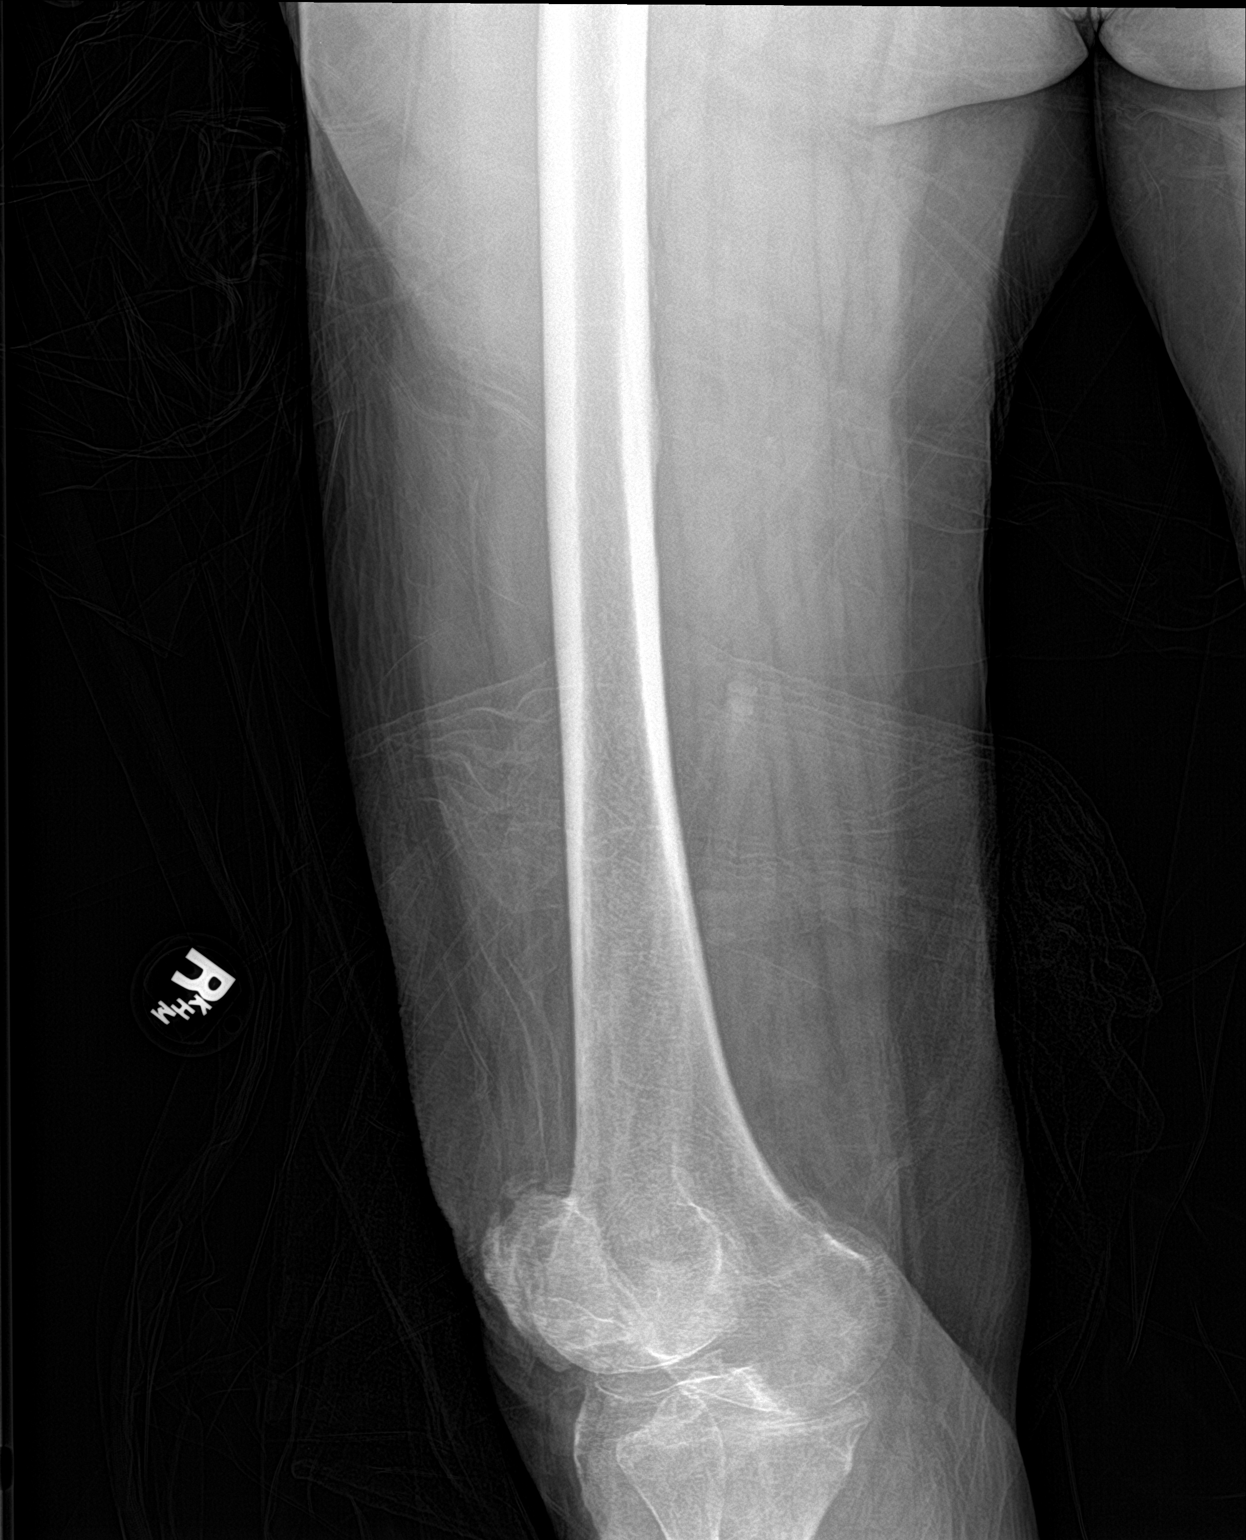

[2 of 2 positions shown; findings below may reference images not displayed]

FINDINGS: The comminuted intratrochanteric fracture is again identified.
Degenerative changes are noted about the knee joint. No other
femoral fracture is seen. No soft tissue abnormality is noted.
IMPRESSION: Proximal right femoral fracture. The more distal femur is within
normal limits with the exception of degenerative changes about the
knee joint.

## 2018-07-26 IMAGING — DX DG HIP (WITH OR WITHOUT PELVIS) 2-3V*R*
3 series · 3 of 3 positions shown · non-contrast
Comparison: None.

CLINICAL DATA: Recent fall with right hip pain, initial encounter

EXAM:
DG HIP (WITH OR WITHOUT PELVIS) 2-3V RIGHT

[pelvis ap]
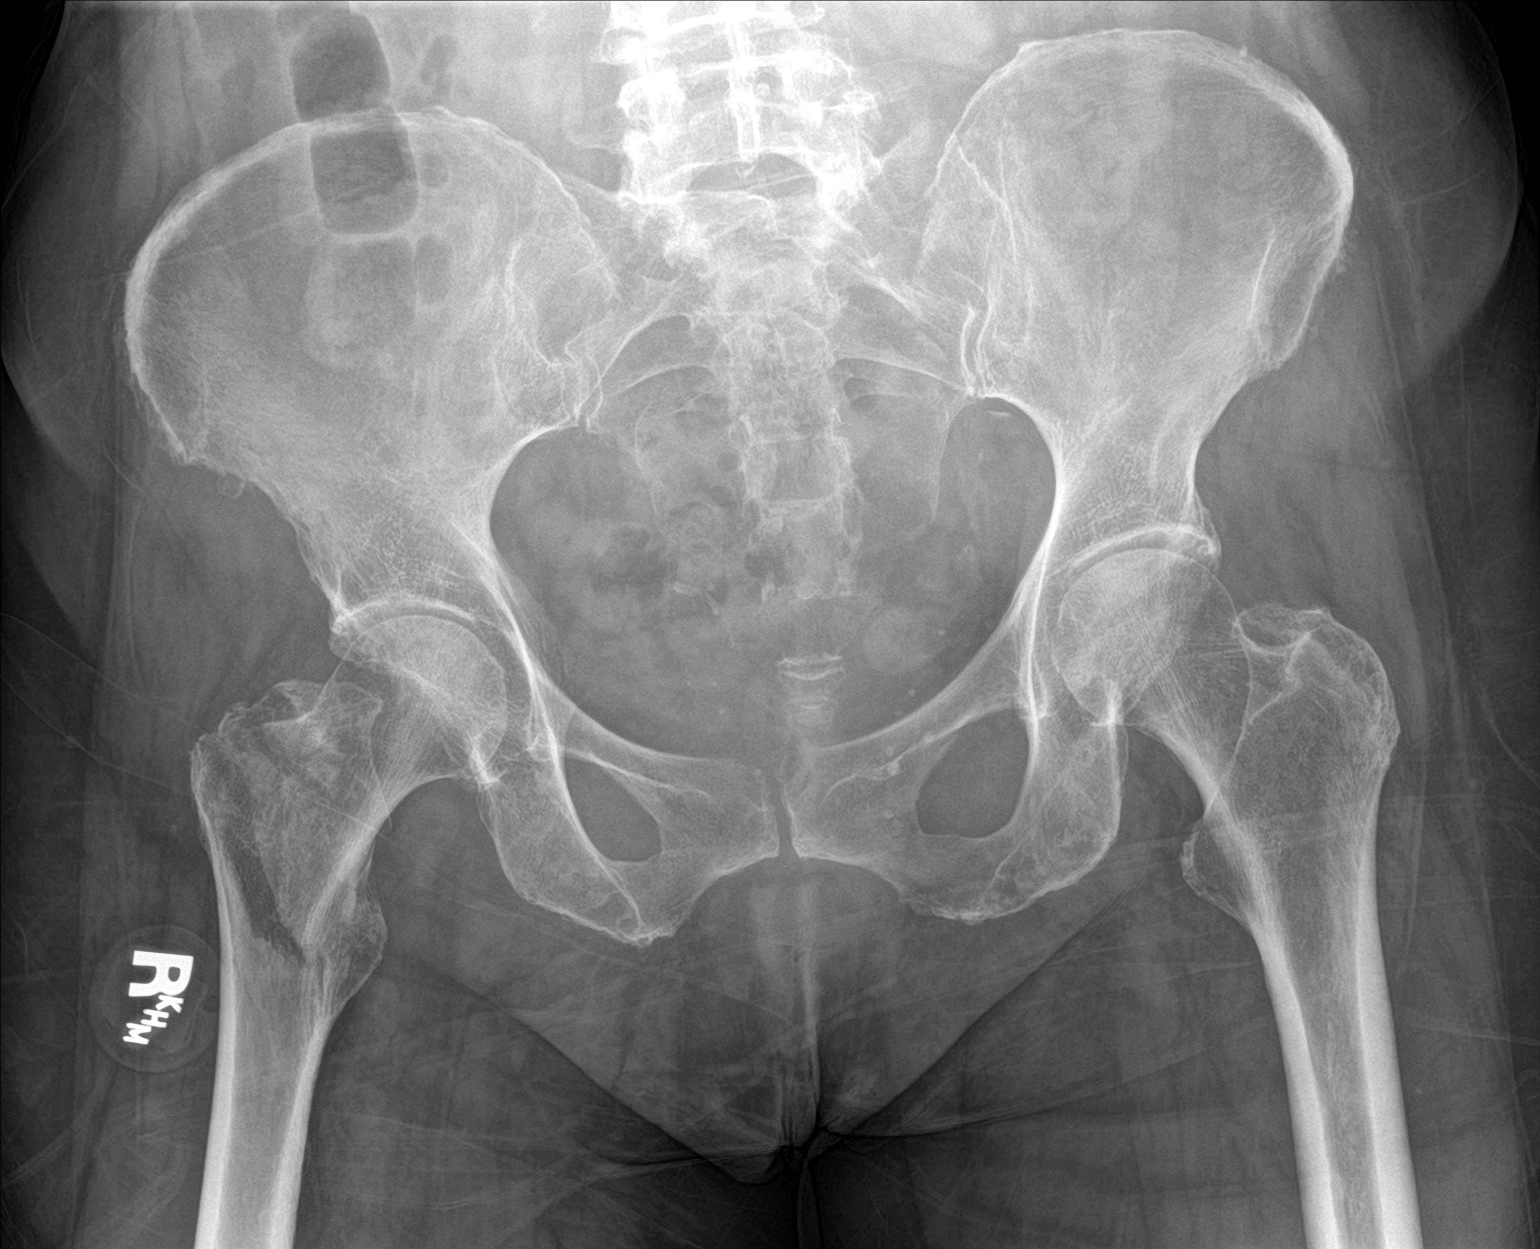

[hip ap]
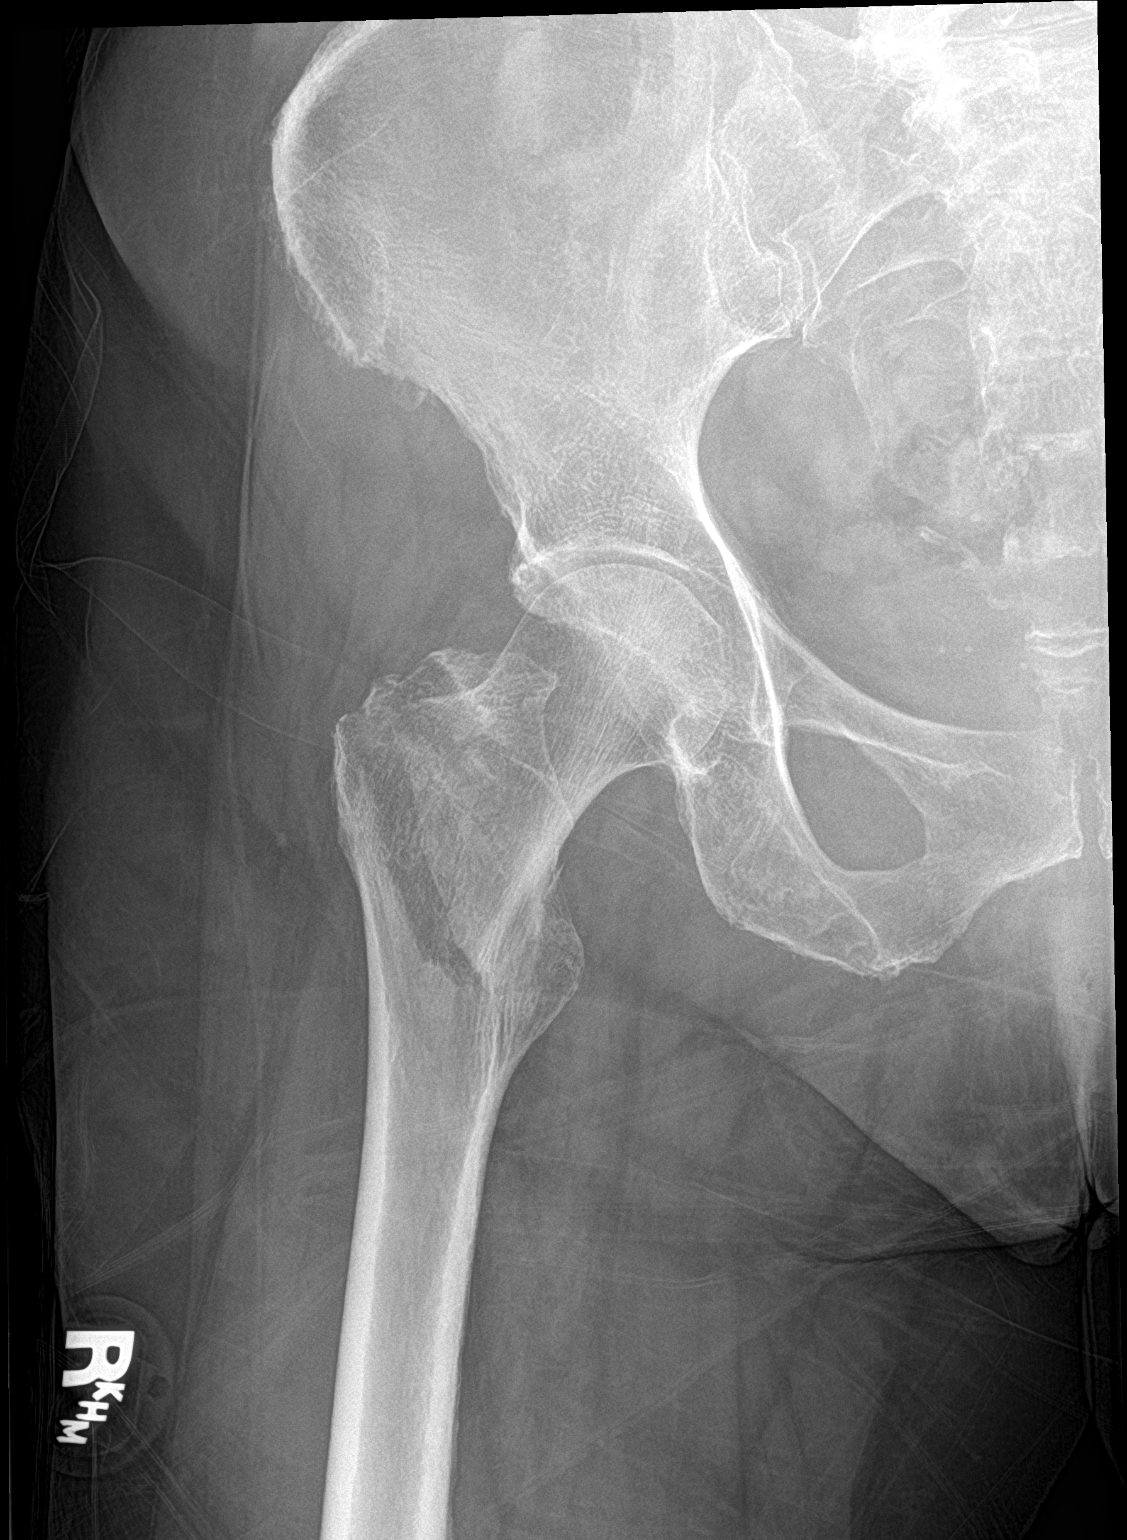

[hip lat]
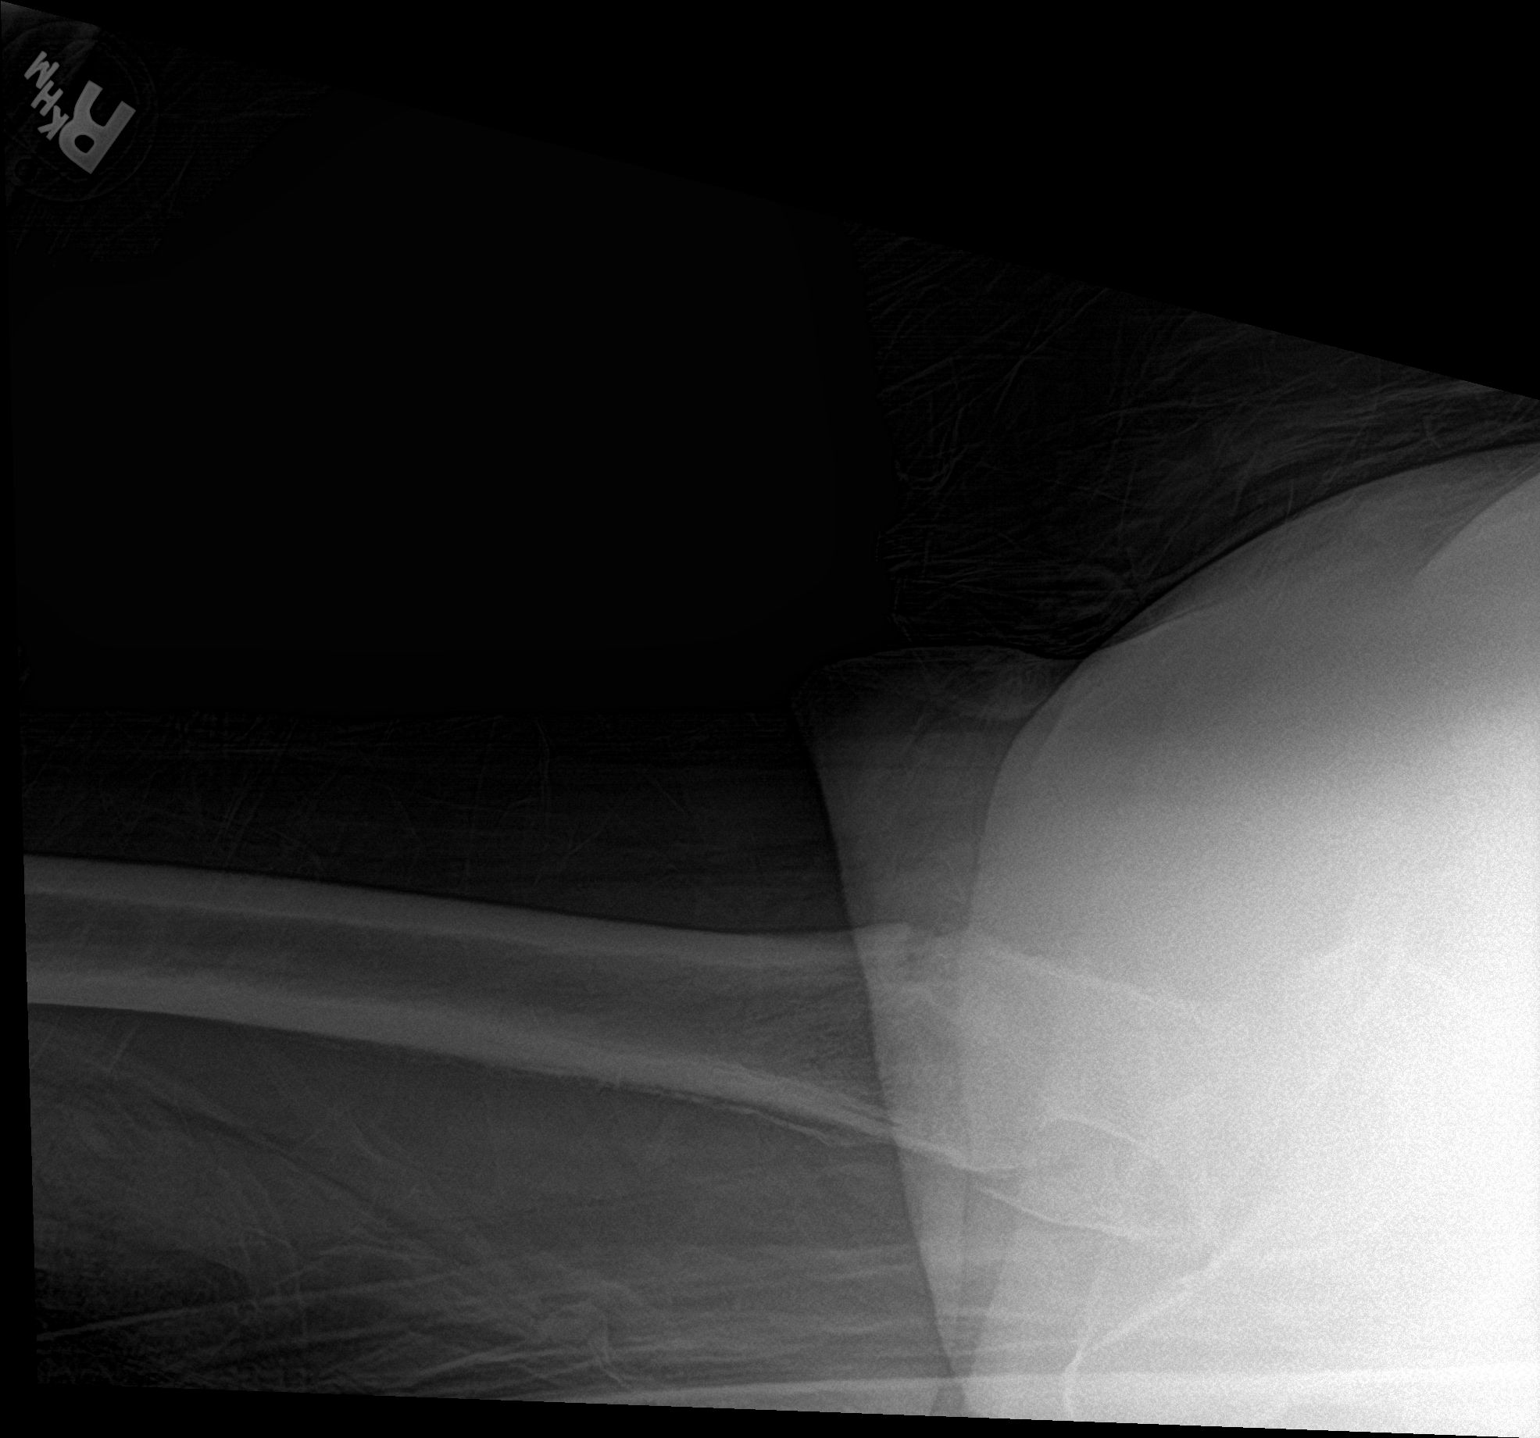

[3 of 3 positions shown; findings below may reference images not displayed]

FINDINGS: Comminuted intratrochanteric fracture is noted with mild
displacement. No significant impaction and angulation is seen.
Pelvic ring is intact. No soft tissue abnormality is noted.
IMPRESSION: Comminuted intratrochanteric right femoral fracture

## 2019-06-04 LAB — HEMOGLOBIN A1C: Hemoglobin A1C: 6

## 2019-06-04 LAB — COMPREHENSIVE METABOLIC PANEL
Calcium: 9.9 (ref 8.7–10.7)
GFR calc Af Amer: 63
GFR calc non Af Amer: 54

## 2019-06-04 LAB — TSH: TSH: 1.53 (ref <=5.90)

## 2019-06-04 LAB — CBC AND DIFFERENTIAL
HCT: 35 — AB (ref 36–46)
Hemoglobin: 12 (ref 12.0–16.0)
Platelets: 208 (ref 150–399)

## 2019-06-04 LAB — BASIC METABOLIC PANEL
CO2: 24 — AB (ref 13–22)
Chloride: 94 — AB (ref 99–108)
Potassium: 4.3 (ref 3.4–5.3)

## 2019-06-04 LAB — CBC: RBC: 3.74 — AB (ref 3.87–5.11)

## 2019-06-05 LAB — LIPID PANEL
Cholesterol: 158 (ref 0–200)
HDL: 60 (ref 35–70)
LDL Cholesterol: 78
Triglycerides: 110 (ref 40–160)

## 2019-06-09 ENCOUNTER — Other Ambulatory Visit (HOSPITAL_COMMUNITY): Payer: Self-pay | Admitting: Pulmonary Disease

## 2019-06-09 DIAGNOSIS — M8580 Other specified disorders of bone density and structure, unspecified site: Secondary | ICD-10-CM

## 2019-06-15 ENCOUNTER — Other Ambulatory Visit (HOSPITAL_COMMUNITY): Payer: Medicare Other

## 2019-08-25 ENCOUNTER — Ambulatory Visit: Payer: Medicare Other | Admitting: Family Medicine

## 2019-08-25 ENCOUNTER — Encounter: Payer: Self-pay | Admitting: Family Medicine

## 2019-08-25 ENCOUNTER — Other Ambulatory Visit: Payer: Self-pay

## 2019-08-25 VITALS — BP 140/80 | HR 62 | Temp 98.1°F | Ht 64.0 in | Wt 162.6 lb

## 2019-08-25 DIAGNOSIS — E785 Hyperlipidemia, unspecified: Secondary | ICD-10-CM | POA: Diagnosis not present

## 2019-08-25 DIAGNOSIS — I10 Essential (primary) hypertension: Secondary | ICD-10-CM

## 2019-08-25 DIAGNOSIS — Z780 Asymptomatic menopausal state: Secondary | ICD-10-CM | POA: Diagnosis not present

## 2019-08-25 NOTE — Progress Notes (Signed)
New Patient Office Visit  Subjective:  Patient ID: Julia Choi, female    DOB: 1936/07/03  Age: 84 y.o. MRN: [id number]  CC:  Chief Complaint  Patient presents with  . Establish Care  HTN-Losartan/HCTZ/Bystolic Hyperlipidemia-atorvastatin  HPI LEONARD HENDLER presents for Losartan/HCTZ/Bystolic-renal function normal-05/2019 Hyperlipidemia-LDL 78 Elevated glucose -A1c 6.0% GERD-Tums occasionally Past Medical History:  Diagnosis Date  . Hypertension   . Reflux    cholesterol, hyn    Past Surgical History:  Procedure Laterality Date  . INTRAMEDULLARY (IM) NAIL INTERTROCHANTERIC Right 12/16/2016   Procedure: INTRAMEDULLARY (IM) NAIL INTERTROCHANTRIC;  Surgeon: Vickki Hearing, MD;  Location: [facility];  Service: Orthopedics;  Laterality: Right;  . JOINT REPLACEMENT    2018 hip-right-full function  Social History   Socioeconomic History  . Marital status: Widowed    Spouse name: Not on file  . Number of children: Not on file  . Years of education: Not on file  . Highest education level: Not on file  Occupational History  . Occupation: retired  Tobacco Use  . Smoking status: Never Smoker  . Smokeless tobacco: Never Used  Substance and Sexual Activity  . Alcohol use: No  . Drug use: No  . Sexual activity: Not Currently  Other Topics Concern  . Not on file  Social History Narrative  . Not on file   Social Determinants of Health   Financial Resource Strain:   . Difficulty of Paying Living Expenses: Not on file  Food Insecurity:   . Worried About Programme researcher, broadcasting/film/video in the Last Year: Not on file  . Ran Out of Food in the Last Year: Not on file  Transportation Needs:   . Lack of Transportation (Medical): Not on file  . Lack of Transportation (Non-Medical): Not on file  Physical Activity:   . Days of Exercise per Week: Not on file  . Minutes of Exercise per Session: Not on file  Stress:   . Feeling of Stress : Not on file  Social Connections:   . Frequency  of Communication with Friends and Family: Not on file  . Frequency of Social Gatherings with Friends and Family: Not on file  . Attends Religious Services: Not on file  . Active Member of Clubs or Organizations: Not on file  . Attends Banker Meetings: Not on file  . Marital Status: Not on file  Intimate Partner Violence:   . Fear of Current or Ex-Partner: Not on file  . Emotionally Abused: Not on file  . Physically Abused: Not on file  . Sexually Abused: Not on file    ROS Review of Systems  Constitutional: Negative.   HENT:       No hearing aids  Eyes:       Cataract surgery-readings glasses  Respiratory: Negative.   Cardiovascular: Negative.   Gastrointestinal:       Occasional GERD  Endocrine: Negative.   Genitourinary: Negative.   Musculoskeletal:       Hip replacement  Skin: Negative.   Allergic/Immunologic: Negative.   Neurological: Negative.   Hematological: Negative.   Psychiatric/Behavioral: Negative.     Objective:   Today's Vitals: BP 140/80 (BP Location: Left Arm, Patient Position: Sitting, Cuff Size: Normal)   Pulse 62   Temp 98.1 F (36.7 C) (Oral)   Ht 5\' 4"  (1.626 m)   Wt 162 lb 9.6 oz (73.8 kg)   SpO2 99%   BMI 27.91 kg/m   Physical Exam Constitutional:  Appearance: Normal appearance.  HENT:     Head: Normocephalic and atraumatic.  Cardiovascular:     Rate and Rhythm: Normal rate and regular rhythm.     Pulses: Normal pulses.     Heart sounds: Normal heart sounds.  Pulmonary:     Effort: Pulmonary effort is normal.     Breath sounds: Normal breath sounds.  Musculoskeletal:     Cervical back: Normal range of motion and neck supple.  Neurological:     Mental Status: She is alert and oriented to person, place, and time.  Psychiatric:        Mood and Affect: Mood normal.        Behavior: Behavior normal.     Assessment & Plan:   1. Essential hypertension Losartan/HCTZ/Bystolic-stable, renal function normal  2.  Hyperlipidemia, unspecified hyperlipidemia type lipitor-lipid panel , lft normal 11/20  Outpatient Encounter Medications as of 08/25/2019  Medication Sig  . atorvastatin (LIPITOR) 10 MG tablet Take 10 mg by mouth every evening.   . cetirizine (ZYRTEC) 10 MG tablet Take 10 mg by mouth daily as needed for allergies.  Marland Kitchen losartan-hydrochlorothiazide (HYZAAR) 100-25 MG tablet Take 1 tablet by mouth daily.  . Nebivolol HCl (BYSTOLIC) 20 MG TABS Take 20 mg by mouth daily.  . [DISCONTINUED] aspirin EC 325 MG EC tablet Take 1 tablet (325 mg total) by mouth daily with breakfast. (Patient not taking: Reported on 08/25/2019)  . [DISCONTINUED] magnesium oxide (MAG-OX) 400 (241.3 Mg) MG tablet Take 1 tablet (400 mg total) by mouth 2 (two) times daily. (Patient not taking: Reported on 08/25/2019)  . [DISCONTINUED] menthol-cetylpyridinium (CEPACOL) 3 MG lozenge Take 1 lozenge (3 mg total) by mouth as needed for sore throat (sore throat). (Patient not taking: Reported on 08/25/2019)  . [DISCONTINUED] polyethylene glycol (MIRALAX / GLYCOLAX) packet Take 17 g by mouth daily as needed for mild constipation. (Patient not taking: Reported on 08/25/2019)  . [DISCONTINUED] ranitidine (ZANTAC) 150 MG tablet Take 150 mg by mouth 2 (two) times daily.   No facility-administered encounter medications on file as of 08/25/2019.    Follow-up: 6 month f/u-DEXA, consider mammogram, 11/2019-labwork-non fasting Qaadir Kent Hannah Beat, MD

## 2019-08-25 NOTE — Patient Instructions (Addendum)
COVID-19 Vaccine Information can be found at: [email or web address] For questions related to vaccine distribution or appointments, please email [email or web address] or call [phone].   Consider a mammogram Recommend DEXA scan

## 2020-02-24 ENCOUNTER — Ambulatory Visit: Payer: Medicare Other | Admitting: Family Medicine

## 2020-12-28 DIAGNOSIS — K219 Gastro-esophageal reflux disease without esophagitis: Secondary | ICD-10-CM | POA: Insufficient documentation

## 2021-07-12 ENCOUNTER — Ambulatory Visit: Payer: Self-pay | Admitting: Family Medicine

## 2021-07-21 ENCOUNTER — Ambulatory Visit: Payer: Medicare Other | Admitting: Family Medicine

## 2021-07-21 ENCOUNTER — Other Ambulatory Visit: Payer: Self-pay

## 2021-07-21 ENCOUNTER — Encounter: Payer: Self-pay | Admitting: Family Medicine

## 2021-07-21 VITALS — BP 128/82 | HR 72 | Temp 98.2°F | Ht 61.0 in | Wt 174.0 lb

## 2021-07-21 DIAGNOSIS — I10 Essential (primary) hypertension: Secondary | ICD-10-CM | POA: Diagnosis not present

## 2021-07-21 DIAGNOSIS — E785 Hyperlipidemia, unspecified: Secondary | ICD-10-CM

## 2021-07-21 DIAGNOSIS — Z23 Encounter for immunization: Secondary | ICD-10-CM | POA: Diagnosis not present

## 2021-07-21 DIAGNOSIS — R011 Cardiac murmur, unspecified: Secondary | ICD-10-CM | POA: Insufficient documentation

## 2021-07-21 DIAGNOSIS — Z862 Personal history of diseases of the blood and blood-forming organs and certain disorders involving the immune mechanism: Secondary | ICD-10-CM

## 2021-07-21 DIAGNOSIS — M8580 Other specified disorders of bone density and structure, unspecified site: Secondary | ICD-10-CM

## 2021-07-21 DIAGNOSIS — R7303 Prediabetes: Secondary | ICD-10-CM | POA: Diagnosis not present

## 2021-07-21 NOTE — Assessment & Plan Note (Signed)
Murmur noted on exam.  Planning for echocardiogram in the future.

## 2021-07-21 NOTE — Assessment & Plan Note (Signed)
Well-controlled.  Continue nebivolol, and losartan HCTZ.

## 2021-07-21 NOTE — Patient Instructions (Addendum)
Labs today.   If you need any refills please let me know.  Follow up in 6 months.  Take care  Dr. Adriana Simas

## 2021-07-21 NOTE — Assessment & Plan Note (Signed)
Has been well controlled.  Needs current labs.  Labs today.  Continue Lipitor.

## 2021-07-21 NOTE — Progress Notes (Signed)
Subjective:  Patient ID: Julia Choi, female    DOB: [birth year]  Age: 86 y.o. MRN: [id number]  CC: Chief Complaint  Patient presents with   Establish Care    HPI:  86 year old female with osteopenia, prediabetes, hypertension, hyperlipidemia presents to establish care with me.  She is a new patient.  Patient states that she is feeling well.  Hypertension BP well controlled especially given her age.  She is compliant with losartan/HCTZ as well as nebivolol.  Hyperlipidemia Last labs that I am able to see were in 2020.  LDL at that time was 78.  She is compliant with Lipitor.  No reported adverse side effects.  Patient Active Problem List   Diagnosis Date Noted   Osteopenia 07/21/2021   Prediabetes 16/57/9038   Systolic murmur 33/38/3291   Essential hypertension 12/15/2016   HLD (hyperlipidemia) 12/15/2016    Social Hx   Social History   Socioeconomic History   Marital status: Widowed    Spouse name: Not on file   Number of children: Not on file   Years of education: Not on file   Highest education level: Not on file  Occupational History   Occupation: retired  Tobacco Use   Smoking status: Never   Smokeless tobacco: Never  Substance and Sexual Activity   Alcohol use: No   Drug use: No   Sexual activity: Not Currently  Other Topics Concern   Not on file  Social History Narrative   Not on file   Social Determinants of Health   Financial Resource Strain: Not on file  Food Insecurity: Not on file  Transportation Needs: Not on file  Physical Activity: Not on file  Stress: Not on file  Social Connections: Not on file    Review of Systems  Constitutional: Negative.   Respiratory: Negative.    Cardiovascular: Negative.    Objective:  BP 128/82    Pulse 72    Temp 98.2 F (36.8 C)    Ht '5\' 1"'  (1.549 m)    Wt 174 lb (78.9 kg) Comment: 165 AT HOME   SpO2 99%    BMI 32.88 kg/m   BP/Weight 07/21/2021 03/16/6605 0/0/4599  Systolic BP 774 142 395  Diastolic  BP 82 80 48  Wt. (Lbs) 174 162.6 -  BMI 32.88 27.91 -    Physical Exam Vitals and nursing note reviewed.  Constitutional:      General: She is not in acute distress.    Appearance: Normal appearance. She is not ill-appearing.  HENT:     Head: Normocephalic and atraumatic.  Eyes:     General:        Right eye: No discharge.        Left eye: No discharge.     Conjunctiva/sclera: Conjunctivae normal.  Cardiovascular:     Rate and Rhythm: Normal rate and regular rhythm.     Heart sounds: Murmur heard.  Pulmonary:     Effort: Pulmonary effort is normal.     Breath sounds: Normal breath sounds. No wheezing, rhonchi or rales.  Neurological:     Mental Status: She is alert.  Psychiatric:        Mood and Affect: Mood normal.        Behavior: Behavior normal.    Lab Results  Component Value Date   WBC 6.8 12/18/2016   HGB 12.0 06/04/2019   HCT 35 (A) 06/04/2019   PLT 208 06/04/2019   GLUCOSE 132 (H) 12/21/2016   CHOL 158  06/04/2019   TRIG 110 06/04/2019   HDL 60 06/04/2019   LDLCALC 78 06/04/2019   NA 135 12/21/2016   K 4.3 06/04/2019   CL 94 (A) 06/04/2019   CREATININE 0.96 12/21/2016   BUN 23 (H) 12/21/2016   CO2 24 (A) 06/04/2019   TSH 1.53 06/04/2019   INR 1.15 12/15/2016   HGBA1C 6.0 06/04/2019     Assessment & Plan:   Problem List Items Addressed This Visit       Cardiovascular and Mediastinum   Essential hypertension - Primary    Well-controlled.  Continue nebivolol, and losartan HCTZ.      Relevant Orders   CMP14+EGFR     Musculoskeletal and Integument   Osteopenia     Other   HLD (hyperlipidemia)    Has been well controlled.  Needs current labs.  Labs today.  Continue Lipitor.      Relevant Orders   Lipid panel   Prediabetes   Relevant Orders   CMP14+EGFR   Hemoglobin Q3F   Systolic murmur    Murmur noted on exam.  Planning for echocardiogram in the future.      Other Visit Diagnoses     Need for vaccination       Relevant Orders    Flu Vaccine QUAD 6+ mos PF IM (Fluarix Quad PF) (Completed)   History of anemia       Relevant Orders   CBC      Follow-up:  Return in about 6 months (around 01/18/2022).  [place]

## 2021-07-22 LAB — CMP14+EGFR
ALT: 13 IU/L (ref 0–32)
AST: 22 IU/L (ref 0–40)
Albumin/Globulin Ratio: 1.9 (ref 1.2–2.2)
Albumin: 4.9 g/dL — ABNORMAL HIGH (ref 3.6–4.6)
Alkaline Phosphatase: 63 IU/L (ref 44–121)
BUN/Creatinine Ratio: 15 (ref 12–28)
BUN: 15 mg/dL (ref 8–27)
Bilirubin Total: 0.7 mg/dL (ref 0.0–1.2)
CO2: 26 mmol/L (ref 20–29)
Calcium: 10.2 mg/dL (ref 8.7–10.3)
Chloride: 91 mmol/L — ABNORMAL LOW (ref 96–106)
Creatinine, Ser: 1 mg/dL (ref 0.57–1.00)
Globulin, Total: 2.6 g/dL (ref 1.5–4.5)
Glucose: 147 mg/dL — ABNORMAL HIGH (ref 70–99)
Potassium: 4.2 mmol/L (ref 3.5–5.2)
Sodium: 132 mmol/L — ABNORMAL LOW (ref 134–144)
Total Protein: 7.5 g/dL (ref 6.0–8.5)
eGFR: 56 mL/min/1.73 — ABNORMAL LOW (ref >59)

## 2021-07-22 LAB — LIPID PANEL
Chol/HDL Ratio: 3.1 ratio (ref 0.0–4.4)
Cholesterol, Total: 169 mg/dL (ref 100–199)
HDL: 55 mg/dL (ref >39)
LDL Chol Calc (NIH): 84 mg/dL (ref 0–99)
Triglycerides: 175 mg/dL — ABNORMAL HIGH (ref 0–149)
VLDL Cholesterol Cal: 30 mg/dL (ref 5–40)

## 2021-07-22 LAB — CBC
Hematocrit: 34.9 % (ref 34.0–46.6)
Hemoglobin: 12.3 g/dL (ref 11.1–15.9)
MCH: 32.4 pg (ref 26.6–33.0)
MCHC: 35.2 g/dL (ref 31.5–35.7)
MCV: 92 fL (ref 79–97)
Platelets: 232 10*3/uL (ref 150–450)
RBC: 3.8 x10E6/uL (ref 3.77–5.28)
RDW: 12 % (ref 11.7–15.4)
WBC: 5.3 10*3/uL (ref 3.4–10.8)

## 2021-07-22 LAB — HEMOGLOBIN A1C
Est. average glucose Bld gHb Est-mCnc: 140 mg/dL
Hgb A1c MFr Bld: 6.5 % — ABNORMAL HIGH (ref 4.8–5.6)

## 2021-08-10 ENCOUNTER — Other Ambulatory Visit: Payer: Self-pay | Admitting: Family Medicine

## 2021-08-10 ENCOUNTER — Telehealth: Payer: Self-pay | Admitting: Family Medicine

## 2021-08-10 MED ORDER — PANTOPRAZOLE SODIUM 40 MG PO TBEC: 40.0000 mg | DELAYED_RELEASE_TABLET | Freq: Every day | ORAL | 1 refills | Status: DC

## 2021-08-10 NOTE — Telephone Encounter (Signed)
Pt called re Famotidine rx, [facility]. She states there is a problem with her Famotidine for reflux at the pharmacy. She says it works good in the morning but does not work for her in the evening so she was curious if there was something that would work better.  [phone]

## 2021-08-10 NOTE — Telephone Encounter (Signed)
Patient informed new rx sent to pharmacy. Verbalized understanding.

## 2021-08-22 ENCOUNTER — Other Ambulatory Visit: Payer: Self-pay

## 2021-08-22 MED ORDER — ATORVASTATIN CALCIUM 10 MG PO TABS: ORAL_TABLET | ORAL | 1 refills | Status: DC

## 2021-08-22 MED ORDER — LOSARTAN POTASSIUM-HCTZ 100-25 MG PO TABS: ORAL_TABLET | ORAL | 1 refills | Status: DC

## 2021-08-24 ENCOUNTER — Other Ambulatory Visit: Payer: Self-pay

## 2021-08-24 MED ORDER — NEBIVOLOL HCL 20 MG PO TABS: ORAL_TABLET | ORAL | 1 refills | Status: DC

## 2021-09-12 ENCOUNTER — Other Ambulatory Visit: Payer: Self-pay

## 2021-09-12 ENCOUNTER — Ambulatory Visit (INDEPENDENT_AMBULATORY_CARE_PROVIDER_SITE_OTHER): Payer: Medicare Other

## 2021-09-12 VITALS — Ht 61.0 in | Wt 165.0 lb

## 2021-09-12 DIAGNOSIS — Z0000 Encounter for general adult medical examination without abnormal findings: Secondary | ICD-10-CM

## 2021-09-12 NOTE — Patient Instructions (Signed)
Ms. Julia Choi , Thank you for taking time to come for your Medicare Wellness Visit. I appreciate your ongoing commitment to your health goals. Please review the following plan we discussed and let me know if I can assist you in the future.   Screening recommendations/referrals: Colonoscopy: No longer required due age.  Mammogram: No longer required due to age. Bone Density: Done 03/28/2017 Repeat every 2 years  Recommended yearly ophthalmology/optometry visit for glaucoma screening and checkup Recommended yearly dental visit for hygiene and checkup  Vaccinations: Influenza vaccine: Done 07/21/2021 Repeat annually  Pneumococcal vaccine: Done 04/09/2014 and 05/31/2015 Tdap vaccine: Due Repeat in 10 years  Shingles vaccine: Discussed.   Covid-19:Done 09/09/2019, 10/07/2019,01/05/2022  Advanced directives: Please bring a copy of your health care power of attorney and living will to the office to be added to your chart at your convenience.   Conditions/risks identified: KEEP UP THE GOOD WORK!!  Next appointment: Follow up in one year for your annual wellness visit 2024.   Preventive Care 42 Years and Older, Female Preventive care refers to lifestyle choices and visits with your health care provider that can promote health and wellness. What does preventive care include? A yearly physical exam. This is also called an annual well check. Dental exams once or twice a year. Routine eye exams. Ask your health care provider how often you should have your eyes checked. Personal lifestyle choices, including: Daily care of your teeth and gums. Regular physical activity. Eating a healthy diet. Avoiding tobacco and drug use. Limiting alcohol use. Practicing safe sex. Taking low-dose aspirin every day. Taking vitamin and mineral supplements as recommended by your health care provider. What happens during an annual well check? The services and screenings done by your health care provider during your  annual well check will depend on your age, overall health, lifestyle risk factors, and family history of disease. Counseling  Your health care provider may ask you questions about your: Alcohol use. Tobacco use. Drug use. Emotional well-being. Home and relationship well-being. Sexual activity. Eating habits. History of falls. Memory and ability to understand (cognition). Work and work Astronomer. Reproductive health. Screening  You may have the following tests or measurements: Height, weight, and BMI. Blood pressure. Lipid and cholesterol levels. These may be checked every 5 years, or more frequently if you are over 78 years old. Skin check. Lung cancer screening. You may have this screening every year starting at age 23 if you have a 30-pack-year history of smoking and currently smoke or have quit within the past 15 years. Fecal occult blood test (FOBT) of the stool. You may have this test every year starting at age 87. Flexible sigmoidoscopy or colonoscopy. You may have a sigmoidoscopy every 5 years or a colonoscopy every 10 years starting at age 30. Hepatitis C blood test. Hepatitis B blood test. Sexually transmitted disease (STD) testing. Diabetes screening. This is done by checking your blood sugar (glucose) after you have not eaten for a while (fasting). You may have this done every 1-3 years. Bone density scan. This is done to screen for osteoporosis. You may have this done starting at age 25. Mammogram. This may be done every 1-2 years. Talk to your health care provider about how often you should have regular mammograms. Talk with your health care provider about your test results, treatment options, and if necessary, the need for more tests. Vaccines  Your health care provider may recommend certain vaccines, such as: Influenza vaccine. This is recommended every year. Tetanus,  diphtheria, and acellular pertussis (Tdap, Td) vaccine. You may need a Td booster every 10  years. Zoster vaccine. You may need this after age 80. Pneumococcal 13-valent conjugate (PCV13) vaccine. One dose is recommended after age 50. Pneumococcal polysaccharide (PPSV23) vaccine. One dose is recommended after age 55. Talk to your health care provider about which screenings and vaccines you need and how often you need them. This information is not intended to replace advice given to you by your health care provider. Make sure you discuss any questions you have with your health care provider. Document Released: 07/29/2015 Document Revised: 03/21/2016 Document Reviewed: 05/03/2015 Elsevier Interactive Patient Education  2017 [employer] Prevention in the Home Falls can cause injuries. They can happen to people of all ages. There are many things you can do to make your home safe and to help prevent falls. What can I do on the outside of my home? Regularly fix the edges of walkways and driveways and fix any cracks. Remove anything that might make you trip as you walk through a door, such as a raised step or threshold. Trim any bushes or trees on the path to your home. Use bright outdoor lighting. Clear any walking paths of anything that might make someone trip, such as rocks or tools. Regularly check to see if handrails are loose or broken. Make sure that both sides of any steps have handrails. Any raised decks and porches should have guardrails on the edges. Have any leaves, snow, or ice cleared regularly. Use sand or salt on walking paths during winter. Clean up any spills in your garage right away. This includes oil or grease spills. What can I do in the bathroom? Use night lights. Install grab bars by the toilet and in the tub and shower. Do not use towel bars as grab bars. Use non-skid mats or decals in the tub or shower. If you need to sit down in the shower, use a plastic, non-slip stool. Keep the floor dry. Clean up any water that spills on the floor as soon as it  happens. Remove soap buildup in the tub or shower regularly. Attach bath mats securely with double-sided non-slip rug tape. Do not have throw rugs and other things on the floor that can make you trip. What can I do in the bedroom? Use night lights. Make sure that you have a light by your bed that is easy to reach. Do not use any sheets or blankets that are too big for your bed. They should not hang down onto the floor. Have a firm chair that has side arms. You can use this for support while you get dressed. Do not have throw rugs and other things on the floor that can make you trip. What can I do in the kitchen? Clean up any spills right away. Avoid walking on wet floors. Keep items that you use a lot in easy-to-reach places. If you need to reach something above you, use a strong step stool that has a grab bar. Keep electrical cords out of the way. Do not use floor polish or wax that makes floors slippery. If you must use wax, use non-skid floor wax. Do not have throw rugs and other things on the floor that can make you trip. What can I do with my stairs? Do not leave any items on the stairs. Make sure that there are handrails on both sides of the stairs and use them. Fix handrails that are broken or loose. Make  sure that handrails are as long as the stairways. Check any carpeting to make sure that it is firmly attached to the stairs. Fix any carpet that is loose or worn. Avoid having throw rugs at the top or bottom of the stairs. If you do have throw rugs, attach them to the floor with carpet tape. Make sure that you have a light switch at the top of the stairs and the bottom of the stairs. If you do not have them, ask someone to add them for you. What else can I do to help prevent falls? Wear shoes that: Do not have high heels. Have rubber bottoms. Are comfortable and fit you well. Are closed at the toe. Do not wear sandals. If you use a stepladder: Make sure that it is fully opened.  Do not climb a closed stepladder. Make sure that both sides of the stepladder are locked into place. Ask someone to hold it for you, if possible. Clearly mark and make sure that you can see: Any grab bars or handrails. First and last steps. Where the edge of each step is. Use tools that help you move around (mobility aids) if they are needed. These include: Canes. Walkers. Scooters. Crutches. Turn on the lights when you go into a dark area. Replace any light bulbs as soon as they burn out. Set up your furniture so you have a clear path. Avoid moving your furniture around. If any of your floors are uneven, fix them. If there are any pets around you, be aware of where they are. Review your medicines with your doctor. Some medicines can make you feel dizzy. This can increase your chance of falling. Ask your doctor what other things that you can do to help prevent falls. This information is not intended to replace advice given to you by your health care provider. Make sure you discuss any questions you have with your health care provider. Document Released: 04/28/2009 Document Revised: 12/08/2015 Document Reviewed: 08/06/2014 Elsevier Interactive Patient Education  2017 [employer].

## 2021-09-12 NOTE — Progress Notes (Signed)
Subjective:   Julia Choi is a 86 y.o. female who presents for an Initial Medicare Annual Wellness Visit. Virtual Visit via Telephone Note  I connected with  Julia Choi on 09/12/21 at 10:20 AM EST by telephone and verified that I am speaking with the correct person using two identifiers.  Location: Patient: HOME  Provider: [facility] Persons participating in the virtual visit: patient/Nurse Health Advisor   I discussed the limitations, risks, security and privacy concerns of performing an evaluation and management service by telephone and the availability of in person appointments. The patient expressed understanding and agreed to proceed.  Interactive audio and video telecommunications were attempted between this nurse and patient, however failed, due to patient having technical difficulties OR patient did not have access to video capability.  We continued and completed visit with audio only.  Some vital signs may be absent or patient reported.   Julia Dash, LPN  Review of Systems     Cardiac Risk Factors include: advanced age (>1men, >27 women);hypertension;dyslipidemia;sedentary lifestyle;obesity (BMI >30kg/m2)     Objective:    Today's Vitals   09/12/21 1032  Weight: 165 lb (74.8 kg)  Height: 5\' 1"  (1.549 Julia)   Body mass index is 31.18 kg/Julia.  Advanced Directives 09/12/2021 12/15/2016  Does Patient Have a Medical Advance Directive? Yes Yes  Type of Estate agent of [place];Living will Healthcare Power of Julia Choi;Living will  Does patient want to make changes to medical advance directive? - No - Patient declined  Copy of Healthcare Power of Attorney in Chart? Yes - validated most recent copy scanned in chart (See row information) No - copy requested    Current Medications (verified) Outpatient Encounter Medications as of 09/12/2021  Medication Sig   atorvastatin (LIPITOR) 10 MG tablet Take 10 mg by mouth every evening.   cetirizine  (ZYRTEC) 10 MG tablet Take 10 mg by mouth daily as needed for allergies.   famotidine (PEPCID) 20 MG tablet Take 20 mg by mouth 2 (two) times daily.   fluorouracil (EFUDEX) 5 % cream fluorouracil 5 % topical cream  APPLY TO AFFECTED AREAS TWICE A DAY FOR 2 WEEKS THEN LEAVE OFF FOR 2 WEEKS AND REPEAT   losartan-hydrochlorothiazide (HYZAAR) 100-25 MG tablet Take one tablet po every day   Nebivolol HCl 20 MG TABS Take one tablet po every day   [DISCONTINUED] pantoprazole (PROTONIX) 40 MG tablet Take 1 tablet (40 mg total) by mouth daily.   No facility-administered encounter medications on file as of 09/12/2021.    Allergies (verified) Prednisone   History: Past Medical History:  Diagnosis Date   Hypertension    Reflux    cholesterol, hyn   Past Surgical History:  Procedure Laterality Date   INTRAMEDULLARY (IM) NAIL INTERTROCHANTERIC Right 12/16/2016   Procedure: INTRAMEDULLARY (IM) NAIL INTERTROCHANTRIC;  Surgeon: Julia Hearing, MD;  Location: [facility];  Service: Orthopedics;  Laterality: Right;   JOINT REPLACEMENT     History reviewed. No pertinent family history. Social History   Socioeconomic History   Marital status: Widowed    Spouse name: Not on file   Number of children: 1   Years of education: Not on file   Highest education level: Not on file  Occupational History   Occupation: retired  Tobacco Use   Smoking status: Never   Smokeless tobacco: Never  Substance and Sexual Activity   Alcohol use: No   Drug use: No   Sexual activity: Not Currently  Other  Topics Concern   Not on file  Social History Narrative   Widowed since 2018   1 son, [place]   2 grandchildren   1 great granddaughter.   Social Determinants of Health   Financial Resource Strain: Low Risk    Difficulty of Paying Living Expenses: Not hard at all  Food Insecurity: No Food Insecurity   Worried About Programme researcher, broadcasting/film/videounning Out of Food in the Last Year: Never true   Ran Out of Food in the Last  Year: Never true  Transportation Needs: No Transportation Needs   Lack of Transportation (Medical): No   Lack of Transportation (Non-Medical): No  Physical Activity: Sufficiently Active   Days of Exercise per Week: 5 days   Minutes of Exercise per Session: 30 min  Stress: No Stress Concern Present   Feeling of Stress : Not at all  Social Connections: Moderately Integrated   Frequency of Communication with Friends and Family: More than three times a week   Frequency of Social Gatherings with Friends and Family: Once a week   Attends Religious Services: 1 to 4 times per year   Active Member of [employer] or Organizations: No   Attends BankerClub or Organization Meetings: 1 to 4 times per year   Marital Status: Widowed    Tobacco Counseling Counseling given: Not Answered   Clinical Intake:  Pre-visit preparation completed: Yes  Pain : No/denies pain     BMI - recorded: 31.18 Nutritional Status: BMI > 30  Obese Nutritional Risks: None Diabetes: No  How often do you need to have someone help you when you read instructions, pamphlets, or other written materials from your doctor or pharmacy?: 1 - Never  Diabetic?No  Interpreter Needed?: No  Information entered by :: mj Alexx Mcburney, lpn   Activities of Daily Living In your present state of health, do you have any difficulty performing the following activities: 09/12/2021  Choi? N  Vision? N  Difficulty concentrating or making decisions? N  Walking or climbing stairs? N  Dressing or bathing? N  Doing errands, shopping? N  Preparing Food and eating ? N  Using the Toilet? N  In the past six months, have you accidently leaked urine? N  Do you have problems with loss of bowel control? N  Managing your Medications? N  Managing your Finances? N  Housekeeping or managing your Housekeeping? N  Some recent data might be hidden    Patient Care Team: Julia Choi, Julia G, DO as PCP - General (Family Medicine)  Indicate any recent Medical  Services you may have received from other than [facility] providers in the past year (date may be approximate).     Assessment:   This is a routine wellness examination for Julia Choi.  Choi/Vision screen Choi Screening - Comments:: Some Choi issues.  Vision Screening - Comments:: [facility]. [facility] [place]. 08/2021.  Dietary issues and exercise activities discussed: Current Exercise Habits: Home exercise routine, Type of exercise: walking, Time (Minutes): 30, Frequency (Times/Week): 5, Weekly Exercise (Minutes/Week): 150, Intensity: Mild, Exercise limited by: orthopedic condition(s);cardiac condition(s)   Goals Addressed             This Visit's Progress    Exercise 3x per week (30 min per time)       Continue to exercise and stay healthy       Depression Screen PHQ 2/9 Scores 09/12/2021 07/21/2021 08/25/2019  PHQ - 2 Score 0 0 0  Exception Documentation - - Medical reason  Fall Risk Fall Risk  09/12/2021 07/21/2021 08/25/2019  Falls in the past year? 0 0 0  Number falls in past yr: 0 0 0  Injury with Fall? 0 0 0  Risk for fall due to : Impaired balance/gait No Fall Risks -  Follow up Falls prevention discussed Falls evaluation completed -    FALL RISK PREVENTION PERTAINING TO THE HOME:  Any stairs in or around the home? Yes  If so, are there any without handrails? No  Home free of loose throw rugs in walkways, pet beds, electrical cords, etc? Yes  Adequate lighting in your home to reduce risk of falls? Yes   ASSISTIVE DEVICES UTILIZED TO PREVENT FALLS:  Life alert? Yes  Use of a cane, walker or w/c? No  Grab bars in the bathroom? Yes  Shower chair or bench in shower? Yes  Elevated toilet seat or a handicapped toilet? Yes   TIMED UP AND GO:  Was the test performed? No .  Phone visit.   Cognitive Function:     6CIT Screen 09/12/2021  What Year? 0 points  What month? 0 points  What time? 0 points  Count back from 20 0 points  Months in reverse 0  points  Repeat phrase 0 points  Total Score 0    Immunizations Immunization History  Administered Date(s) Administered   Influenza,inj,Quad PF,6+ Mos 07/21/2021   Influenza-Unspecified 05/10/2017, 04/23/2018, 06/09/2019   Moderna Covid-19 Vaccine Bivalent Booster 75yrs & up 01/05/2021   Moderna Sars-Covid-2 Vaccination 09/09/2019, 10/07/2019   Pneumococcal Conjugate-13 04/09/2014   Pneumococcal Polysaccharide-23 05/31/2015    TDAP status: Due, Education has been provided regarding the importance of this vaccine. Advised may receive this vaccine at local pharmacy or Health Dept. Aware to provide a copy of the vaccination record if obtained from local pharmacy or Health Dept. Verbalized acceptance and understanding.  Flu Vaccine status: Up to date  Pneumococcal vaccine status: Up to date  Covid-19 vaccine status: Completed vaccines  Qualifies for Shingles Vaccine? Yes   Zostavax completed No   Shingrix Completed?: No.    Education has been provided regarding the importance of this vaccine. Patient has been advised to call insurance company to determine out of pocket expense if they have not yet received this vaccine. Advised may also receive vaccine at local pharmacy or Health Dept. Verbalized acceptance and understanding.  Screening Tests Health Maintenance  Topic Date Due   TETANUS/TDAP  Never done   Zoster Vaccines- Shingrix (1 of 2) Never done   COVID-19 Vaccine (3 - Moderna risk series) 01/05/2021   Pneumonia Vaccine 39+ Years old  Completed   INFLUENZA VACCINE  Completed   DEXA SCAN  Completed   HPV VACCINES  Aged Out    Health Maintenance  Health Maintenance Due  Topic Date Due   TETANUS/TDAP  Never done   Zoster Vaccines- Shingrix (1 of 2) Never done   COVID-19 Vaccine (3 - Moderna risk series) 01/05/2021    Colorectal cancer screening: No longer required.   Mammogram status: No longer required due to age.  Bone Density status: Completed 03/28/2017. Results  reflect: Bone density results: OSTEOPENIA. Repeat every 2 years.  Lung Cancer Screening: (Low Dose CT Chest recommended if Age 30-80 years, 30 pack-year currently smoking OR have quit w/in 15years.) does not qualify.    Additional Screening:  Hepatitis C Screening: does not qualify;   Vision Screening: Recommended annual ophthalmology exams for early detection of glaucoma and other disorders of the eye. Is  the patient up to date with their annual eye exam?  Yes  Who is the provider or what is the name of the office in which the patient attends annual eye exams? [facility] If pt is not established with a provider, would they like to be referred to a provider to establish care? No .   Dental Screening: Recommended annual dental exams for proper oral hygiene  Community Resource Referral / Chronic Care Management: CRR required this visit?  No   CCM required this visit?  No      Plan:     I have personally reviewed and noted the following in the patients chart:   Medical and social history Use of alcohol, tobacco or illicit drugs  Current medications and supplements including opioid prescriptions. Patient is not currently taking opioid prescriptions. Functional ability and status Nutritional status Physical activity Advanced directives List of other physicians Hospitalizations, surgeries, and ER visits in previous 12 months Vitals Screenings to include cognitive, depression, and falls Referrals and appointments  In addition, I have reviewed and discussed with patient certain preventive protocols, quality metrics, and best practice recommendations. A written personalized care plan for preventive services as well as general preventive health recommendations were provided to patient.     Julia Dash, LPN   10/22/8117   Nurse Notes: Pt is up to date on all age appropriate health maintenance. Discussed Shingrix and how to obtain.

## 2022-01-19 ENCOUNTER — Other Ambulatory Visit: Payer: Self-pay | Admitting: Family Medicine

## 2022-01-19 ENCOUNTER — Ambulatory Visit: Payer: Medicare Other | Admitting: Family Medicine

## 2022-01-19 VITALS — BP 121/74 | HR 68 | Temp 98.4°F | Ht 61.0 in | Wt 174.6 lb

## 2022-01-19 DIAGNOSIS — F5101 Primary insomnia: Secondary | ICD-10-CM

## 2022-01-19 DIAGNOSIS — E119 Type 2 diabetes mellitus without complications: Secondary | ICD-10-CM

## 2022-01-19 DIAGNOSIS — M25551 Pain in right hip: Secondary | ICD-10-CM | POA: Insufficient documentation

## 2022-01-19 DIAGNOSIS — I10 Essential (primary) hypertension: Secondary | ICD-10-CM

## 2022-01-19 DIAGNOSIS — E785 Hyperlipidemia, unspecified: Secondary | ICD-10-CM

## 2022-01-19 DIAGNOSIS — G4700 Insomnia, unspecified: Secondary | ICD-10-CM | POA: Insufficient documentation

## 2022-01-19 MED ORDER — TRAZODONE HCL 50 MG PO TABS: 50.0000 mg | ORAL_TABLET | Freq: Every evening | ORAL | 3 refills | Status: DC | PRN

## 2022-01-19 NOTE — Progress Notes (Signed)
Subjective:  Patient ID: Julia Choi, female    DOB: 1935/10/22  Age: 86 y.o. MRN: [id number]  CC: Chief Complaint  Patient presents with   Hypertension   Hyperlipidemia    HPI:  86 year old female with hypertension, recent diagnosis of type 2 diabetes, osteopenia, hyperlipidemia presents for follow-up.  Patient reports that she has been having trouble falling asleep and staying asleep.  She states that she has been dealing with this for years.  She is interested in discussing pharmacotherapy.  She states that a friend of hers takes trazodone.  She would like to discuss this today.  Patient has had prior hip fracture.  She states that at times she needs a walker to help her ambulate.  Gilmer Mor does not provide enough support.  She would like a prescription for this.  Patient's hypertension is well controlled on losartan/HCTZ and nebivolol.  Lipids have been stable on atorvastatin.  Recent A1c 6.5.  Needs repeat.  No pharmacotherapy at this time.  Patient Active Problem List   Diagnosis Date Noted   Type 2 diabetes mellitus (HCC) 01/19/2022   Insomnia 01/19/2022   Right hip pain 01/19/2022   Osteopenia 07/21/2021   Systolic murmur 07/21/2021   Gastroesophageal reflux disease 12/28/2020   Essential hypertension 12/15/2016   HLD (hyperlipidemia) 12/15/2016    Social Hx   Social History   Socioeconomic History   Marital status: Widowed    Spouse name: Not on file   Number of children: 1   Years of education: Not on file   Highest education level: Not on file  Occupational History   Occupation: retired  Tobacco Use   Smoking status: Never   Smokeless tobacco: Never  Substance and Sexual Activity   Alcohol use: No   Drug use: No   Sexual activity: Not Currently  Other Topics Concern   Not on file  Social History Narrative   Widowed since 2018   1 son, lives in [place]   2 grandchildren   1 great granddaughter.   Social Determinants of Health   Financial  Resource Strain: Low Risk  (09/12/2021)   Overall Financial Resource Strain (CARDIA)    Difficulty of Paying Living Expenses: Not hard at all  Food Insecurity: No Food Insecurity (09/12/2021)   Hunger Vital Sign    Worried About Running Out of Food in the Last Year: Never true    Ran Out of Food in the Last Year: Never true  Transportation Needs: No Transportation Needs (09/12/2021)   PRAPARE - Administrator, Civil Service (Medical): No    Lack of Transportation (Non-Medical): No  Physical Activity: Sufficiently Active (09/12/2021)   Exercise Vital Sign    Days of Exercise per Week: 5 days    Minutes of Exercise per Session: 30 min  Stress: No Stress Concern Present (09/12/2021)   [employer] of Occupational Health - Occupational Stress Questionnaire    Feeling of Stress : Not at all  Social Connections: Moderately Integrated (09/12/2021)   Social Connection and Isolation Panel [NHANES]    Frequency of Communication with Friends and Family: More than three times a week    Frequency of Social Gatherings with Friends and Family: Once a week    Attends Religious Services: 1 to 4 times per year    Active Member of [employer] or Organizations: No    Attends Banker Meetings: 1 to 4 times per year    Marital Status: Widowed    Review  of Systems Per HPI  Objective:  BP 121/74   Pulse 68   Temp 98.4 F (36.9 C) (Oral)   Ht 5\' 1"  (1.549 m)   Wt 174 lb 9.6 oz (79.2 kg)   SpO2 97%   BMI 32.99 kg/m      01/19/2022   10:12 AM 09/12/2021   10:32 AM 07/21/2021    9:44 AM  BP/Weight  Systolic BP 121  09/18/2021  Diastolic BP 74  82  Wt. (Lbs) 174.6 165 174  BMI 32.99 kg/m2 31.18 kg/m2 32.88 kg/m2    Physical Exam  Lab Results  Component Value Date   WBC 5.3 07/21/2021   HGB 12.3 07/21/2021   HCT 34.9 07/21/2021   PLT 232 07/21/2021   GLUCOSE 147 (H) 07/21/2021   CHOL 169 07/21/2021   TRIG 175 (H) 07/21/2021   HDL 55 07/21/2021   LDLCALC 84 07/21/2021   ALT  13 07/21/2021   AST 22 07/21/2021   NA 132 (L) 07/21/2021   K 4.2 07/21/2021   CL 91 (L) 07/21/2021   CREATININE 1.00 07/21/2021   BUN 15 07/21/2021   CO2 26 07/21/2021   TSH 1.53 06/04/2019   INR 1.15 12/15/2016   HGBA1C 6.5 (H) 07/21/2021     Assessment & Plan:   Problem List Items Addressed This Visit       Cardiovascular and Mediastinum   Essential hypertension    BP stable.  Continue nebivolol and losartan/HCTZ.        Endocrine   Type 2 diabetes mellitus (HCC)    Repeating A1c.  Diet controlled at this time.      Relevant Orders   Hemoglobin A1c   Basic Metabolic Panel     Other   Right hip pain    Rx given for walker.      Relevant Orders   For home use only DME 4 wheeled rolling walker with seat 09/18/2021)   Insomnia - Primary    Trial of trazodone.      HLD (hyperlipidemia)    Stable.  Continue Lipitor.       Meds ordered this encounter  Medications   traZODone (DESYREL) 50 MG tablet    Sig: Take 1-2 tablets (50-100 mg total) by mouth at bedtime as needed for sleep.    Dispense:  30 tablet    Refill:  3    Follow-up: 6 months  Tryson Lumley ([id number] DO [facility]

## 2022-01-19 NOTE — Patient Instructions (Signed)
Labs today.  Medication as directed.  Follow up in 6 months.  Take care  Dr. Adriana Simas

## 2022-01-19 NOTE — Assessment & Plan Note (Signed)
Stable. Continue Lipitor. 

## 2022-01-19 NOTE — Assessment & Plan Note (Signed)
Rx given for walker.

## 2022-01-19 NOTE — Assessment & Plan Note (Signed)
Trial of trazodone. 

## 2022-01-19 NOTE — Assessment & Plan Note (Signed)
Repeating A1c.  Diet controlled at this time.

## 2022-01-19 NOTE — Assessment & Plan Note (Signed)
BP stable.  Continue nebivolol and losartan/HCTZ.

## 2022-01-20 LAB — HEMOGLOBIN A1C
Est. average glucose Bld gHb Est-mCnc: 140 mg/dL
Hgb A1c MFr Bld: 6.5 % — ABNORMAL HIGH (ref 4.8–5.6)

## 2022-01-20 LAB — BASIC METABOLIC PANEL
BUN/Creatinine Ratio: 13 (ref 12–28)
BUN: 13 mg/dL (ref 8–27)
CO2: 25 mmol/L (ref 20–29)
Calcium: 10.2 mg/dL (ref 8.7–10.3)
Chloride: 92 mmol/L — ABNORMAL LOW (ref 96–106)
Creatinine, Ser: 1 mg/dL (ref 0.57–1.00)
Glucose: 124 mg/dL — ABNORMAL HIGH (ref 70–99)
Potassium: 4.6 mmol/L (ref 3.5–5.2)
Sodium: 132 mmol/L — ABNORMAL LOW (ref 134–144)
eGFR: 56 mL/min/{1.73_m2} — ABNORMAL LOW (ref >59)

## 2022-02-13 ENCOUNTER — Other Ambulatory Visit: Payer: Self-pay | Admitting: Family Medicine

## 2022-02-28 ENCOUNTER — Other Ambulatory Visit: Payer: Self-pay | Admitting: Family Medicine

## 2022-04-05 ENCOUNTER — Other Ambulatory Visit: Payer: Self-pay | Admitting: Family Medicine

## 2022-05-03 ENCOUNTER — Other Ambulatory Visit: Payer: Self-pay | Admitting: Family Medicine

## 2022-07-18 ENCOUNTER — Other Ambulatory Visit: Payer: Self-pay | Admitting: Family Medicine

## 2022-07-23 ENCOUNTER — Ambulatory Visit: Payer: Medicare Other | Admitting: Family Medicine

## 2022-07-23 DIAGNOSIS — Z130 Encounter for screening for diseases of the blood and blood-forming organs and certain disorders involving the immune mechanism: Secondary | ICD-10-CM

## 2022-07-23 DIAGNOSIS — M8580 Other specified disorders of bone density and structure, unspecified site: Secondary | ICD-10-CM

## 2022-07-23 DIAGNOSIS — E119 Type 2 diabetes mellitus without complications: Secondary | ICD-10-CM | POA: Diagnosis not present

## 2022-07-23 DIAGNOSIS — I10 Essential (primary) hypertension: Secondary | ICD-10-CM

## 2022-07-23 DIAGNOSIS — E785 Hyperlipidemia, unspecified: Secondary | ICD-10-CM | POA: Diagnosis not present

## 2022-07-23 DIAGNOSIS — F5101 Primary insomnia: Secondary | ICD-10-CM

## 2022-07-23 NOTE — Assessment & Plan Note (Addendum)
Uncontrolled.  Stopping trazodone.  Advised against additional pharmacotherapy at this time.  Information on sleep hygiene given.

## 2022-07-23 NOTE — Progress Notes (Signed)
Subjective:  Patient ID: Julia Choi, female    DOB: 26-Jul-1935  Age: 87 y.o. MRN: [id number]  CC: Chief Complaint  Patient presents with   Follow-up    Patient having trouble sleeping. Patient would like to discuss trazodone, she has concerns about this medication.    HPI:  87 year old female with the below mentioned medical problems presents for follow-up.  Patient continues to have trouble with insomnia.  She states that trazodone has not helped significantly.  She feels like it causes dry mouth.  Will discuss today.  Patient is overdue for health maintenance items.  Declines influenza vaccine.  Needs A1c and also needs urine ACR.  She states that she had a relatively recent eye exam in 2023.  She is unsure of the exact time.  Was done at [facility] in [place].  Needs foot exam today.  Needs DEXA scan.  Hypertension is well-controlled on losartan/HCTZ and nebivolol.  A1c has been at goal.  No pharmacotherapy regarding her diabetes at this time.  Lipids have been fairly well-controlled.  Needs lipid panel.  Remains on Lipitor.  No reported issues.  Patient Active Problem List   Diagnosis Date Noted   Type 2 diabetes mellitus (HCC) 01/19/2022   Insomnia 01/19/2022   Osteopenia 07/21/2021   Systolic murmur 07/21/2021   Gastroesophageal reflux disease 12/28/2020   Essential hypertension 12/15/2016   HLD (hyperlipidemia) 12/15/2016    Social Hx   Social History   Socioeconomic History   Marital status: Widowed    Spouse name: Not on file   Number of children: 1   Years of education: Not on file   Highest education level: Not on file  Occupational History   Occupation: retired  Tobacco Use   Smoking status: Never   Smokeless tobacco: Never  Substance and Sexual Activity   Alcohol use: No   Drug use: No   Sexual activity: Not Currently  Other Topics Concern   Not on file  Social History Narrative   Widowed since 2018   1 son, lives in [place]   2  grandchildren   1 great granddaughter.   Social Determinants of Health   Financial Resource Strain: Low Risk  (09/12/2021)   Overall Financial Resource Strain (CARDIA)    Difficulty of Paying Living Expenses: Not hard at all  Food Insecurity: No Food Insecurity (09/12/2021)   Hunger Vital Sign    Worried About Running Out of Food in the Last Year: Never true    Ran Out of Food in the Last Year: Never true  Transportation Needs: No Transportation Needs (09/12/2021)   PRAPARE - Administrator, Civil Service (Medical): No    Lack of Transportation (Non-Medical): No  Physical Activity: Sufficiently Active (09/12/2021)   Exercise Vital Sign    Days of Exercise per Week: 5 days    Minutes of Exercise per Session: 30 min  Stress: No Stress Concern Present (09/12/2021)   [employer] of Occupational Health - Occupational Stress Questionnaire    Feeling of Stress : Not at all  Social Connections: Moderately Integrated (09/12/2021)   Social Connection and Isolation Panel [NHANES]    Frequency of Communication with Friends and Family: More than three times a week    Frequency of Social Gatherings with Friends and Family: Once a week    Attends Religious Services: 1 to 4 times per year    Active Member of [employer] or Organizations: No    Attends Banker  Meetings: 1 to 4 times per year    Marital Status: Widowed    Review of Systems Per HPI  Objective:  BP 138/88   Pulse 88   Temp 98.4 F (36.9 C) (Oral)   Wt 170 lb 9.6 oz (77.4 kg)   SpO2 96%   BMI 32.23 kg/m      07/23/2022   10:18 AM 01/19/2022   10:12 AM 09/12/2021   10:32 AM  BP/Weight  Systolic BP 076 226   Diastolic BP 88 74   Wt. (Lbs) 170.6 174.6 165  BMI 32.23 kg/m2 32.99 kg/m2 31.18 kg/m2    Physical Exam Vitals and nursing note reviewed.  Constitutional:      General: She is not in acute distress.    Appearance: Normal appearance. She is obese.  HENT:     Head: Normocephalic and  atraumatic.  Cardiovascular:     Rate and Rhythm: Normal rate and regular rhythm.     Heart sounds: Murmur heard.  Pulmonary:     Effort: Pulmonary effort is normal.     Breath sounds: Normal breath sounds. No wheezing, rhonchi or rales.  Neurological:     Mental Status: She is alert.  Psychiatric:        Mood and Affect: Mood normal.        Behavior: Behavior normal.     Lab Results  Component Value Date   WBC 5.3 07/21/2021   HGB 12.3 07/21/2021   HCT 34.9 07/21/2021   PLT 232 07/21/2021   GLUCOSE 124 (H) 01/19/2022   CHOL 169 07/21/2021   TRIG 175 (H) 07/21/2021   HDL 55 07/21/2021   LDLCALC 84 07/21/2021   ALT 13 07/21/2021   AST 22 07/21/2021   NA 132 (L) 01/19/2022   K 4.6 01/19/2022   CL 92 (L) 01/19/2022   CREATININE 1.00 01/19/2022   BUN 13 01/19/2022   CO2 25 01/19/2022   TSH 1.53 06/04/2019   INR 1.15 12/15/2016   HGBA1C 6.5 (H) 01/19/2022     Assessment & Plan:   Problem List Items Addressed This Visit       Cardiovascular and Mediastinum   Essential hypertension    Stable.  Continue current medications.        Endocrine   Type 2 diabetes mellitus ([place])    Foot performed today.  Labs ordered.      Relevant Orders   CMP14+EGFR   Hemoglobin A1c   Microalbumin / creatinine urine ratio     Musculoskeletal and Integument   Osteopenia   Relevant Orders   DG Bone Density     Other   Insomnia    Uncontrolled.  Stopping trazodone.  Advised against additional pharmacotherapy at this time.  Information on sleep hygiene given.      HLD (hyperlipidemia)    Stable.  Continue Lipitor.      Relevant Orders   Lipid panel   Other Visit Diagnoses     Screening for deficiency anemia       Relevant Orders   CBC       Follow-up:  Return in about 6 months (around 01/21/2023).  [place]

## 2022-07-23 NOTE — Assessment & Plan Note (Signed)
Foot performed today.  Labs ordered.

## 2022-07-23 NOTE — Assessment & Plan Note (Signed)
Stable.  Continue current medications.

## 2022-07-23 NOTE — Assessment & Plan Note (Signed)
Stable. Continue Lipitor. 

## 2022-07-23 NOTE — Patient Instructions (Signed)
Stop trazodone.  Labs and Urine ordered.  Follow up in 6 months.  Take care  Dr. Lacinda Axon

## 2022-07-24 LAB — CMP14+EGFR
ALT: 13 IU/L (ref 0–32)
AST: 21 IU/L (ref 0–40)
Albumin/Globulin Ratio: 2.1 (ref 1.2–2.2)
Albumin: 5 g/dL — ABNORMAL HIGH (ref 3.7–4.7)
Alkaline Phosphatase: 62 IU/L (ref 44–121)
BUN/Creatinine Ratio: 14 (ref 12–28)
BUN: 13 mg/dL (ref 8–27)
Bilirubin Total: 0.9 mg/dL (ref 0.0–1.2)
CO2: 25 mmol/L (ref 20–29)
Calcium: 10.4 mg/dL — ABNORMAL HIGH (ref 8.7–10.3)
Chloride: 93 mmol/L — ABNORMAL LOW (ref 96–106)
Creatinine, Ser: 0.94 mg/dL (ref 0.57–1.00)
Globulin, Total: 2.4 g/dL (ref 1.5–4.5)
Glucose: 141 mg/dL — ABNORMAL HIGH (ref 70–99)
Potassium: 4.1 mmol/L (ref 3.5–5.2)
Sodium: 135 mmol/L (ref 134–144)
Total Protein: 7.4 g/dL (ref 6.0–8.5)
eGFR: 60 mL/min/{1.73_m2} (ref >59)

## 2022-07-24 LAB — LIPID PANEL
Chol/HDL Ratio: 2.7 ratio (ref 0.0–4.4)
Cholesterol, Total: 176 mg/dL (ref 100–199)
HDL: 65 mg/dL (ref >39)
LDL Chol Calc (NIH): 90 mg/dL (ref 0–99)
Triglycerides: 118 mg/dL (ref 0–149)
VLDL Cholesterol Cal: 21 mg/dL (ref 5–40)

## 2022-07-24 LAB — HEMOGLOBIN A1C
Est. average glucose Bld gHb Est-mCnc: 140 mg/dL
Hgb A1c MFr Bld: 6.5 % — ABNORMAL HIGH (ref 4.8–5.6)

## 2022-07-24 LAB — CBC
Hematocrit: 34.9 % (ref 34.0–46.6)
Hemoglobin: 12.1 g/dL (ref 11.1–15.9)
MCH: 32.8 pg (ref 26.6–33.0)
MCHC: 34.7 g/dL (ref 31.5–35.7)
MCV: 95 fL (ref 79–97)
Platelets: 245 10*3/uL (ref 150–450)
RBC: 3.69 x10E6/uL — ABNORMAL LOW (ref 3.77–5.28)
RDW: 12.4 % (ref 11.7–15.4)
WBC: 6.1 10*3/uL (ref 3.4–10.8)

## 2022-07-24 LAB — MICROALBUMIN / CREATININE URINE RATIO
Creatinine, Urine: 128.8 mg/dL
Microalb/Creat Ratio: 23 mg/g creat (ref 0–29)
Microalbumin, Urine: 30.1 ug/mL

## 2022-07-28 ENCOUNTER — Ambulatory Visit
Admission: EM | Admit: 2022-07-28 | Discharge: 2022-07-28 | Disposition: A | Discharge status: Home / Self Care | Payer: Medicare Other | Attending: Family Medicine | Admitting: Family Medicine

## 2022-07-28 DIAGNOSIS — M79605 Pain in left leg: Secondary | ICD-10-CM

## 2022-07-28 DIAGNOSIS — S76012A Strain of muscle, fascia and tendon of left hip, initial encounter: Secondary | ICD-10-CM

## 2022-07-28 MED ORDER — TIZANIDINE HCL 2 MG PO CAPS: 2.0000 mg | ORAL_CAPSULE | Freq: Every evening | ORAL | 0 refills | Status: DC | PRN

## 2022-07-28 MED ORDER — DEXAMETHASONE SODIUM PHOSPHATE 10 MG/ML IJ SOLN
10.0000 mg | Freq: Once | INTRAMUSCULAR | Status: AC
  Administered 2022-07-28: 10 mg via INTRAMUSCULAR

## 2022-07-28 MED ORDER — ACETAMINOPHEN 500 MG PO TABS
1000.0000 mg | ORAL_TABLET | Freq: Once | ORAL | Status: AC
  Administered 2022-07-28: 1000 mg via ORAL

## 2022-07-28 NOTE — ED Provider Notes (Signed)
[facility]    CSN: [id number] Arrival date & time: 07/28/22  4098      History   Chief Complaint No chief complaint on file.   HPI Julia Choi is a 87 y.o. female.   Presenting today with severe left-sided leg pain extending from the lateral hip down toward the knee.  She was getting groceries out of her car when the pain started and thinks she may have pulled a muscle.  Denies weakness, numbness, tingling, back pain, bowel or bladder incontinence, saddle anesthesia, decreased range of motion.  Trying Tylenol and Biofreeze and heating pads with mild temporary relief of symptoms.    Past Medical History:  Diagnosis Date   Hypertension    Reflux    cholesterol, hyn    Patient Active Problem List   Diagnosis Date Noted   Type 2 diabetes mellitus (HCC) 01/19/2022   Insomnia 01/19/2022   Osteopenia 07/21/2021   Systolic murmur 07/21/2021   Gastroesophageal reflux disease 12/28/2020   Essential hypertension 12/15/2016   HLD (hyperlipidemia) 12/15/2016    Past Surgical History:  Procedure Laterality Date   INTRAMEDULLARY (IM) NAIL INTERTROCHANTERIC Right 12/16/2016   Procedure: INTRAMEDULLARY (IM) NAIL INTERTROCHANTRIC;  Surgeon: Vickki Hearing, MD;  Location: [facility];  Service: Orthopedics;  Laterality: Right;   JOINT REPLACEMENT      OB History   No obstetric history on file.      Home Medications    Prior to Admission medications   Medication Sig Start Date End Date Taking? Authorizing Provider  tizanidine (ZANAFLEX) 2 MG capsule Take 1 capsule (2 mg total) by mouth at bedtime as needed for muscle spasms. Do not drink alcohol or drive while taking this medication.  May cause drowsiness. 07/28/22  Yes Particia Nearing, PA-C  atorvastatin (LIPITOR) 10 MG tablet TAKE 1 TABLET BY MOUTH EVERY EVENING 05/03/22   Everlene Other G, DO  cetirizine (ZYRTEC) 10 MG tablet Take 10 mg by mouth daily as needed for allergies.    [provider]   famotidine (PEPCID) 20 MG tablet Take 20 mg by mouth 2 (two) times daily. 07/02/21   [provider]  fluorouracil (EFUDEX) 5 % cream fluorouracil 5 % topical cream  APPLY TO AFFECTED AREAS TWICE A DAY FOR 2 WEEKS THEN LEAVE OFF FOR 2 WEEKS AND REPEAT    [provider]  losartan-hydrochlorothiazide (HYZAAR) 100-25 MG tablet TAKE 1 TABLET BY MOUTH EVERY DAY 04/05/22   Everlene Other G, DO  Nebivolol HCl 20 MG TABS Take one tablet po every day 08/24/21   Tommie Sams, DO  pantoprazole (PROTONIX) 40 MG tablet TAKE 1 TABLET BY MOUTH EVERY DAY 07/18/22   Tommie Sams, DO    Family History History reviewed. No pertinent family history.  Social History Social History   Tobacco Use   Smoking status: Never   Smokeless tobacco: Never  Substance Use Topics   Alcohol use: No   Drug use: No     Allergies   Prednisone   Review of Systems Review of Systems PER HPI  Physical Exam Triage Vital Signs ED Triage Vitals  Enc Vitals Group     BP 07/28/22 0840 (!) 163/93     Pulse Rate 07/28/22 0840 81     Resp 07/28/22 0840 20     Temp 07/28/22 0840 99.1 F (37.3 C)     Temp Source 07/28/22 0840 Oral     SpO2 07/28/22 0840 98 %  Weight --      Height --      Head Circumference --      Peak Flow --      Pain Score 07/28/22 0844 5     Pain Loc --      Pain Edu? --      Excl. in [place]? --    No data found.  Updated Vital Signs BP (!) 163/93 (BP Location: Right Arm)   Pulse 81   Temp 99.1 F (37.3 C) (Oral)   Resp 20   SpO2 98%   Visual Acuity Right Eye Distance:   Left Eye Distance:   Bilateral Distance:    Right Eye Near:   Left Eye Near:    Bilateral Near:     Physical Exam Vitals and nursing note reviewed.  Constitutional:      Appearance: Normal appearance. She is not ill-appearing.  HENT:     Head: Atraumatic.  Eyes:     Extraocular Movements: Extraocular movements intact.     Conjunctiva/sclera: Conjunctivae normal.  Cardiovascular:     Rate  and Rhythm: Normal rate and regular rhythm.     Heart sounds: Normal heart sounds.  Pulmonary:     Effort: Pulmonary effort is normal.     Breath sounds: Normal breath sounds.  Musculoskeletal:        General: Tenderness present. Normal range of motion.     Cervical back: Normal range of motion and neck supple.     Comments: Tender to palpation down lateral quadriceps left leg.  Range of motion intact, strength full and equal bilateral legs, antalgic gait  Skin:    General: Skin is warm and dry.     Findings: No bruising or erythema.  Neurological:     Mental Status: She is alert and oriented to person, place, and time.     Comments: Left lower extremity neurovascularly intact  Psychiatric:        Mood and Affect: Mood normal.        Thought Content: Thought content normal.        Judgment: Judgment normal.      UC Treatments / Results  Labs (all labs ordered are listed, but only abnormal results are displayed) Labs Reviewed - No data to display  EKG   Radiology No results found.  Procedures Procedures (including critical care time)  Medications Ordered in UC Medications  dexamethasone (DECADRON) injection 10 mg (10 mg Intramuscular Given 07/28/22 0924)  acetaminophen (TYLENOL) tablet 1,000 mg (1,000 mg Oral Given 07/28/22 0923)    Initial Impression / Assessment and Plan / UC Course  I have reviewed the triage vital signs and the nursing notes.  Pertinent labs & imaging results that were available during my care of the patient were reviewed by me and considered in my medical decision making (see chart for details).     Treat with IM Decadron, Tylenol, Zanaflex at bedtime, support over-the-counter medications and home care.  Return for worsening symptoms.  Final Clinical Impressions(s) / UC Diagnoses   Final diagnoses:  Acute leg pain, left  Muscle strain of left hip, initial encounter   Discharge Instructions   None    ED Prescriptions     Medication Sig  Dispense Auth. Provider   tizanidine (ZANAFLEX) 2 MG capsule Take 1 capsule (2 mg total) by mouth at bedtime as needed for muscle spasms. Do not drink alcohol or drive while taking this medication.  May cause drowsiness. 10 capsule Volney American,  PA-C      PDMP not reviewed this encounter.   Volney American, [place] 07/28/22 1447

## 2022-07-28 NOTE — ED Triage Notes (Signed)
Pt reports she pulled something on her left side when she was walking x 5 days. Reports pain radiates from her hip down to her leg. Took tylenol ES, and used biofreeze and a heating pad which gave slight relief.

## 2022-07-30 ENCOUNTER — Other Ambulatory Visit: Payer: Self-pay | Admitting: Family Medicine

## 2022-07-30 ENCOUNTER — Ambulatory Visit (HOSPITAL_COMMUNITY)
Admission: RE | Admit: 2022-07-30 | Discharge: 2022-07-30 | Disposition: A | Discharge status: Home / Self Care | Payer: Medicare Other | Source: Ambulatory Visit | Attending: Family Medicine | Admitting: Family Medicine

## 2022-07-30 ENCOUNTER — Ambulatory Visit: Payer: Medicare Other | Admitting: Family Medicine

## 2022-07-30 VITALS — BP 138/72 | HR 75 | Temp 97.3°F | Ht 61.0 in | Wt 170.0 lb

## 2022-07-30 DIAGNOSIS — M25552 Pain in left hip: Secondary | ICD-10-CM | POA: Diagnosis present

## 2022-07-30 DIAGNOSIS — M898X5 Other specified disorders of bone, thigh: Secondary | ICD-10-CM

## 2022-07-30 MED ORDER — TRAMADOL HCL 50 MG PO TABS: 50.0000 mg | ORAL_TABLET | Freq: Three times a day (TID) | ORAL | 0 refills | Status: DC | PRN

## 2022-07-30 NOTE — Progress Notes (Signed)
Subjective:  Patient ID: Julia Choi, female    DOB: [birth year]  Age: 87 y.o. MRN: [id number]  CC: Chief Complaint  Patient presents with   lef leg pain     Started 1 week ago while walking and turned to the side, pain is located from left hip to left knee, went UC and was given steroid shot and muscle relaxer- didn't take due to possible drowsiness while on it     HPI:  87 year old female presents for evaluation of the above.  Patient recently seen by me on 1/8.  She states that on Saturday she went to the grocery store.  She turned/twisted and developed pain of her left hip extending down to the left knee.  She was seen at urgent care and was given an injection of dexamethasone and discharged home on Tylenol and Zanaflex.  Patient reports her pain continues to persist.  She has not taken a Zanaflex.  She has been using Tylenol and an over-the-counter equate pain reliever as well as Biofreeze and heating pad with improvement but no resolution.  She states that she finds it difficult to walk.  She denies any fall.  Patient Active Problem List   Diagnosis Date Noted   Left hip pain 07/30/2022   Type 2 diabetes mellitus ([place]) 01/19/2022   Insomnia 01/19/2022   Osteopenia 64/33/2951   Systolic murmur 88/41/6606   Gastroesophageal reflux disease 12/28/2020   Essential hypertension 12/15/2016   HLD (hyperlipidemia) 12/15/2016    Social Hx   Social History   Socioeconomic History   Marital status: Widowed    Spouse name: Not on file   Number of children: 1   Years of education: Not on file   Highest education level: Not on file  Occupational History   Occupation: retired  Tobacco Use   Smoking status: Never   Smokeless tobacco: Never  Substance and Sexual Activity   Alcohol use: No   Drug use: No   Sexual activity: Not Currently  Other Topics Concern   Not on file  Social History Narrative   Widowed since 2018   1 son, lives in [place]   2 grandchildren   1  great granddaughter.   Social Determinants of Health   Financial Resource Strain: Low Risk  (09/12/2021)   Overall Financial Resource Strain (CARDIA)    Difficulty of Paying Living Expenses: Not hard at all  Food Insecurity: No Food Insecurity (09/12/2021)   Hunger Vital Sign    Worried About Running Out of Food in the Last Year: Never true    Ran Out of Food in the Last Year: Never true  Transportation Needs: No Transportation Needs (09/12/2021)   PRAPARE - Hydrologist (Medical): No    Lack of Transportation (Non-Medical): No  Physical Activity: Sufficiently Active (09/12/2021)   Exercise Vital Sign    Days of Exercise per Week: 5 days    Minutes of Exercise per Session: 30 min  Stress: No Stress Concern Present (09/12/2021)   [place]    Feeling of Stress : Not at all  Social Connections: Moderately Integrated (09/12/2021)   Social Connection and Isolation Panel [NHANES]    Frequency of Communication with Friends and Family: More than three times a week    Frequency of Social Gatherings with Friends and Family: Once a week    Attends Religious Services: 1 to 4 times per year    Active  Member of Clubs or Organizations: No    Attends Archivist Meetings: 1 to 4 times per year    Marital Status: Widowed    Review of Systems Per HPI  Objective:  BP 138/72   Pulse 75   Temp (!) 97.3 F (36.3 C)   Ht 5\' 1"  (1.549 m)   Wt 170 lb (77.1 kg)   SpO2 97%   BMI 32.12 kg/m      07/30/2022   11:23 AM 07/28/2022    8:40 AM 07/23/2022   10:18 AM  BP/Weight  Systolic BP 009 233 007  Diastolic BP 72 93 88  Wt. (Lbs) 170  170.6  BMI 32.12 kg/m2  32.23 kg/m2    Physical Exam Vitals and nursing note reviewed.  Constitutional:      General: She is not in acute distress.    Appearance: She is obese.  HENT:     Head: Normocephalic and atraumatic.  Eyes:     General:         Right eye: No discharge.        Left eye: No discharge.     Conjunctiva/sclera: Conjunctivae normal.  Cardiovascular:     Rate and Rhythm: Normal rate and regular rhythm.  Pulmonary:     Effort: Pulmonary effort is normal.     Breath sounds: Normal breath sounds.  Musculoskeletal:     Comments: No tenderness of the lumbar spine.  No tenderness over the greater trochanter.  No appreciable discomfort in the left inguinal region.  Neurological:     Mental Status: She is alert.     Lab Results  Component Value Date   WBC 6.1 07/23/2022   HGB 12.1 07/23/2022   HCT 34.9 07/23/2022   PLT 245 07/23/2022   GLUCOSE 141 (H) 07/23/2022   CHOL 176 07/23/2022   TRIG 118 07/23/2022   HDL 65 07/23/2022   LDLCALC 90 07/23/2022   ALT 13 07/23/2022   AST 21 07/23/2022   NA 135 07/23/2022   K 4.1 07/23/2022   CL 93 (L) 07/23/2022   CREATININE 0.94 07/23/2022   BUN 13 07/23/2022   CO2 25 07/23/2022   TSH 1.53 06/04/2019   INR 1.15 12/15/2016   HGBA1C 6.5 (H) 07/23/2022     Assessment & Plan:   Problem List Items Addressed This Visit       Other   Left hip pain - Primary    History suggests muscle strain. However, given persistence of pain will x-ray for further evaluation.  Tramadol as needed for pain.      Relevant Orders   DG Hip Unilat W OR W/O Pelvis 2-3 Views Left (Completed)    Meds ordered this encounter  Medications   traMADol (ULTRAM) 50 MG tablet    Sig: Take 1 tablet (50 mg total) by mouth every 8 (eight) hours as needed for up to 5 days for moderate pain.    Dispense:  15 tablet    Refill:  0    Follow-up:  Return in about 1 month (around 08/30/2022).  [place]

## 2022-07-30 NOTE — Assessment & Plan Note (Signed)
History suggests muscle strain. However, given persistence of pain will x-ray for further evaluation.  Tramadol as needed for pain.

## 2022-07-30 NOTE — Addendum Note (Signed)
Addended by: Dairl Ponder on: 07/30/2022 11:23 AM   Modules accepted: Orders

## 2022-07-30 NOTE — Patient Instructions (Signed)
Xray at the hospital.  Medication as directed.  We will call with the results.

## 2022-08-01 ENCOUNTER — Ambulatory Visit: Payer: Medicare Other | Admitting: Family Medicine

## 2022-08-01 DIAGNOSIS — M25552 Pain in left hip: Secondary | ICD-10-CM | POA: Diagnosis not present

## 2022-08-01 DIAGNOSIS — M898X5 Other specified disorders of bone, thigh: Secondary | ICD-10-CM

## 2022-08-01 MED ORDER — HYDROCODONE-ACETAMINOPHEN 5-325 MG PO TABS: 1.0000 | ORAL_TABLET | Freq: Four times a day (QID) | ORAL | 0 refills | Status: DC | PRN

## 2022-08-01 NOTE — Progress Notes (Signed)
Subjective:  Patient ID: Julia Choi, female    DOB: 12/28/1935  Age: 87 y.o. MRN: [id number]  CC: Chief Complaint  Patient presents with   discuss labs   pain in left thigh    9/10 states taking tramadol and advil in between 9/10 pain level    HPI:  87 year old female presents to discuss recent findings.  Patient has had recent left hip pain/pain in the left lower extremity.  X-ray was obtained on 1/15.  X-ray revealed a lytic lucency of the left ilium.  She presents today to discuss the findings.  I have advised the patient that she needs further evaluation to assess for possible multiple myeloma.  I have recommended additional labs as well as referral to hematology/oncology.  Patient reports continued pain.  She states that her pain is 9/10 in severity even with use of tramadol.  Patient Active Problem List   Diagnosis Date Noted   Left hip pain 07/30/2022   Type 2 diabetes mellitus ([place]) 01/19/2022   Insomnia 01/19/2022   Osteopenia 97/35/3299   Systolic murmur 24/26/8341   Gastroesophageal reflux disease 12/28/2020   Essential hypertension 12/15/2016   HLD (hyperlipidemia) 12/15/2016    Social Hx   Social History   Socioeconomic History   Marital status: Widowed    Spouse name: Not on file   Number of children: 1   Years of education: Not on file   Highest education level: Not on file  Occupational History   Occupation: retired  Tobacco Use   Smoking status: Never   Smokeless tobacco: Never  Substance and Sexual Activity   Alcohol use: No   Drug use: No   Sexual activity: Not Currently  Other Topics Concern   Not on file  Social History Narrative   Widowed since 2018   1 son, lives in [place]   2 grandchildren   1 great granddaughter.   Social Determinants of Health   Financial Resource Strain: Low Risk  (09/12/2021)   Overall Financial Resource Strain (CARDIA)    Difficulty of Paying Living Expenses: Not hard at all  Food Insecurity: No Food  Insecurity (09/12/2021)   Hunger Vital Sign    Worried About Running Out of Food in the Last Year: Never true    Ran Out of Food in the Last Year: Never true  Transportation Needs: No Transportation Needs (09/12/2021)   PRAPARE - Hydrologist (Medical): No    Lack of Transportation (Non-Medical): No  Physical Activity: Sufficiently Active (09/12/2021)   Exercise Vital Sign    Days of Exercise per Week: 5 days    Minutes of Exercise per Session: 30 min  Stress: No Stress Concern Present (09/12/2021)   [place]    Feeling of Stress : Not at all  Social Connections: Moderately Integrated (09/12/2021)   Social Connection and Isolation Panel [NHANES]    Frequency of Communication with Friends and Family: More than three times a week    Frequency of Social Gatherings with Friends and Family: Once a week    Attends Religious Services: 1 to 4 times per year    Active Member of [employer] or Organizations: No    Attends Archivist Meetings: 1 to 4 times per year    Marital Status: Widowed    Review of Systems Per HPI  Objective:  BP 135/78   Pulse 81   Temp (!) 97 F (36.1 C)  Wt 163 lb (73.9 kg)   SpO2 98%   BMI 30.80 kg/m      08/01/2022   11:08 AM 07/30/2022   11:23 AM 07/28/2022    8:40 AM  BP/Weight  Systolic BP 270 786 754  Diastolic BP 78 72 93  Wt. (Lbs) 163 170   BMI 30.8 kg/m2 32.12 kg/m2     Physical Exam Vitals and nursing note reviewed.  Constitutional:      Appearance: Normal appearance. She is obese.  HENT:     Head: Normocephalic and atraumatic.  Pulmonary:     Effort: Pulmonary effort is normal. No respiratory distress.  Neurological:     Mental Status: She is alert.     Lab Results  Component Value Date   WBC 6.1 07/23/2022   HGB 12.1 07/23/2022   HCT 34.9 07/23/2022   PLT 245 07/23/2022   GLUCOSE 141 (H) 07/23/2022   CHOL 176 07/23/2022   TRIG  118 07/23/2022   HDL 65 07/23/2022   LDLCALC 90 07/23/2022   ALT 13 07/23/2022   AST 21 07/23/2022   NA 135 07/23/2022   K 4.1 07/23/2022   CL 93 (L) 07/23/2022   CREATININE 0.94 07/23/2022   BUN 13 07/23/2022   CO2 25 07/23/2022   TSH 1.53 06/04/2019   INR 1.15 12/15/2016   HGBA1C 6.5 (H) 07/23/2022     Assessment & Plan:   Problem List Items Addressed This Visit       Other   Left hip pain    Patient has a lytic bone lesion.  Referring to hematology/oncology.  Labs for further evaluation today.  Pain is uncontrolled.  Stopping tramadol.  Starting hydrocodone.      Other Visit Diagnoses     Lytic bone lesion of hip       Relevant Orders   Ambulatory referral to Hematology / Oncology   UPEP/UIFE/Light Chains/TP, 24-Hr Ur       Meds ordered this encounter  Medications   HYDROcodone-acetaminophen (NORCO/VICODIN) 5-325 MG tablet    Sig: Take 1 tablet by mouth every 6 (six) hours as needed for moderate pain or severe pain.    Dispense:  20 tablet    Refill:  0    Follow-up:  Pending work up.  [place]

## 2022-08-01 NOTE — Assessment & Plan Note (Signed)
Patient has a lytic bone lesion.  Referring to hematology/oncology.  Labs for further evaluation today.  Pain is uncontrolled.  Stopping tramadol.  Starting hydrocodone.

## 2022-08-01 NOTE — Patient Instructions (Addendum)
Labs today.  I have placed the referral.  Stop tramadol. Start new medication for pain.  We will call with results.

## 2022-08-06 LAB — PE AND FLC, SERUM
A/G Ratio: 1.2 (ref 0.7–1.7)
Albumin ELP: 4 g/dL (ref 2.9–4.4)
Alpha 1: 0.2 g/dL (ref 0.0–0.4)
Alpha 2: 1.1 g/dL — ABNORMAL HIGH (ref 0.4–1.0)
Beta: 0.9 g/dL (ref 0.7–1.3)
Gamma Globulin: 1 g/dL (ref 0.4–1.8)
Globulin, Total: 3.3 g/dL (ref 2.2–3.9)
Ig Kappa Free Light Chain: 14.1 mg/L (ref 3.3–19.4)
Ig Lambda Free Light Chain: 11.9 mg/L (ref 5.7–26.3)
KAPPA/LAMBDA RATIO: 1.18 (ref 0.26–1.65)
Total Protein: 7.3 g/dL (ref 6.0–8.5)

## 2022-08-06 LAB — PTH, INTACT AND CALCIUM
Calcium: 9.7 mg/dL (ref 8.7–10.3)
PTH: 30 pg/mL (ref 15–65)

## 2022-08-06 LAB — IMMUNOFIXATION, SERUM
IgA/Immunoglobulin A, Serum: 156 mg/dL (ref 64–422)
IgG (Immunoglobin G), Serum: 957 mg/dL (ref 586–1602)
IgM (Immunoglobulin M), Srm: 56 mg/dL (ref 26–217)

## 2022-08-07 ENCOUNTER — Telehealth: Payer: Self-pay | Admitting: *Deleted

## 2022-08-07 LAB — UPEP/UIFE/LIGHT CHAINS/TP, 24-HR UR
% BETA, Urine: 0 %
ALBUMIN, U: 100 %
ALPHA 1 URINE: 0 %
ALPHA-2-GLOBULIN, U: 0 %
Free Kappa Lt Chains,Ur: 3.01 mg/L (ref 1.17–86.46)
Free Lambda Lt Chains,Ur: 0.81 mg/L (ref 0.27–15.21)
GAMMA GLOBULIN URINE: 0 %
Kappa/Lambda Ratio,U: 3.72 (ref 1.83–14.26)
Protein, 24H Urine: 40 mg/24 hr (ref 30–150)
Protein, Ur: 8.4 mg/dL

## 2022-08-07 NOTE — Telephone Encounter (Signed)
Family member called and left message on machine that patient is a "complete nervous wreck" and confined to her chair currently- seeing Oncology on Thursday but was wondering if you could prescribe something to help with her nerves till then. Family stated they are staying with the patient 24/7 currently so she is safe

## 2022-08-09 ENCOUNTER — Encounter: Payer: Self-pay | Admitting: *Deleted

## 2022-08-09 ENCOUNTER — Inpatient Hospital Stay (HOSPITAL_COMMUNITY)
Admission: EM | Admit: 2022-08-09 | Discharge: 2022-08-13 | DRG: 640 | Disposition: A | Discharge status: Skilled Nursing Facility | Payer: Medicare Other | Attending: Internal Medicine | Admitting: Internal Medicine

## 2022-08-09 ENCOUNTER — Emergency Department (HOSPITAL_COMMUNITY): Payer: Medicare Other

## 2022-08-09 ENCOUNTER — Inpatient Hospital Stay: Payer: Medicare Other

## 2022-08-09 ENCOUNTER — Other Ambulatory Visit: Payer: Self-pay

## 2022-08-09 ENCOUNTER — Inpatient Hospital Stay: Payer: Medicare Other | Attending: Hematology | Admitting: Hematology

## 2022-08-09 ENCOUNTER — Encounter (HOSPITAL_COMMUNITY): Payer: Self-pay | Admitting: *Deleted

## 2022-08-09 ENCOUNTER — Ambulatory Visit (HOSPITAL_COMMUNITY)
Admission: RE | Admit: 2022-08-09 | Discharge: 2022-08-09 | Disposition: A | Discharge status: Home / Self Care | Payer: Medicare Other | Source: Ambulatory Visit | Attending: Hematology | Admitting: Hematology

## 2022-08-09 DIAGNOSIS — M898X5 Other specified disorders of bone, thigh: Secondary | ICD-10-CM | POA: Diagnosis present

## 2022-08-09 DIAGNOSIS — R339 Retention of urine, unspecified: Secondary | ICD-10-CM | POA: Diagnosis present

## 2022-08-09 DIAGNOSIS — Z1152 Encounter for screening for COVID-19: Secondary | ICD-10-CM | POA: Diagnosis not present

## 2022-08-09 DIAGNOSIS — M85852 Other specified disorders of bone density and structure, left thigh: Secondary | ICD-10-CM | POA: Diagnosis present

## 2022-08-09 DIAGNOSIS — Z66 Do not resuscitate: Secondary | ICD-10-CM | POA: Diagnosis present

## 2022-08-09 DIAGNOSIS — K219 Gastro-esophageal reflux disease without esophagitis: Secondary | ICD-10-CM | POA: Diagnosis present

## 2022-08-09 DIAGNOSIS — M899 Disorder of bone, unspecified: Secondary | ICD-10-CM

## 2022-08-09 DIAGNOSIS — M25552 Pain in left hip: Secondary | ICD-10-CM

## 2022-08-09 DIAGNOSIS — D649 Anemia, unspecified: Secondary | ICD-10-CM | POA: Diagnosis present

## 2022-08-09 DIAGNOSIS — K5900 Constipation, unspecified: Secondary | ICD-10-CM | POA: Diagnosis present

## 2022-08-09 DIAGNOSIS — G9341 Metabolic encephalopathy: Secondary | ICD-10-CM | POA: Diagnosis present

## 2022-08-09 DIAGNOSIS — E871 Hypo-osmolality and hyponatremia: Secondary | ICD-10-CM | POA: Diagnosis present

## 2022-08-09 DIAGNOSIS — E782 Mixed hyperlipidemia: Secondary | ICD-10-CM | POA: Diagnosis present

## 2022-08-09 DIAGNOSIS — E669 Obesity, unspecified: Secondary | ICD-10-CM | POA: Insufficient documentation

## 2022-08-09 DIAGNOSIS — E876 Hypokalemia: Secondary | ICD-10-CM | POA: Diagnosis present

## 2022-08-09 DIAGNOSIS — Z6830 Body mass index (BMI) 30.0-30.9, adult: Secondary | ICD-10-CM | POA: Diagnosis not present

## 2022-08-09 DIAGNOSIS — I10 Essential (primary) hypertension: Secondary | ICD-10-CM | POA: Diagnosis present

## 2022-08-09 DIAGNOSIS — Z79899 Other long term (current) drug therapy: Secondary | ICD-10-CM

## 2022-08-09 DIAGNOSIS — Z888 Allergy status to other drugs, medicaments and biological substances status: Secondary | ICD-10-CM

## 2022-08-09 DIAGNOSIS — R937 Abnormal findings on diagnostic imaging of other parts of musculoskeletal system: Secondary | ICD-10-CM

## 2022-08-09 DIAGNOSIS — M8580 Other specified disorders of bone density and structure, unspecified site: Secondary | ICD-10-CM | POA: Diagnosis present

## 2022-08-09 DIAGNOSIS — E119 Type 2 diabetes mellitus without complications: Secondary | ICD-10-CM | POA: Diagnosis present

## 2022-08-09 DIAGNOSIS — M898X9 Other specified disorders of bone, unspecified site: Secondary | ICD-10-CM

## 2022-08-09 LAB — CBC WITH DIFFERENTIAL/PLATELET
Abs Immature Granulocytes: 0.04 10*3/uL (ref 0.00–0.07)
Abs Immature Granulocytes: 0.04 10*3/uL (ref 0.00–0.07)
Basophils Absolute: 0 10*3/uL (ref 0.0–0.1)
Basophils Absolute: 0 10*3/uL (ref 0.0–0.1)
Basophils Relative: 0 %
Basophils Relative: 0 %
Eosinophils Absolute: 0 10*3/uL (ref 0.0–0.5)
Eosinophils Absolute: 0 10*3/uL (ref 0.0–0.5)
Eosinophils Relative: 0 %
Eosinophils Relative: 0 %
HCT: 35.3 % — ABNORMAL LOW (ref 36.0–46.0)
HCT: 34.8 % — ABNORMAL LOW (ref 36.0–46.0)
Hemoglobin: 12.9 g/dL (ref 12.0–15.0)
Hemoglobin: 12.7 g/dL (ref 12.0–15.0)
Immature Granulocytes: 1 %
Immature Granulocytes: 1 %
Lymphocytes Relative: 19 %
Lymphocytes Relative: 24 %
Lymphs Abs: 1.4 10*3/uL (ref 0.7–4.0)
Lymphs Abs: 1.6 10*3/uL (ref 0.7–4.0)
MCH: 32.5 pg (ref 26.0–34.0)
MCH: 32.6 pg (ref 26.0–34.0)
MCHC: 36.5 g/dL — ABNORMAL HIGH (ref 30.0–36.0)
MCHC: 36.5 g/dL — ABNORMAL HIGH (ref 30.0–36.0)
MCV: 88.9 fL (ref 80.0–100.0)
MCV: 89.5 fL (ref 80.0–100.0)
Monocytes Absolute: 0.6 10*3/uL (ref 0.1–1.0)
Monocytes Absolute: 0.5 10*3/uL (ref 0.1–1.0)
Monocytes Relative: 8 %
Monocytes Relative: 7 %
Neutro Abs: 5.3 10*3/uL (ref 1.7–7.7)
Neutro Abs: 4.5 10*3/uL (ref 1.7–7.7)
Neutrophils Relative %: 72 %
Neutrophils Relative %: 68 %
Platelets: 302 10*3/uL (ref 150–400)
Platelets: 260 10*3/uL (ref 150–400)
RBC: 3.97 MIL/uL (ref 3.87–5.11)
RBC: 3.89 MIL/uL (ref 3.87–5.11)
RDW: 12.2 % (ref 11.5–15.5)
RDW: 12.2 % (ref 11.5–15.5)
WBC: 7.3 10*3/uL (ref 4.0–10.5)
WBC: 6.7 10*3/uL (ref 4.0–10.5)
nRBC: 0 % (ref 0.0–0.2)
nRBC: 0 % (ref 0.0–0.2)

## 2022-08-09 LAB — COMPREHENSIVE METABOLIC PANEL
ALT: 26 U/L (ref 0–44)
ALT: 25 U/L (ref 0–44)
AST: 31 U/L (ref 15–41)
AST: 27 U/L (ref 15–41)
Albumin: 4.5 g/dL (ref 3.5–5.0)
Albumin: 4.5 g/dL (ref 3.5–5.0)
Alkaline Phosphatase: 45 U/L (ref 38–126)
Alkaline Phosphatase: 42 U/L (ref 38–126)
Anion gap: 14 (ref 5–15)
Anion gap: 13 (ref 5–15)
BUN: 21 mg/dL (ref 8–23)
BUN: 20 mg/dL (ref 8–23)
CO2: 22 mmol/L (ref 22–32)
CO2: 24 mmol/L (ref 22–32)
Calcium: 9.4 mg/dL (ref 8.9–10.3)
Calcium: 9.3 mg/dL (ref 8.9–10.3)
Chloride: 82 mmol/L — ABNORMAL LOW (ref 98–111)
Chloride: 81 mmol/L — ABNORMAL LOW (ref 98–111)
Creatinine, Ser: 0.99 mg/dL (ref 0.44–1.00)
Creatinine, Ser: 0.94 mg/dL (ref 0.44–1.00)
GFR, Estimated: 56 mL/min — ABNORMAL LOW (ref >60)
GFR, Estimated: 60 mL/min — ABNORMAL LOW (ref >60)
Glucose, Bld: 138 mg/dL — ABNORMAL HIGH (ref 70–99)
Glucose, Bld: 144 mg/dL — ABNORMAL HIGH (ref 70–99)
Potassium: 3.7 mmol/L (ref 3.5–5.1)
Potassium: 3.1 mmol/L — ABNORMAL LOW (ref 3.5–5.1)
Sodium: 118 mmol/L — CL (ref 135–145)
Sodium: 118 mmol/L — CL (ref 135–145)
Total Bilirubin: 1.3 mg/dL — ABNORMAL HIGH (ref 0.3–1.2)
Total Bilirubin: 1.5 mg/dL — ABNORMAL HIGH (ref 0.3–1.2)
Total Protein: 7.9 g/dL (ref 6.5–8.1)
Total Protein: 7.7 g/dL (ref 6.5–8.1)

## 2022-08-09 LAB — LACTATE DEHYDROGENASE: LDH: 122 U/L (ref 98–192)

## 2022-08-09 MED ORDER — SODIUM CHLORIDE 0.9 % IV BOLUS
500.0000 mL | Freq: Once | INTRAVENOUS | Status: AC
  Administered 2022-08-09: 500 mL via INTRAVENOUS

## 2022-08-09 MED ORDER — IOHEXOL 300 MG/ML  SOLN
100.0000 mL | Freq: Once | INTRAMUSCULAR | Status: AC | PRN
  Administered 2022-08-09: 100 mL via INTRAVENOUS

## 2022-08-09 MED ORDER — POTASSIUM CHLORIDE CRYS ER 20 MEQ PO TBCR
20.0000 meq | EXTENDED_RELEASE_TABLET | Freq: Once | ORAL | Status: AC
  Administered 2022-08-09: 20 meq via ORAL
  Filled 2022-08-09: qty 1

## 2022-08-09 MED ORDER — TRAMADOL HCL 50 MG PO TABS: 50.0000 mg | ORAL_TABLET | Freq: Two times a day (BID) | ORAL | 0 refills | Status: DC | PRN

## 2022-08-09 NOTE — ED Triage Notes (Signed)
Pt sent here for critical NA level.  Family member states pt has been confused.

## 2022-08-09 NOTE — ED Provider Notes (Signed)
[place] EMERGENCY DEPARTMENT AT [facility] Provider Note   CSN: [id number] Arrival date & time: 08/09/22  1732     History  Chief Complaint  Patient presents with   Abnormal labs    Julia Choi is a 87 y.o. female.  She has been having a month of left hip and thigh pain.  They ended up finding a lytic lesion and she saw Dr. Ellin Saba today.  He felt her lab work did not look like multiple myeloma but set up other labs for further workup.  Patient received a call not too long after aware that her sodium was critically low and she needed to come here for evaluation.  Daughter states she has got ongoing pain of her hip and thigh, difficulty ambulating.  She also has not been eating well has no appetite.  Daughter also feels she has been more confused and kind of spacey.  Patient denies any chest pain cough headache numbness weakness abdominal pain or urinary symptoms.  She was constipated having taken hydrocodone and then took a laxative and had diarrhea for few days.  Bowels are starting to get back to normal.  Was on tramadol for pain not currently using Tylenol and Advil  The history is provided by the patient and a relative.  Altered Mental Status Presenting symptoms: confusion   Most recent episode:  More than 2 days ago Timing:  Intermittent Progression:  Unchanged Chronicity:  New Associated symptoms: no abdominal pain, no bladder incontinence, no difficulty breathing, no fever, no headaches, no nausea, no rash and no vomiting        Home Medications Prior to Admission medications   Medication Sig Start Date End Date Taking? Authorizing Provider  atorvastatin (LIPITOR) 10 MG tablet TAKE 1 TABLET BY MOUTH EVERY EVENING 05/03/22   Cook, [place] G, DO  cetirizine (ZYRTEC) 10 MG tablet Take 10 mg by mouth daily as needed for allergies.    [provider]  famotidine (PEPCID) 20 MG tablet Take 20 mg by mouth 2 (two) times daily. 07/02/21   [provider]  fluorouracil (EFUDEX) 5 % cream fluorouracil 5 % topical cream  APPLY TO AFFECTED AREAS TWICE A DAY FOR 2 WEEKS THEN LEAVE OFF FOR 2 WEEKS AND REPEAT    [provider]  losartan-hydrochlorothiazide (HYZAAR) 100-25 MG tablet TAKE 1 TABLET BY MOUTH EVERY DAY 04/05/22   Everlene Other G, DO  Nebivolol HCl 20 MG TABS Take one tablet po every day 08/24/21   Tommie Sams, DO  pantoprazole (PROTONIX) 40 MG tablet TAKE 1 TABLET BY MOUTH EVERY DAY 07/18/22   Tommie Sams, DO  traMADol (ULTRAM) 50 MG tablet Take 1 tablet (50 mg total) by mouth every 12 (twelve) hours as needed for moderate pain. 08/09/22   Doreatha Massed, MD      Allergies    Prednisone    Review of Systems   Review of Systems  Constitutional:  Positive for appetite change. Negative for fever.  Respiratory:  Negative for shortness of breath.   Cardiovascular:  Negative for chest pain.  Gastrointestinal:  Positive for constipation and diarrhea. Negative for abdominal pain, nausea and vomiting.  Genitourinary:  Negative for bladder incontinence and dysuria.  Musculoskeletal:  Positive for gait problem.  Skin:  Negative for rash.  Neurological:  Negative for headaches.  Psychiatric/Behavioral:  Positive for confusion.     Physical Exam Updated Vital Signs BP (!) 144/68 (BP Location: Right Arm)   Pulse Marland Kitchen)  59   Temp 97.8 F (36.6 C) (Oral)   Ht 5\' 1"  (1.549 m)   Wt 73.9 kg   SpO2 100%   BMI 30.80 kg/m  Physical Exam Vitals and nursing note reviewed.  Constitutional:      General: She is not in acute distress.    Appearance: Normal appearance. She is well-developed.  HENT:     Head: Normocephalic and atraumatic.  Eyes:     Conjunctiva/sclera: Conjunctivae normal.  Cardiovascular:     Rate and Rhythm: Normal rate and regular rhythm.     Heart sounds: No murmur heard. Pulmonary:     Effort: Pulmonary effort is normal. No respiratory distress.     Breath sounds: Normal breath sounds.   Abdominal:     Palpations: Abdomen is soft.     Tenderness: There is no abdominal tenderness. There is no guarding or rebound.  Musculoskeletal:     Cervical back: Neck supple.     Right lower leg: No edema.     Left lower leg: No edema.  Skin:    General: Skin is warm and dry.     Capillary Refill: Capillary refill takes less than 2 seconds.  Neurological:     General: No focal deficit present.     Mental Status: She is alert.     Cranial Nerves: No cranial nerve deficit.     Sensory: No sensory deficit.     Motor: No weakness.     ED Results / Procedures / Treatments   Labs (all labs ordered are listed, but only abnormal results are displayed) Labs Reviewed  COMPREHENSIVE METABOLIC PANEL - Abnormal; Notable for the following components:      Result Value   Sodium 118 (*)    Potassium 3.1 (*)    Chloride 81 (*)    Glucose, Bld 144 (*)    Total Bilirubin 1.5 (*)    GFR, Estimated 60 (*)    All other components within normal limits  CBC WITH DIFFERENTIAL/PLATELET - Abnormal; Notable for the following components:   HCT 34.8 (*)    MCHC 36.5 (*)    All other components within normal limits  URINALYSIS, ROUTINE W REFLEX MICROSCOPIC - Abnormal; Notable for the following components:   Leukocytes,Ua SMALL (*)    All other components within normal limits  OSMOLALITY - Abnormal; Notable for the following components:   Osmolality 251 (*)    All other components within normal limits  COMPREHENSIVE METABOLIC PANEL - Abnormal; Notable for the following components:   Sodium 120 (*)    Potassium 2.8 (*)    Chloride 87 (*)    Glucose, Bld 104 (*)    Alkaline Phosphatase 37 (*)    All other components within normal limits  CBC - Abnormal; Notable for the following components:   RBC 3.79 (*)    HCT 34.2 (*)    All other components within normal limits  MRSA NEXT GEN BY PCR, NASAL  SODIUM, URINE, RANDOM  CREATININE, URINE, RANDOM  MAGNESIUM  PHOSPHORUS  OSMOLALITY, URINE   BASIC METABOLIC PANEL  BASIC METABOLIC PANEL    EKG None  Radiology CT Hip Left Wo Contrast  Result Date: 08/09/2022 CLINICAL DATA:  Lytic lesion. EXAM: CT OF THE LEFT HIP WITHOUT CONTRAST TECHNIQUE: Multidetector CT imaging of the left hip was performed according to the standard protocol. Multiplanar CT image reconstructions were also generated. RADIATION DOSE REDUCTION: This exam was performed according to the departmental dose-optimization program which includes automated exposure  control, adjustment of the mA and/or kV according to patient size and/or use of iterative reconstruction technique. COMPARISON:  X-ray 07/30/2022. FINDINGS: Bones/Joint/Cartilage Mild concentric joint space loss of the left hip. No fracture or dislocation. There is also mild degenerative changes along the left sacroiliac joint and hypertrophic changes along the pubic symphysis. No definite lytic bone lesions identified by CT. Preserved contours of the urinary bladder. The uterus is present. The visualized bowel in the pelvis is nondilated. No developing lymph node enlargement. Scattered vascular calcifications. IMPRESSION: Degenerative changes. No specific lytic destructive process involving the iliac bone. If there is further concern a bone scan or MRI could be considered. Please see separate dictation of the standard abdomen and pelvis CT with contrast from same day Electronically Signed   By: Karen Kays M.D.   On: 08/09/2022 20:32   CT Head Wo Contrast  Result Date: 08/09/2022 CLINICAL DATA:  Mental status change, unknown cause EXAM: CT HEAD WITHOUT CONTRAST TECHNIQUE: Contiguous axial images were obtained from the base of the skull through the vertex without intravenous contrast. RADIATION DOSE REDUCTION: This exam was performed according to the departmental dose-optimization program which includes automated exposure control, adjustment of the mA and/or kV according to patient size and/or use of iterative  reconstruction technique. COMPARISON:  None Available. FINDINGS: Brain: Cerebral ventricle sizes are concordant with the degree of cerebral volume loss. Patchy and confluent areas of decreased attenuation are noted throughout the deep and periventricular white matter of the cerebral hemispheres bilaterally, compatible with chronic microvascular ischemic disease. No evidence of large-territorial acute infarction. No parenchymal hemorrhage. No mass lesion. No extra-axial collection. No mass effect or midline shift. No hydrocephalus. Basilar cisterns are patent. Vascular: No hyperdense vessel. Atherosclerotic calcifications are present within the cavernous internal carotid and vertebral arteries. Skull: No acute fracture or focal lesion. Sinuses/Orbits: Sphenoid sinus mucosal thickening. Paranasal sinuses and mastoid air cells are clear. Bilateral lens replacement. Otherwise the orbits are unremarkable. Other: None. IMPRESSION: No acute intracranial abnormality. Electronically Signed   By: Tish Frederickson M.D.   On: 08/09/2022 20:26   CT Abdomen Pelvis W Contrast  Result Date: 08/09/2022 CLINICAL DATA:  Metastatic disease evaluation for iliac lesion. EXAM: CT ABDOMEN AND PELVIS WITH CONTRAST TECHNIQUE: Multidetector CT imaging of the abdomen and pelvis was performed using the standard protocol following bolus administration of intravenous contrast. RADIATION DOSE REDUCTION: This exam was performed according to the departmental dose-optimization program which includes automated exposure control, adjustment of the mA and/or kV according to patient size and/or use of iterative reconstruction technique. CONTRAST:  OMNIPAQUE IOHEXOL 300 MG/ML  SOLN COMPARISON:  Left femur x-ray 08/09/2022 FINDINGS: Lower chest: No acute abnormality. Hepatobiliary: No focal liver abnormality is seen. No gallstones, gallbladder wall thickening, or biliary dilatation. Pancreas: Unremarkable. No pancreatic ductal dilatation or  surrounding inflammatory changes. Spleen: Normal in size without focal abnormality. Adrenals/Urinary Tract: There is a 2 cm right renal cyst and a 3.5 cm left renal cyst. There is no hydronephrosis or perinephric fat stranding. The adrenal glands and bladder are within normal limits. Stomach/Bowel: Stomach is within normal limits. Appendix appears normal. No evidence of bowel wall thickening, distention, or inflammatory changes. Vascular/Lymphatic: Aortic atherosclerosis. No enlarged abdominal or pelvic lymph nodes. Reproductive: Uterus and bilateral adnexa are unremarkable. Other: No abdominal wall hernia or abnormality. No abdominopelvic ascites. Musculoskeletal: Right-sided hip screw present. No acute fracture or focal osseous lesion identified. Degenerative changes affect spine. IMPRESSION: 1. No acute localizing process in the abdomen or pelvis.  2. Bilateral renal cysts. No follow-up needed. Aortic Atherosclerosis (ICD10-I70.0). Electronically Signed   By: Ronney Asters M.D.   On: 08/09/2022 20:26   DG Chest Port 1 View  Result Date: 08/09/2022 CLINICAL DATA:  Altered mental status EXAM: PORTABLE CHEST 1 VIEW COMPARISON:  12/15/2016 x-ray FINDINGS: No consolidation, pneumothorax or effusion. Normal cardiopericardial silhouette with tortuous aorta. Overlapping cardiac leads. IMPRESSION: No acute cardiopulmonary disease Electronically Signed   By: Jill Side M.D.   On: 08/09/2022 18:16   DG FEMUR MIN 2 VIEWS LEFT  Result Date: 08/09/2022 CLINICAL DATA:  Left femur pain. EXAM: LEFT FEMUR 2 VIEWS COMPARISON:  July 30, 2022. FINDINGS: There is no evidence of fracture or other focal bone lesions. Lucency seen in left ilium on prior radiograph is not well visualized currently, and potentially may have represented overlying bowel gas. Soft tissues are unremarkable. IMPRESSION: Negative. Electronically Signed   By: Marijo Conception M.D.   On: 08/09/2022 14:55    Procedures .Critical Care  Performed by:  Hayden Rasmussen, MD Authorized by: Hayden Rasmussen, MD   Critical care provider statement:    Critical care time (minutes):  45   Critical care time was exclusive of:  Separately billable procedures and treating other patients   Critical care was necessary to treat or prevent imminent or life-threatening deterioration of the following conditions:  Metabolic crisis   Critical care was time spent personally by me on the following activities:  Development of treatment plan with patient or surrogate, discussions with consultants, evaluation of patient's response to treatment, examination of patient, obtaining history from patient or surrogate, ordering and performing treatments and interventions, ordering and review of laboratory studies, ordering and review of radiographic studies, pulse oximetry, re-evaluation of patient's condition and review of old charts   I assumed direction of critical care for this patient from another provider in my specialty: no       Medications Ordered in ED Medications  atorvastatin (LIPITOR) tablet 10 mg (10 mg Oral Given 08/10/22 0835)  nebivolol (BYSTOLIC) tablet 20 mg (20 mg Oral Given 08/10/22 0834)  pantoprazole (PROTONIX) EC tablet 40 mg (40 mg Oral Given 08/10/22 0835)  calcium carbonate (OS-CAL - dosed in mg of elemental calcium) tablet 1,250 mg (1,250 mg Oral Given 08/10/22 0834)  cholecalciferol (VITAMIN D3) 25 MCG (1000 UNIT) tablet 1,000 Units (1,000 Units Oral Given 08/10/22 0835)  multivitamin with minerals tablet 1 tablet (1 tablet Oral Given 08/10/22 0836)  enoxaparin (LOVENOX) injection 40 mg (40 mg Subcutaneous Given 08/10/22 0837)  acetaminophen (TYLENOL) tablet 650 mg (650 mg Oral Given 08/10/22 0233)    Or  acetaminophen (TYLENOL) suppository 650 mg ( Rectal See Alternative 08/10/22 0233)  ondansetron (ZOFRAN) tablet 4 mg (has no administration in time range)    Or  ondansetron (ZOFRAN) injection 4 mg (has no administration in time range)  traMADol  (ULTRAM) tablet 50 mg (50 mg Oral Given 08/10/22 0046)  Chlorhexidine Gluconate Cloth 2 % PADS 6 each (0 each Topical Duplicate 10/23/79 1914)  0.9 %  sodium chloride infusion ( Intravenous Infusion Verify 08/10/22 0508)  sodium chloride 0.9 % bolus 500 mL (0 mLs Intravenous Stopped 08/09/22 2203)  iohexol (OMNIPAQUE) 300 MG/ML solution 100 mL (100 mLs Intravenous Contrast Given 08/09/22 1943)  potassium chloride SA (KLOR-CON M) CR tablet 20 mEq (20 mEq Oral Given 08/09/22 2210)  potassium chloride SA (KLOR-CON M) CR tablet 20 mEq (20 mEq Oral Given 08/09/22 2310)  potassium chloride SA (KLOR-CON M)  CR tablet 40 mEq (40 mEq Oral Given 08/10/22 0086)    ED Course/ Medical Decision Making/ A&P Clinical Course as of 08/10/22 0943  Thu Aug 09, 2022  2110 Discussed with Dr. Josephine Cables [facility] hospitalist who will evaluate patient for admission.  Her imaging does not show any obvious signs of malignancy. [MB]    Clinical Course User Index [MB] Hayden Rasmussen, MD                             Medical Decision Making Amount and/or Complexity of Data Reviewed Labs: ordered. Radiology: ordered.  Risk Prescription drug management. Decision regarding hospitalization.   This patient complains of low sodium left hip pain confusion; this involves an extensive number of treatment Options and is a complaint that carries with it a high risk of complications and morbidity. The differential includes metabolic derangement, dehydration, SIADH, tumor, renal failure, seizure  I ordered, reviewed and interpreted labs, which included CBC with normal white count, hemoglobin fairly normal, chemistries with critically low sodium low potassium, urinalysis without signs of infection I ordered medication IV fluids and reviewed PMP when indicated. I ordered imaging studies which included chest x-ray, CT head abdomen pelvis and left hip and I independently    visualized and interpreted imaging which showed no acute  findings Additional history obtained from patient's daughter Previous records obtained and reviewed in epic including oncology note from yesterday I consulted [facility] hospitalist Dr. Josephine Cables and discussed lab and imaging findings and discussed disposition.  Cardiac monitoring reviewed, normal sinus rhythm Social determinants considered, no significant barriers Critical Interventions: Initiation of fluids and admission for severe hyponatremia  After the interventions stated above, I reevaluated the patient and found patient to be hemodynamically stable in no distress Admission and further testing considered, she will need admission to the hospital for continued serial neurologic exams and management of hyponatremia         Final Clinical Impression(s) / ED Diagnoses Final diagnoses:  Hyponatremia  Left hip pain    Rx / DC Orders ED Discharge Orders     None         Hayden Rasmussen, MD 08/10/22 [phone]

## 2022-08-09 NOTE — Telephone Encounter (Signed)
Left message to return call 

## 2022-08-09 NOTE — Progress Notes (Signed)
CRITICAL VALUE STICKER  CRITICAL VALUE: Sodium 118  DATE & TIME NOTIFIED: 08/09/22 @ 35  MD NOTIFIED: Dr. Delton Coombes  TIME OF NOTIFICATION:1628  RESPONSE: Patient is to report to the ER.    I attempted to call patient and had to leave message on VM to return our call.

## 2022-08-09 NOTE — H&P (Signed)
History and Physical    Patient: Julia Choi TFT:[id number] DOB: 06/16/1936 DOA: 08/09/2022 DOS: the patient was seen and examined on 08/09/2022 PCP: Coral Spikes, DO  Patient coming from: Home  Chief Complaint:  Chief Complaint  Patient presents with   Abnormal labs   HPI: [name] is a 87 y.o. female with medical history significant of hypertension, hyperlipidemia, GERD who presents to the ED emergency department due to about 3-week onset of left hip and thigh pain with radiation to left knee, pain has been progressively worse and this was since on walking.  It has been difficult for her to lie in the bed, so she has been sleeping on a recliner in the living room with her feet up and she has been taking tramadol 50 mg daily along with extra strength Tylenol.  Patient denies any history of fall or injury.  She lives alone and was capable of doing her ADLs except that her daughter-in-law has been staying with her since the last 10 days and she was noted to have been more confused today. Patient was seen on consult by Dr. Delton Coombes in the afternoon, other lab work done showed hyponatremia and she was called and asked to go to the ED for further evaluation and management.  ED Course:  In the emergency department, BP was 144/68 and other vital signs were within normal range.  Workup in the ED showed normocytic anemia.  BMP showed sodium of 118, potassium 3.1, chloride 81, bicarb 24, blood glucose 144, BUN 20, creatinine 0.99, total bilirubin 1.3.  LDH 122 CT of left hip without contrast showed degenerative changes.  No specific lytic destructive process involving the iliac bone. CTA head without contrast showed no acute intracranial abnormality CT abdomen and pelvis with contrast showed no acute localizing process in the abdomen or pelvis Chest x-ray showed no acute cardiopulmonary disease Left femur x-ray was negative Potassium was replenished, IV hydration 500 mls was  provided.  Review of Systems: Review of systems as noted in the HPI. All other systems reviewed and are negative.   Past Medical History:  Diagnosis Date   Hypertension    Reflux    cholesterol, hyn   Past Surgical History:  Procedure Laterality Date   INTRAMEDULLARY (IM) NAIL INTERTROCHANTERIC Right 12/16/2016   Procedure: INTRAMEDULLARY (IM) NAIL INTERTROCHANTRIC;  Surgeon: Carole Civil, MD;  Location: [facility];  Service: Orthopedics;  Laterality: Right;   JOINT REPLACEMENT      Social History:  reports that she has never smoked. She has never used smokeless tobacco. She reports that she does not drink alcohol and does not use drugs.   Allergies  Allergen Reactions   Prednisone Palpitations    History reviewed. No pertinent family history.   Prior to Admission medications   Medication Sig Start Date End Date Taking? Authorizing Provider  acetaminophen (TYLENOL) 500 MG tablet Take 1,000 mg by mouth every 6 (six) hours as needed.   Yes [provider]  atorvastatin (LIPITOR) 10 MG tablet TAKE 1 TABLET BY MOUTH EVERY EVENING Patient taking differently: Take 10 mg by mouth daily. TAKE 1 TABLET BY MOUTH EVERY EVENING 05/03/22  Yes Cook, Jayce G, DO  calcium carbonate (OS-CAL) 1250 (500 Ca) MG chewable tablet Chew 1 tablet by mouth daily.   Yes [provider]  cetirizine (ZYRTEC) 10 MG tablet Take 10 mg by mouth daily as needed for allergies.   Yes [provider]  Cholecalciferol (VITAMIN D3) 25 MCG (1000  UT) CAPS Take 1 capsule by mouth daily.   Yes [provider]  losartan-hydrochlorothiazide (HYZAAR) 100-25 MG tablet TAKE 1 TABLET BY MOUTH EVERY DAY 04/05/22  Yes Cook, Jayce G, DO  Magnesium Hydroxide (DULCOLAX SOFT CHEWS) 1200 MG CHEW Chew 1 tablet by mouth daily.   Yes [provider]  Multiple Vitamin (MULTIVITAMIN) capsule Take 1 capsule by mouth daily.   Yes [provider]  Nebivolol HCl 20 MG TABS Take one tablet  po every day 08/24/21  Yes Cook, Jayce G, DO  pantoprazole (PROTONIX) 40 MG tablet TAKE 1 TABLET BY MOUTH EVERY DAY 07/18/22  Yes Cook, Jayce G, DO  traMADol (ULTRAM) 50 MG tablet Take 1 tablet (50 mg total) by mouth every 12 (twelve) hours as needed for moderate pain. 08/09/22  Yes Doreatha Massed, MD    Physical Exam: BP 130/67   Pulse 70   Temp 97.8 F (36.6 C) (Oral)   Resp 16   Ht 5\' 1"  (1.549 m)   Wt 73.9 kg   SpO2 100%   BMI 30.80 kg/m   General: 87 y.o. year-old female well developed well nourished in no acute distress.  Alert and oriented x3. HEENT: NCAT, EOMI Neck: Supple, trachea medial Cardiovascular: Regular rate and rhythm with no rubs or gallops.  No thyromegaly or JVD noted.  No lower extremity edema. 2/4 pulses in all 4 extremities. Respiratory: Clear to auscultation with no wheezes or rales. Good inspiratory effort. Abdomen: Soft, nontender nondistended with normal bowel sounds x4 quadrants. Muskuloskeletal: No cyanosis, clubbing or edema noted bilaterally Neuro: CN II-XII intact, strength 5/5 x 4, sensation, reflexes intact Skin: No ulcerative lesions noted or rashes Psychiatry: Judgement and insight appear normal. Mood is appropriate for condition and setting          Labs on Admission:  Basic Metabolic Panel: Recent Labs  Lab 08/09/22 1400 08/09/22 1811  NA 118* 118*  K 3.7 3.1*  CL 82* 81*  CO2 22 24  GLUCOSE 138* 144*  BUN 21 20  CREATININE 0.99 0.94  CALCIUM 9.4 9.3   Liver Function Tests: Recent Labs  Lab 08/09/22 1400 08/09/22 1811  AST 31 27  ALT 26 25  ALKPHOS 45 42  BILITOT 1.3* 1.5*  PROT 7.9 7.7  ALBUMIN 4.5 4.5   No results for input(s): "LIPASE", "AMYLASE" in the last 168 hours. No results for input(s): "AMMONIA" in the last 168 hours. CBC: Recent Labs  Lab 08/09/22 1400 08/09/22 1811  WBC 7.3 6.7  NEUTROABS 5.3 4.5  HGB 12.9 12.7  HCT 35.3* 34.8*  MCV 88.9 89.5  PLT 302 260   Cardiac Enzymes: No results for  input(s): "CKTOTAL", "CKMB", "CKMBINDEX", "TROPONINI" in the last 168 hours.  BNP (last 3 results) No results for input(s): "BNP" in the last 8760 hours.  ProBNP (last 3 results) No results for input(s): "PROBNP" in the last 8760 hours.  CBG: No results for input(s): "GLUCAP" in the last 168 hours.  Radiological Exams on Admission: CT Hip Left Wo Contrast  Result Date: 08/09/2022 CLINICAL DATA:  Lytic lesion. EXAM: CT OF THE LEFT HIP WITHOUT CONTRAST TECHNIQUE: Multidetector CT imaging of the left hip was performed according to the standard protocol. Multiplanar CT image reconstructions were also generated. RADIATION DOSE REDUCTION: This exam was performed according to the departmental dose-optimization program which includes automated exposure control, adjustment of the mA and/or kV according to patient size and/or use of iterative reconstruction technique. COMPARISON:  X-ray 07/30/2022. FINDINGS: Bones/Joint/Cartilage Mild concentric  joint space loss of the left hip. No fracture or dislocation. There is also mild degenerative changes along the left sacroiliac joint and hypertrophic changes along the pubic symphysis. No definite lytic bone lesions identified by CT. Preserved contours of the urinary bladder. The uterus is present. The visualized bowel in the pelvis is nondilated. No developing lymph node enlargement. Scattered vascular calcifications. IMPRESSION: Degenerative changes. No specific lytic destructive process involving the iliac bone. If there is further concern a bone scan or MRI could be considered. Please see separate dictation of the standard abdomen and pelvis CT with contrast from same day Electronically Signed   By: Jill Side M.D.   On: 08/09/2022 20:32   CT Head Wo Contrast  Result Date: 08/09/2022 CLINICAL DATA:  Mental status change, unknown cause EXAM: CT HEAD WITHOUT CONTRAST TECHNIQUE: Contiguous axial images were obtained from the base of the skull through the vertex  without intravenous contrast. RADIATION DOSE REDUCTION: This exam was performed according to the departmental dose-optimization program which includes automated exposure control, adjustment of the mA and/or kV according to patient size and/or use of iterative reconstruction technique. COMPARISON:  None Available. FINDINGS: Brain: Cerebral ventricle sizes are concordant with the degree of cerebral volume loss. Patchy and confluent areas of decreased attenuation are noted throughout the deep and periventricular white matter of the cerebral hemispheres bilaterally, compatible with chronic microvascular ischemic disease. No evidence of large-territorial acute infarction. No parenchymal hemorrhage. No mass lesion. No extra-axial collection. No mass effect or midline shift. No hydrocephalus. Basilar cisterns are patent. Vascular: No hyperdense vessel. Atherosclerotic calcifications are present within the cavernous internal carotid and vertebral arteries. Skull: No acute fracture or focal lesion. Sinuses/Orbits: Sphenoid sinus mucosal thickening. Paranasal sinuses and mastoid air cells are clear. Bilateral lens replacement. Otherwise the orbits are unremarkable. Other: None. IMPRESSION: No acute intracranial abnormality. Electronically Signed   By: Iven Finn M.D.   On: 08/09/2022 20:26   CT Abdomen Pelvis W Contrast  Result Date: 08/09/2022 CLINICAL DATA:  Metastatic disease evaluation for iliac lesion. EXAM: CT ABDOMEN AND PELVIS WITH CONTRAST TECHNIQUE: Multidetector CT imaging of the abdomen and pelvis was performed using the standard protocol following bolus administration of intravenous contrast. RADIATION DOSE REDUCTION: This exam was performed according to the departmental dose-optimization program which includes automated exposure control, adjustment of the mA and/or kV according to patient size and/or use of iterative reconstruction technique. CONTRAST:  163mL OMNIPAQUE IOHEXOL 300 MG/ML  SOLN  COMPARISON:  Left femur x-ray 08/09/2022 FINDINGS: Lower chest: No acute abnormality. Hepatobiliary: No focal liver abnormality is seen. No gallstones, gallbladder wall thickening, or biliary dilatation. Pancreas: Unremarkable. No pancreatic ductal dilatation or surrounding inflammatory changes. Spleen: Normal in size without focal abnormality. Adrenals/Urinary Tract: There is a 2 cm right renal cyst and a 3.5 cm left renal cyst. There is no hydronephrosis or perinephric fat stranding. The adrenal glands and bladder are within normal limits. Stomach/Bowel: Stomach is within normal limits. Appendix appears normal. No evidence of bowel wall thickening, distention, or inflammatory changes. Vascular/Lymphatic: Aortic atherosclerosis. No enlarged abdominal or pelvic lymph nodes. Reproductive: Uterus and bilateral adnexa are unremarkable. Other: No abdominal wall hernia or abnormality. No abdominopelvic ascites. Musculoskeletal: Right-sided hip screw present. No acute fracture or focal osseous lesion identified. Degenerative changes affect spine. IMPRESSION: 1. No acute localizing process in the abdomen or pelvis. 2. Bilateral renal cysts. No follow-up needed. Aortic Atherosclerosis (ICD10-I70.0). Electronically Signed   By: Ronney Asters M.D.   On: 08/09/2022 20:26  DG Chest Port 1 View  Result Date: 08/09/2022 CLINICAL DATA:  Altered mental status EXAM: PORTABLE CHEST 1 VIEW COMPARISON:  12/15/2016 x-ray FINDINGS: No consolidation, pneumothorax or effusion. Normal cardiopericardial silhouette with tortuous aorta. Overlapping cardiac leads. IMPRESSION: No acute cardiopulmonary disease Electronically Signed   By: Karen Kays M.D.   On: 08/09/2022 18:16   DG FEMUR MIN 2 VIEWS LEFT  Result Date: 08/09/2022 CLINICAL DATA:  Left femur pain. EXAM: LEFT FEMUR 2 VIEWS COMPARISON:  July 30, 2022. FINDINGS: There is no evidence of fracture or other focal bone lesions. Lucency seen in left ilium on prior radiograph is  not well visualized currently, and potentially may have represented overlying bowel gas. Soft tissues are unremarkable. IMPRESSION: Negative. Electronically Signed   By: Lupita Raider M.D.   On: 08/09/2022 14:55    EKG: I independently viewed the EKG done and my findings are as followed: EKG was not done in the ED  Assessment/Plan Present on Admission:  Hyponatremia  Lytic bone lesion of hip  Osteopenia  Essential hypertension  Mixed hyperlipidemia  GERD (gastroesophageal reflux disease)  Principal Problem:   Hyponatremia Active Problems:   Essential hypertension   Mixed hyperlipidemia   Osteopenia   GERD (gastroesophageal reflux disease)   Type 2 diabetes mellitus (HCC)   Lytic bone lesion of hip   Obesity (BMI 30-39.9)  Hyponatremia Na 118, this was 135 on 07/23/2022 Patient was on Hyzaar which contain HCTZ 25 mg daily and this could have contributed to patient's hyponatremia, this will be held at this time. Continue gentle hydration Continue to monitor sodium with serial BMPs Urine osmolality, serum osmolality and urine sodium will be checked  Hypokalemia This may be due to diuretics K+ 3.1, this was replenished  Left hip lytic lesion and pain Patient followed with Dr. Ellin Saba who recommended PET scan to evaluate for metastatic disease Continue tramadol and Tylenol  Essential hypertension Continue nebivolol  Mixed hyperlipidemia Continue Lipitor  GERD Continue Protonix  Obesity (BMI 30.8) Continue diet and lifestyle modification  Osteopenia Continue Os-Cal, vitamin D3, multivitamin  DVT prophylaxis: Lovenox  Code Status: Full code  Consults: None  Family Communication: None at bedside  Severity of Illness: The appropriate patient status for this patient is INPATIENT. Inpatient status is judged to be reasonable and necessary in order to provide the required intensity of service to ensure the patient's safety. The patient's presenting symptoms,  physical exam findings, and initial radiographic and laboratory data in the context of their chronic comorbidities is felt to place them at high risk for further clinical deterioration. Furthermore, it is not anticipated that the patient will be medically stable for discharge from the hospital within 2 midnights of admission.   * I certify that at the point of admission it is my clinical judgment that the patient will require inpatient hospital care spanning beyond 2 midnights from the point of admission due to high intensity of service, high risk for further deterioration and high frequency of surveillance required.*  Author: Frankey Shown, DO 08/09/2022 11:20 PM  For on call review [email or web address].

## 2022-08-09 NOTE — Telephone Encounter (Signed)
Julia Salt G, DO     Please advise that the labs have returned normal. Hopefully this eases her anxiety. At her age, I do not recommend as needed anxiety medications.

## 2022-08-09 NOTE — Progress Notes (Signed)
[facility] NOTE  Patient Care Team: Coral Spikes, DO as PCP - General (Family Medicine) Derek Jack, MD as Medical Oncologist (Medical Oncology) Brien Mates, RN as Oncology Nurse Navigator (Medical Oncology)  CHIEF COMPLAINTS/PURPOSE OF CONSULTATION:  Left iliac lytic lesion  HISTORY OF PRESENTING ILLNESS:  Julia Choi 87 y.o. female is seen in consultation today at the request of Dr. Lacinda Axon for further workup and management of left iliac lytic lesion.  She developed sudden onset left hip pain, achy, radiating to the left knee On 07/20/2021 without any history of fall or injury.  This pain has been progressively getting worse.  Pain is worse on walking.  She has been sleeping in recliner in her living room as she cannot lie in her bed.  She had to keep her feet up in the recliner which gives pain relief.  She is also taking tramadol 50 mg once daily along with extra strength Tylenol.  She reports that she feels that her feet are restless and she is also very nervous.  Her daughter-in-law Jackelyn Poling is staying with her for the last 10 days.  She does not report having any mammograms in the recent past.  Never had colonoscopy.  Denies any bleeding per rectum or melena.  No prior history of MI or CVA.  Until 10 days ago, she lives by herself and was independent of ADLs and IADLs.  She was a never smoker.  Dr. Lacinda Axon has ordered x-ray of her hip on 07/30/2022 which showed 1.9 cm lytic lucency in the left ilium not seen on the prior exam in 2018.  She was started on tramadol which she is taking once daily.  She cannot tolerate hydrocodone due to severe constipation.  MEDICAL HISTORY:  Past Medical History:  Diagnosis Date   Hypertension    Reflux    cholesterol, hyn    SURGICAL HISTORY: Past Surgical History:  Procedure Laterality Date   INTRAMEDULLARY (IM) NAIL INTERTROCHANTERIC Right 12/16/2016   Procedure: INTRAMEDULLARY (IM) NAIL INTERTROCHANTRIC;  Surgeon:  Carole Civil, MD;  Location: [facility];  Service: Orthopedics;  Laterality: Right;   JOINT REPLACEMENT      SOCIAL HISTORY: Social History   Socioeconomic History   Marital status: Widowed    Spouse name: Not on file   Number of children: 1   Years of education: Not on file   Highest education level: Not on file  Occupational History   Occupation: retired  Tobacco Use   Smoking status: Never   Smokeless tobacco: Never  Substance and Sexual Activity   Alcohol use: No   Drug use: No   Sexual activity: Not Currently  Other Topics Concern   Not on file  Social History Narrative   Widowed since 2018   1 son, lives in [place]   2 grandchildren   1 great granddaughter.   Social Determinants of Health   Financial Resource Strain: Low Risk  (09/12/2021)   Overall Financial Resource Strain (CARDIA)    Difficulty of Paying Living Expenses: Not hard at all  Food Insecurity: No Food Insecurity (08/09/2022)   Hunger Vital Sign    Worried About Running Out of Food in the Last Year: Never true    Ran Out of Food in the Last Year: Never true  Transportation Needs: No Transportation Needs (08/09/2022)   PRAPARE - Hydrologist (Medical): No    Lack of Transportation (Non-Medical): No  Physical Activity: Sufficiently  Active (09/12/2021)   Exercise Vital Sign    Days of Exercise per Week: 5 days    Minutes of Exercise per Session: 30 min  Stress: No Stress Concern Present (09/12/2021)   [place]    Feeling of Stress : Not at all  Social Connections: Moderately Integrated (09/12/2021)   Social Connection and Isolation Panel [NHANES]    Frequency of Communication with Friends and Family: More than three times a week    Frequency of Social Gatherings with Friends and Family: Once a week    Attends Religious Services: 1 to 4 times per year    Active Member of [employer] or Organizations: No     Attends Archivist Meetings: 1 to 4 times per year    Marital Status: Widowed  Intimate Partner Violence: Not At Risk (08/09/2022)   Humiliation, Afraid, Rape, and Kick questionnaire    Fear of Current or Ex-Partner: No    Emotionally Abused: No    Physically Abused: No    Sexually Abused: No    FAMILY HISTORY: No family history on file.  ALLERGIES:  is allergic to prednisone.  MEDICATIONS:  Current Outpatient Medications  Medication Sig Dispense Refill   atorvastatin (LIPITOR) 10 MG tablet TAKE 1 TABLET BY MOUTH EVERY EVENING 90 tablet 1   cetirizine (ZYRTEC) 10 MG tablet Take 10 mg by mouth daily as needed for allergies.     famotidine (PEPCID) 20 MG tablet Take 20 mg by mouth 2 (two) times daily.     fluorouracil (EFUDEX) 5 % cream fluorouracil 5 % topical cream  APPLY TO AFFECTED AREAS TWICE A DAY FOR 2 WEEKS THEN LEAVE OFF FOR 2 WEEKS AND REPEAT     losartan-hydrochlorothiazide (HYZAAR) 100-25 MG tablet TAKE 1 TABLET BY MOUTH EVERY DAY 90 tablet 1   Nebivolol HCl 20 MG TABS Take one tablet po every day 30 tablet 1   pantoprazole (PROTONIX) 40 MG tablet TAKE 1 TABLET BY MOUTH EVERY DAY 90 tablet 1   traMADol (ULTRAM) 50 MG tablet Take 1 tablet (50 mg total) by mouth every 12 (twelve) hours as needed for moderate pain. 30 tablet 0   No current facility-administered medications for this visit.    REVIEW OF SYSTEMS:   Constitutional: Denies fevers, chills or abnormal night sweats Eyes: Denies blurriness of vision, double vision or watery eyes Ears, nose, mouth, throat, and face: Denies mucositis or sore throat Respiratory: Denies cough, dyspnea or wheezes Cardiovascular: Denies palpitation, chest discomfort or lower extremity swelling Gastrointestinal: Positive for constipation. Skin: Denies abnormal skin rashes Lymphatics: Denies new lymphadenopathy or easy bruising Neurological:Denies numbness, tingling or new weaknesses Behavioral/Psych: Mood is stable, no new  changes  All other systems were reviewed with the patient and are negative.  PHYSICAL EXAMINATION: ECOG PERFORMANCE STATUS: 2 - Symptomatic, <50% confined to bed  There were no vitals filed for this visit. There were no vitals filed for this visit.  GENERAL:alert, no distress and comfortable SKIN: skin color, texture, turgor are normal, no rashes or significant lesions EYES: normal, conjunctiva are pink and non-injected, sclera clear OROPHARYNX:no exudate, no erythema and lips, buccal mucosa, and tongue normal  NECK: supple, thyroid normal size, non-tender, without nodularity LYMPH:  no palpable lymphadenopathy in the cervical, axillary or inguinal LUNGS: clear to auscultation and percussion with normal breathing effort HEART: regular rate & rhythm and no murmurs and no lower extremity edema ABDOMEN:abdomen soft, non-tender and normal bowel sounds Musculoskeletal:no cyanosis  of digits and no clubbing  PSYCH: alert & oriented x 3 with fluent speech NEURO: no focal motor/sensory deficits  LABORATORY DATA:  I have reviewed the data as listed Lab Results  Component Value Date   WBC 7.3 08/09/2022   HGB 12.9 08/09/2022   HCT 35.3 (L) 08/09/2022   MCV 88.9 08/09/2022   PLT 302 08/09/2022     Chemistry      Component Value Date/Time   NA 118 (LL) 08/09/2022 1400   NA 135 07/23/2022 1123   K 3.7 08/09/2022 1400   CL 82 (L) 08/09/2022 1400   CO2 22 08/09/2022 1400   BUN 21 08/09/2022 1400   BUN 13 07/23/2022 1123   CREATININE 0.99 08/09/2022 1400      Component Value Date/Time   CALCIUM 9.4 08/09/2022 1400   ALKPHOS 45 08/09/2022 1400   AST 31 08/09/2022 1400   ALT 26 08/09/2022 1400   BILITOT 1.3 (H) 08/09/2022 1400   BILITOT 0.9 07/23/2022 1123       RADIOGRAPHIC STUDIES: I have personally reviewed the radiological images as listed and agreed with the findings in the report. DG FEMUR MIN 2 VIEWS LEFT  Result Date: 08/09/2022 CLINICAL DATA:  Left femur pain. EXAM:  LEFT FEMUR 2 VIEWS COMPARISON:  July 30, 2022. FINDINGS: There is no evidence of fracture or other focal bone lesions. Lucency seen in left ilium on prior radiograph is not well visualized currently, and potentially may have represented overlying bowel gas. Soft tissues are unremarkable. IMPRESSION: Negative. Electronically Signed   By: Lupita Raider M.D.   On: 08/09/2022 14:55   DG Hip Unilat W OR W/O Pelvis 2-3 Views Left  Result Date: 07/30/2022 CLINICAL DATA:  Left hip pain for 1 week no injury. EXAM: DG HIP (WITH OR WITHOUT PELVIS) 2-3V LEFT COMPARISON:  December 16, 2016, December 31, 2016 FINDINGS: There is no evidence of hip fracture or dislocation. Status post prior compression screw placement of the right proximal femur. 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 1.9 cm lytic lucency in the left ilium not seen on prior exam of 2018. IMPRESSION: 1. No acute fracture or dislocation. 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 2. 1.9 cm lytic lucency in the left ilium not seen on prior exam of 2018. Nonspecific. Differential diagnosis includes but is not limited to multiple myeloma, metastatic disease. Clinical correlation is recommended. Electronically Signed   By: Sherian Rein M.D.   On: 07/30/2022 12:41    ASSESSMENT:  1.  Left hip lytic lesion and pain: - Left hip pain started on 07/20/2021, achy, radiating to the left knee, progressively was on walking, relieved when lying in a recliner with her feet up. - Left hip x-ray (07/30/2022): 1.9 cm lytic lucency in the left ilium not seen in 2018. - 08/01/2022: SPEP-negative, FLC normal, immunofixation negative.  24-hour urine was also negative. - She is currently on extra strength Tylenol and tramadol 50 mg as needed. - Denies any weight loss.  Did not have mammograms in the last decade.  Did not have colonoscopy in her lifetime.  2.  Social/family history: - She lives by herself at home and was  independent of ADLs and IADLs until 10 days ago.  Her daughter-in-law Eunice Blase moved in with her 10 days ago to help her day-to-day activities.  She is a retired Psychologist, occupational.  She was a never smoker and nonalcoholic. - Sister had brain cancer.  PLAN:  1.  Left hip lytic lesion and pain: - Multiple myeloma workup is negative. - Recommend PET scan to evaluate for metastatic disease. - Will check CBC, CMP, LDH and tumor markers. - RTC after the PET scan.  2.  Left hip pain: - I have given prescription for tramadol 50 mg every 12 hours as needed. - She was told to take 1 dose of NSAID alternating with Tylenol.   Orders Placed This Encounter  Procedures   NM PET Image Initial (PI) Skull Base To Thigh    Standing Status:   Future    Standing Expiration Date:   08/09/2023    Order Specific Question:   If indicated for the ordered procedure, I authorize the administration of a radiopharmaceutical per Radiology protocol    Answer:   Yes    Order Specific Question:   Preferred imaging location?    Answer:   [facility]    Order Specific Question:   Release to patient    Answer:   Immediate   DG FEMUR MIN 2 VIEWS LEFT    Standing Status:   Future    Number of Occurrences:   1    Standing Expiration Date:   08/10/2023    Order Specific Question:   Reason for Exam (SYMPTOM  OR DIAGNOSIS REQUIRED)    Answer:   L Hip/Femur pain    Order Specific Question:   Preferred imaging location?    Answer:   [facility]    Order Specific Question:   Release to patient    Answer:   Immediate   CBC with Differential    Standing Status:   Future    Number of Occurrences:   1    Standing Expiration Date:   08/09/2023   Comprehensive metabolic panel    Standing Status:   Future    Number of Occurrences:   1    Standing Expiration Date:   08/09/2023   Lactate dehydrogenase    Standing Status:   Future    Number of Occurrences:   1    Standing Expiration Date:   08/09/2023   Cancer antigen 15-3     Standing Status:   Future    Number of Occurrences:   1    Standing Expiration Date:   08/09/2023   Cancer antigen 27.29    Standing Status:   Future    Number of Occurrences:   1    Standing Expiration Date:   08/09/2023    All questions were answered. The patient knows to call the clinic with any problems, questions or concerns.      Derek Jack, MD 08/09/2022 5:20 PM

## 2022-08-09 NOTE — Patient Instructions (Addendum)
[facility]  Discharge Instructions  You were seen and examined today by Dr. Delton Coombes. Dr. Delton Coombes is a medical oncologist, meaning that he specializes in the treatment of cancer diagnoses. Dr. Delton Coombes discussed your past medical history, family history of cancers, and the events that led to you being here today.  You were referred to Dr. Delton Coombes due to an abnormal X-Ray that revealed an area concerning for cancer on your hip bone.  Dr. Delton Coombes has recommended additional labs and a PET scan. A PET scan is specialized CT scan that illuminates where there is a concern for cancer presence in your body.  Follow-up as scheduled.  Thank you for choosing [facility] to provide your oncology and hematology care.   To afford each patient quality time with our provider, please arrive at least 15 minutes before your scheduled appointment time. You may need to reschedule your appointment if you arrive late (10 or more minutes). Arriving late affects you and other patients whose appointments are after yours.  Also, if you miss three or more appointments without notifying the office, you may be dismissed from the clinic at the provider's discretion.    Again, thank you for choosing [facility].  Our hope is that these requests will decrease the amount of time that you wait before being seen by our physicians.   If you have a lab appointment with the [facility] please come in thru the Main Entrance and check in at the main information desk.           _____________________________________________________________  Should you have questions after your visit to [facility], please contact our office at [phone] and follow the prompts.  Our office hours are 8:00 a.m. to 4:30 p.m. Monday - Thursday and 8:00 a.m. to 2:30 p.m. Friday.  Please note that voicemails left after 4:00 p.m. may not be returned until the  following business day.  We are closed weekends and all major holidays.  You do have access to a nurse 24-7, just call the main number to the clinic [phone] and do not press any options, hold on the line and a nurse will answer the phone.    For prescription refill requests, have your pharmacy contact our office and allow 72 hours.    Masks are optional in the cancer centers. If you would like for your care team to wear a mask while they are taking care of you, please let them know. You may have one support person who is at least 85 years old accompany you for your appointments.

## 2022-08-09 NOTE — Progress Notes (Signed)
I was able to get patient's daughter in law, Jackelyn Poling on the phone.  She was advised of lab results and the need to return to the ER for evaluation.  She voices understanding and will get patient here as quick as she can.    I spoke with Brayton Layman., Triage RN in the ER and gave report.  They can treat as they deem necessary and call Dr. Delton Coombes with any questions or concerns.

## 2022-08-09 NOTE — Telephone Encounter (Signed)
Patient advised per Dr Lacinda Axon:  Please advise that the labs have returned normal. Hopefully this eases her anxiety. At her age, I do not recommend as needed anxiety medications.   Patient verbalized understanding.

## 2022-08-10 ENCOUNTER — Inpatient Hospital Stay (HOSPITAL_COMMUNITY): Payer: Medicare Other

## 2022-08-10 ENCOUNTER — Encounter (HOSPITAL_COMMUNITY): Payer: Self-pay | Admitting: Internal Medicine

## 2022-08-10 DIAGNOSIS — E669 Obesity, unspecified: Secondary | ICD-10-CM | POA: Diagnosis not present

## 2022-08-10 DIAGNOSIS — M898X5 Other specified disorders of bone, thigh: Secondary | ICD-10-CM | POA: Diagnosis not present

## 2022-08-10 DIAGNOSIS — G9341 Metabolic encephalopathy: Secondary | ICD-10-CM | POA: Insufficient documentation

## 2022-08-10 DIAGNOSIS — E871 Hypo-osmolality and hyponatremia: Secondary | ICD-10-CM | POA: Diagnosis not present

## 2022-08-10 LAB — MAGNESIUM: Magnesium: 1.8 mg/dL (ref 1.7–2.4)

## 2022-08-10 LAB — OSMOLALITY: Osmolality: 251 mOsm/kg — ABNORMAL LOW (ref 275–295)

## 2022-08-10 LAB — BASIC METABOLIC PANEL
Anion gap: 11 (ref 5–15)
Anion gap: 8 (ref 5–15)
Anion gap: 11 (ref 5–15)
BUN: 20 mg/dL (ref 8–23)
BUN: 21 mg/dL (ref 8–23)
BUN: 19 mg/dL (ref 8–23)
CO2: 21 mmol/L — ABNORMAL LOW (ref 22–32)
CO2: 23 mmol/L (ref 22–32)
CO2: 20 mmol/L — ABNORMAL LOW (ref 22–32)
Calcium: 8.7 mg/dL — ABNORMAL LOW (ref 8.9–10.3)
Calcium: 8.9 mg/dL (ref 8.9–10.3)
Calcium: 8.5 mg/dL — ABNORMAL LOW (ref 8.9–10.3)
Chloride: 88 mmol/L — ABNORMAL LOW (ref 98–111)
Chloride: 90 mmol/L — ABNORMAL LOW (ref 98–111)
Chloride: 92 mmol/L — ABNORMAL LOW (ref 98–111)
Creatinine, Ser: 1.01 mg/dL — ABNORMAL HIGH (ref 0.44–1.00)
Creatinine, Ser: 0.97 mg/dL (ref 0.44–1.00)
Creatinine, Ser: 0.87 mg/dL (ref 0.44–1.00)
GFR, Estimated: 55 mL/min — ABNORMAL LOW (ref >60)
GFR, Estimated: 58 mL/min — ABNORMAL LOW (ref >60)
GFR, Estimated: >60 mL/min (ref >60)
Glucose, Bld: 135 mg/dL — ABNORMAL HIGH (ref 70–99)
Glucose, Bld: 123 mg/dL — ABNORMAL HIGH (ref 70–99)
Glucose, Bld: 98 mg/dL (ref 70–99)
Potassium: 3.6 mmol/L (ref 3.5–5.1)
Potassium: 3.9 mmol/L (ref 3.5–5.1)
Potassium: 3.8 mmol/L (ref 3.5–5.1)
Sodium: 120 mmol/L — ABNORMAL LOW (ref 135–145)
Sodium: 121 mmol/L — ABNORMAL LOW (ref 135–145)
Sodium: 123 mmol/L — ABNORMAL LOW (ref 135–145)

## 2022-08-10 LAB — CBC
HCT: 34.2 % — ABNORMAL LOW (ref 36.0–46.0)
Hemoglobin: 12.3 g/dL (ref 12.0–15.0)
MCH: 32.5 pg (ref 26.0–34.0)
MCHC: 36 g/dL (ref 30.0–36.0)
MCV: 90.2 fL (ref 80.0–100.0)
Platelets: 223 10*3/uL (ref 150–400)
RBC: 3.79 MIL/uL — ABNORMAL LOW (ref 3.87–5.11)
RDW: 12.2 % (ref 11.5–15.5)
WBC: 9.3 10*3/uL (ref 4.0–10.5)
nRBC: 0 % (ref 0.0–0.2)

## 2022-08-10 LAB — CK: Total CK: 75 U/L (ref 38–234)

## 2022-08-10 LAB — URINALYSIS, ROUTINE W REFLEX MICROSCOPIC
Bacteria, UA: NONE SEEN
Bilirubin Urine: NEGATIVE
Glucose, UA: NEGATIVE mg/dL
Hgb urine dipstick: NEGATIVE
Ketones, ur: NEGATIVE mg/dL
Nitrite: NEGATIVE
Protein, ur: NEGATIVE mg/dL
Specific Gravity, Urine: 1.019 (ref 1.005–1.030)
pH: 7 (ref 5.0–8.0)

## 2022-08-10 LAB — COMPREHENSIVE METABOLIC PANEL
ALT: 24 U/L (ref 0–44)
AST: 26 U/L (ref 15–41)
Albumin: 4 g/dL (ref 3.5–5.0)
Alkaline Phosphatase: 37 U/L — ABNORMAL LOW (ref 38–126)
Anion gap: 11 (ref 5–15)
BUN: 18 mg/dL (ref 8–23)
CO2: 22 mmol/L (ref 22–32)
Calcium: 9.1 mg/dL (ref 8.9–10.3)
Chloride: 87 mmol/L — ABNORMAL LOW (ref 98–111)
Creatinine, Ser: 0.82 mg/dL (ref 0.44–1.00)
GFR, Estimated: >60 mL/min (ref >60)
Glucose, Bld: 104 mg/dL — ABNORMAL HIGH (ref 70–99)
Potassium: 2.8 mmol/L — ABNORMAL LOW (ref 3.5–5.1)
Sodium: 120 mmol/L — ABNORMAL LOW (ref 135–145)
Total Bilirubin: 1 mg/dL (ref 0.3–1.2)
Total Protein: 6.9 g/dL (ref 6.5–8.1)

## 2022-08-10 LAB — TSH: TSH: 1.792 u[IU]/mL (ref 0.350–4.500)

## 2022-08-10 LAB — PHOSPHORUS: Phosphorus: 3.1 mg/dL (ref 2.5–4.6)

## 2022-08-10 LAB — CREATININE, URINE, RANDOM: Creatinine, Urine: 28.23 mg/dL

## 2022-08-10 LAB — CANCER ANTIGEN 27.29: CA 27.29: 22.1 U/mL (ref 0.0–38.6)

## 2022-08-10 LAB — SODIUM, URINE, RANDOM: Sodium, Ur: 37 mmol/L

## 2022-08-10 LAB — VITAMIN B12: Vitamin B-12: 755 pg/mL (ref 180–914)

## 2022-08-10 LAB — SEDIMENTATION RATE: Sed Rate: 16 mm/hr (ref 0–22)

## 2022-08-10 LAB — FOLATE: Folate: 21.9 ng/mL (ref >5.9)

## 2022-08-10 LAB — MRSA NEXT GEN BY PCR, NASAL: MRSA by PCR Next Gen: NOT DETECTED

## 2022-08-10 LAB — OSMOLALITY, URINE: Osmolality, Ur: 240 mOsm/kg — ABNORMAL LOW (ref 300–900)

## 2022-08-10 MED ORDER — POTASSIUM CHLORIDE CRYS ER 20 MEQ PO TBCR
40.0000 meq | EXTENDED_RELEASE_TABLET | Freq: Once | ORAL | Status: AC
  Administered 2022-08-10: 40 meq via ORAL
  Filled 2022-08-10: qty 2

## 2022-08-10 MED ORDER — PANTOPRAZOLE SODIUM 40 MG PO TBEC
40.0000 mg | DELAYED_RELEASE_TABLET | Freq: Every day | ORAL | Status: DC
  Administered 2022-08-10 – 2022-08-13 (×4): 40 mg via ORAL
  Filled 2022-08-10 (×4): qty 1

## 2022-08-10 MED ORDER — MIDAZOLAM HCL 2 MG/2ML IJ SOLN
1.0000 mg | Freq: Once | INTRAMUSCULAR | Status: AC
  Administered 2022-08-10: 1 mg via INTRAVENOUS
  Filled 2022-08-10: qty 2

## 2022-08-10 MED ORDER — ACETAMINOPHEN 325 MG PO TABS
650.0000 mg | ORAL_TABLET | Freq: Four times a day (QID) | ORAL | Status: DC
  Administered 2022-08-10 – 2022-08-13 (×10): 650 mg via ORAL
  Filled 2022-08-10 (×10): qty 2

## 2022-08-10 MED ORDER — VITAMIN D 25 MCG (1000 UNIT) PO TABS
1000.0000 [IU] | ORAL_TABLET | Freq: Every day | ORAL | Status: DC
  Administered 2022-08-10 – 2022-08-13 (×4): 1000 [IU] via ORAL
  Filled 2022-08-10 (×4): qty 1

## 2022-08-10 MED ORDER — TRAMADOL HCL 50 MG PO TABS
50.0000 mg | ORAL_TABLET | Freq: Two times a day (BID) | ORAL | Status: DC | PRN
  Administered 2022-08-10 – 2022-08-13 (×5): 50 mg via ORAL
  Filled 2022-08-10 (×5): qty 1

## 2022-08-10 MED ORDER — ACETAMINOPHEN 325 MG PO TABS
650.0000 mg | ORAL_TABLET | Freq: Four times a day (QID) | ORAL | Status: DC | PRN
  Administered 2022-08-10 (×3): 650 mg via ORAL
  Filled 2022-08-10 (×3): qty 2

## 2022-08-10 MED ORDER — ONDANSETRON HCL 4 MG PO TABS: 4.0000 mg | ORAL_TABLET | Freq: Four times a day (QID) | ORAL | Status: DC | PRN

## 2022-08-10 MED ORDER — CALCIUM CARBONATE 1250 (500 CA) MG PO TABS
1250.0000 mg | ORAL_TABLET | Freq: Every day | ORAL | Status: DC
  Administered 2022-08-10 – 2022-08-13 (×4): 1250 mg via ORAL
  Filled 2022-08-10 (×4): qty 1

## 2022-08-10 MED ORDER — CHLORHEXIDINE GLUCONATE CLOTH 2 % EX PADS
6.0000 | MEDICATED_PAD | Freq: Every day | CUTANEOUS | Status: DC
  Administered 2022-08-10 – 2022-08-12 (×3): 6 via TOPICAL

## 2022-08-10 MED ORDER — NEBIVOLOL HCL 10 MG PO TABS
20.0000 mg | ORAL_TABLET | Freq: Every day | ORAL | Status: DC
  Administered 2022-08-10 – 2022-08-13 (×4): 20 mg via ORAL
  Filled 2022-08-10 (×6): qty 2

## 2022-08-10 MED ORDER — SODIUM CHLORIDE 0.9 % IV SOLN: INTRAVENOUS | Status: AC

## 2022-08-10 MED ORDER — ATORVASTATIN CALCIUM 10 MG PO TABS
10.0000 mg | ORAL_TABLET | Freq: Every day | ORAL | Status: DC
  Administered 2022-08-10 – 2022-08-13 (×4): 10 mg via ORAL
  Filled 2022-08-10 (×4): qty 1

## 2022-08-10 MED ORDER — ACETAMINOPHEN 650 MG RE SUPP: 650.0000 mg | Freq: Four times a day (QID) | RECTAL | Status: DC | PRN

## 2022-08-10 MED ORDER — ONDANSETRON HCL 4 MG/2ML IJ SOLN: 4.0000 mg | Freq: Four times a day (QID) | INTRAMUSCULAR | Status: DC | PRN

## 2022-08-10 MED ORDER — ENOXAPARIN SODIUM 40 MG/0.4ML IJ SOSY
40.0000 mg | PREFILLED_SYRINGE | INTRAMUSCULAR | Status: DC
  Administered 2022-08-10 – 2022-08-13 (×4): 40 mg via SUBCUTANEOUS
  Filled 2022-08-10 (×4): qty 0.4

## 2022-08-10 MED ORDER — ADULT MULTIVITAMIN W/MINERALS CH
1.0000 | ORAL_TABLET | Freq: Every day | ORAL | Status: DC
  Administered 2022-08-10 – 2022-08-13 (×4): 1 via ORAL
  Filled 2022-08-10 (×4): qty 1

## 2022-08-10 NOTE — Hospital Course (Addendum)
87 y.o. female with medical history significant of hypertension, hyperlipidemia, GERD who presents to the ED emergency department due to about 3-week onset of left hip and thigh pain with radiation to left knee.   It has been difficult for her to lie in the bed, so she has been sleeping on a recliner in the living room with her feet up and she has been taking tramadol 50 mg daily along with extra strength Tylenol.  Patient denies any history of fall or injury.  She lives alone and was capable of doing her ADLs except that her daughter-in-law has been staying with her since the last 10 days and she was noted to have been more confused on the day of admission.  The patient had been taking tramadol for her pain at home.  Apparently she developed constipation with hydrocodone and had to take a laxative. Notably, the patient had an office visit with Dr. Delton Coombes on 08/09/2022.  She is currently being worked up for a lytic type lesion on her left ilium.  Plans are for an outpatient PET scan.  Routine labs were obtained and showed the patient had a sodium of 118.  As result, the patient was brought to the emergency department for further evaluation. In the ED, the patient was afebrile hemodynamically stable with oxygen saturation 98% on room air. Sodium 118, potassium 3.7, bicarbonate 22, serum creatinine 0.99.  WBC 7.3, hemoglobin 12.9, platelets 302.  The patient was started on normal saline.  The patient was started on time limited IVF.  Nephrology was consulted to assist with management.

## 2022-08-10 NOTE — Progress Notes (Addendum)
PROGRESS NOTE  Julia Choi CWC:[id number] DOB: 1936-06-17 DOA: 08/09/2022 PCP: Coral Spikes, DO  Brief History:  87 y.o. female with medical history significant of hypertension, hyperlipidemia, GERD who presents to the ED emergency department due to about 3-week onset of left hip and thigh pain with radiation to left knee.   It has been difficult for her to lie in the bed, so she has been sleeping on a recliner in the living room with her feet up and she has been taking tramadol 50 mg daily along with extra strength Tylenol.  Patient denies any history of fall or injury.  She lives alone and was capable of doing her ADLs except that her daughter-in-law has been staying with her since the last 10 days and she was noted to have been more confused on the day of admission.  The patient had been taking tramadol for her pain at home.  Apparently she developed constipation with hydrocodone and had to take a laxative. Notably, the patient had an office visit with Dr. Delton Coombes on 08/09/2022.  She is currently being worked up for a lytic type lesion on her left ilium.  Plans are for an outpatient PET scan.  Routine labs were obtained and showed the patient had a sodium of 118.  As result, the patient was brought to the emergency department for further evaluation. In the ED, the patient was afebrile hemodynamically stable with oxygen saturation 98% on room air. Sodium 118, potassium 3.7, bicarbonate 22, serum creatinine 0.99.  WBC 7.3, hemoglobin 12.9, platelets 302.  The patient was started on normal saline.  Nephrology was consulted to assist with management.   Assessment/Plan:  Hyponatremia -Serum osmolarity 251 -Urine sodium 37 -urine osm pending -suspect poor solute intake and a component of polydipsia -Nephrology consult appreciated  Left hip lytic lesion and pain -Immunofixation was normal -Patient follows Dr. Delton Coombes with plans for PET scan -Continue tramadol and  acetaminophen -MR left hip -check CK -ESR  Acute metabolic Encephalopathy -CT brain neg -UA neg for pyuria -B12 -TSH -folate -likely combination of hyponatremia and opioids  Essential hypertension -Continue nebivolol  Hypokalemia -replete -hold  HCTZ -mag 1.8  Mixed hyperlipidemia Continue Lipitor   GERD Continue Protonix   Obesity (BMI 30.8) Continue diet and lifestyle modification   Osteopenia Continue Os-Cal, vitamin D3, multivitamin  Goals of Care -confirmed with patient--DNR    Family Communication:   daughter in law updated 1/26  Consultants:  nephrology  Code Status:  FULL  DVT Prophylaxis:  [place] Lovenox   Procedures: As Listed in Progress Note Above  Antibiotics: None       Subjective: Complains of left hip pain and left thigh pain.  Denies f/c, cp, sob, n/v/d  Objective: Vitals:   08/10/22 0500 08/10/22 0521 08/10/22 0600 08/10/22 0800  BP: (!) 124/59  (!) 135/56   Pulse: 63  61   Resp: 12  13   Temp:  98.1 F (36.7 C)    TempSrc:  Oral    SpO2: 96%  98% 98%  Weight:      Height:        Intake/Output Summary (Last 24 hours) at 08/10/2022 0935 Last data filed at 08/10/2022 7616 Gross per 24 hour  Intake 4.14 ml  Output 900 ml  Net -895.86 ml   Weight change:  Exam:  General:  Pt is alert, follows commands appropriately, not in acute distress HEENT: No icterus, No thrush, No  neck mass, [place]/AT Cardiovascular: RRR, S1/S2, no rubs, no gallops Respiratory: CTA bilaterally, no wheezing, no crackles, no rhonchi Abdomen: Soft/+BS, non tender, non distended, no guarding Extremities: No edema, No lymphangitis, No petechiae, No rashes, no synovitis Neuro:  CN II-XII intact, strength 4/5 in RUE, RLE, strength 4/5 LUE, 4-/5LLE; sensation intact bilateral; no dysmetria; babinski equivocal    Data Reviewed: I have personally reviewed following labs and imaging studies Basic Metabolic Panel: Recent Labs  Lab 08/09/22 1400  08/09/22 1811 08/10/22 0415  NA 118* 118* 120*  K 3.7 3.1* 2.8*  CL 82* 81* 87*  CO2 22 24 22   GLUCOSE 138* 144* 104*  BUN 21 20 18   CREATININE 0.99 0.94 0.82  CALCIUM 9.4 9.3 9.1  MG  --   --  1.8  PHOS  --   --  3.1   Liver Function Tests: Recent Labs  Lab 08/09/22 1400 08/09/22 1811 08/10/22 0415  AST 31 27 26   ALT 26 25 24   ALKPHOS 45 42 37*  BILITOT 1.3* 1.5* 1.0  PROT 7.9 7.7 6.9  ALBUMIN 4.5 4.5 4.0   No results for input(s): "LIPASE", "AMYLASE" in the last 168 hours. No results for input(s): "AMMONIA" in the last 168 hours. Coagulation Profile: No results for input(s): "INR", "PROTIME" in the last 168 hours. CBC: Recent Labs  Lab 08/09/22 1400 08/09/22 1811 08/10/22 0415  WBC 7.3 6.7 9.3  NEUTROABS 5.3 4.5  --   HGB 12.9 12.7 12.3  HCT 35.3* 34.8* 34.2*  MCV 88.9 89.5 90.2  PLT 302 260 223   Cardiac Enzymes: No results for input(s): "CKTOTAL", "CKMB", "CKMBINDEX", "TROPONINI" in the last 168 hours. BNP: Invalid input(s): "POCBNP" CBG: No results for input(s): "GLUCAP" in the last 168 hours. HbA1C: No results for input(s): "HGBA1C" in the last 72 hours. Urine analysis:    Component Value Date/Time   COLORURINE YELLOW 08/09/2022 0050   APPEARANCEUR CLEAR 08/09/2022 0050   LABSPEC 1.019 08/09/2022 0050   PHURINE 7.0 08/09/2022 0050   GLUCOSEU NEGATIVE 08/09/2022 0050   HGBUR NEGATIVE 08/09/2022 0050   BILIRUBINUR NEGATIVE 08/09/2022 0050   KETONESUR NEGATIVE 08/09/2022 0050   PROTEINUR NEGATIVE 08/09/2022 0050   NITRITE NEGATIVE 08/09/2022 0050   LEUKOCYTESUR SMALL (A) 08/09/2022 0050   Sepsis Labs: @LABRCNTIP (procalcitonin:4,lacticidven:4) ) Recent Results (from the past 240 hour(s))  MRSA Next Gen by PCR, Nasal     Status: None   Collection Time: 08/10/22 12:20 AM   Specimen: Nasal Mucosa; Nasal Swab  Result Value Ref Range Status   MRSA by PCR Next Gen NOT DETECTED NOT DETECTED Final    Comment: (NOTE) The GeneXpert MRSA Assay (FDA  approved for NASAL specimens only), is one component of a comprehensive MRSA colonization surveillance program. It is not intended to diagnose MRSA infection nor to guide or monitor treatment for MRSA infections. Test performance is not FDA approved in patients less than 73 years old. Performed at [facility], [address], [place], [place] [address]      Scheduled Meds:  atorvastatin  10 mg Oral Daily   calcium carbonate  1,250 mg Oral Daily   Chlorhexidine Gluconate Cloth  6 each Topical Q0600   cholecalciferol  1,000 Units Oral Daily   enoxaparin (LOVENOX) injection  40 mg Subcutaneous Q24H   multivitamin with minerals  1 tablet Oral Daily   nebivolol  20 mg Oral Daily   pantoprazole  40 mg Oral Daily   Continuous Infusions:  sodium chloride 75 mL/hr at 08/10/22  2376    Procedures/Studies: CT Hip Left Wo Contrast  Result Date: 08/09/2022 CLINICAL DATA:  Lytic lesion. EXAM: CT OF THE LEFT HIP WITHOUT CONTRAST TECHNIQUE: Multidetector CT imaging of the left hip was performed according to the standard protocol. Multiplanar CT image reconstructions were also generated. RADIATION DOSE REDUCTION: This exam was performed according to the departmental dose-optimization program which includes automated exposure control, adjustment of the mA and/or kV according to patient size and/or use of iterative reconstruction technique. COMPARISON:  X-ray 07/30/2022. FINDINGS: Bones/Joint/Cartilage Mild concentric joint space loss of the left hip. No fracture or dislocation. There is also mild degenerative changes along the left sacroiliac joint and hypertrophic changes along the pubic symphysis. No definite lytic bone lesions identified by CT. Preserved contours of the urinary bladder. The uterus is present. The visualized bowel in the pelvis is nondilated. No developing lymph node enlargement. Scattered vascular calcifications. IMPRESSION: Degenerative changes. No specific lytic destructive process  involving the iliac bone. If there is further concern a bone scan or MRI could be considered. Please see separate dictation of the standard abdomen and pelvis CT with contrast from same day Electronically Signed   By: Karen Kays M.D.   On: 08/09/2022 20:32   CT Head Wo Contrast  Result Date: 08/09/2022 CLINICAL DATA:  Mental status change, unknown cause EXAM: CT HEAD WITHOUT CONTRAST TECHNIQUE: Contiguous axial images were obtained from the base of the skull through the vertex without intravenous contrast. RADIATION DOSE REDUCTION: This exam was performed according to the departmental dose-optimization program which includes automated exposure control, adjustment of the mA and/or kV according to patient size and/or use of iterative reconstruction technique. COMPARISON:  None Available. FINDINGS: Brain: Cerebral ventricle sizes are concordant with the degree of cerebral volume loss. Patchy and confluent areas of decreased attenuation are noted throughout the deep and periventricular white matter of the cerebral hemispheres bilaterally, compatible with chronic microvascular ischemic disease. No evidence of large-territorial acute infarction. No parenchymal hemorrhage. No mass lesion. No extra-axial collection. No mass effect or midline shift. No hydrocephalus. Basilar cisterns are patent. Vascular: No hyperdense vessel. Atherosclerotic calcifications are present within the cavernous internal carotid and vertebral arteries. Skull: No acute fracture or focal lesion. Sinuses/Orbits: Sphenoid sinus mucosal thickening. Paranasal sinuses and mastoid air cells are clear. Bilateral lens replacement. Otherwise the orbits are unremarkable. Other: None. IMPRESSION: No acute intracranial abnormality. Electronically Signed   By: Tish Frederickson M.D.   On: 08/09/2022 20:26   CT Abdomen Pelvis W Contrast  Result Date: 08/09/2022 CLINICAL DATA:  Metastatic disease evaluation for iliac lesion. EXAM: CT ABDOMEN AND PELVIS  WITH CONTRAST TECHNIQUE: Multidetector CT imaging of the abdomen and pelvis was performed using the standard protocol following bolus administration of intravenous contrast. RADIATION DOSE REDUCTION: This exam was performed according to the departmental dose-optimization program which includes automated exposure control, adjustment of the mA and/or kV according to patient size and/or use of iterative reconstruction technique. CONTRAST:  OMNIPAQUE IOHEXOL 300 MG/ML  SOLN COMPARISON:  Left femur x-ray 08/09/2022 FINDINGS: Lower chest: No acute abnormality. Hepatobiliary: No focal liver abnormality is seen. No gallstones, gallbladder wall thickening, or biliary dilatation. Pancreas: Unremarkable. No pancreatic ductal dilatation or surrounding inflammatory changes. Spleen: Normal in size without focal abnormality. Adrenals/Urinary Tract: There is a 2 cm right renal cyst and a 3.5 cm left renal cyst. There is no hydronephrosis or perinephric fat stranding. The adrenal glands and bladder are within normal limits. Stomach/Bowel: Stomach is within normal limits. Appendix appears  normal. No evidence of bowel wall thickening, distention, or inflammatory changes. Vascular/Lymphatic: Aortic atherosclerosis. No enlarged abdominal or pelvic lymph nodes. Reproductive: Uterus and bilateral adnexa are unremarkable. Other: No abdominal wall hernia or abnormality. No abdominopelvic ascites. Musculoskeletal: Right-sided hip screw present. No acute fracture or focal osseous lesion identified. Degenerative changes affect spine. IMPRESSION: 1. No acute localizing process in the abdomen or pelvis. 2. Bilateral renal cysts. No follow-up needed. Aortic Atherosclerosis (ICD10-I70.0). Electronically Signed   By: Darliss Cheney M.D.   On: 08/09/2022 20:26   DG Chest Port 1 View  Result Date: 08/09/2022 CLINICAL DATA:  Altered mental status EXAM: PORTABLE CHEST 1 VIEW COMPARISON:  12/15/2016 x-ray FINDINGS: No consolidation,  pneumothorax or effusion. Normal cardiopericardial silhouette with tortuous aorta. Overlapping cardiac leads. IMPRESSION: No acute cardiopulmonary disease Electronically Signed   By: Karen Kays M.D.   On: 08/09/2022 18:16   DG FEMUR MIN 2 VIEWS LEFT  Result Date: 08/09/2022 CLINICAL DATA:  Left femur pain. EXAM: LEFT FEMUR 2 VIEWS COMPARISON:  July 30, 2022. FINDINGS: There is no evidence of fracture or other focal bone lesions. Lucency seen in left ilium on prior radiograph is not well visualized currently, and potentially may have represented overlying bowel gas. Soft tissues are unremarkable. IMPRESSION: Negative. Electronically Signed   By: Lupita Raider M.D.   On: 08/09/2022 14:55   DG Hip Unilat W OR W/O Pelvis 2-3 Views Left  Result Date: 07/30/2022 CLINICAL DATA:  Left hip pain for 1 week no injury. EXAM: DG HIP (WITH OR WITHOUT PELVIS) 2-3V LEFT COMPARISON:  December 16, 2016, December 31, 2016 FINDINGS: There is no evidence of hip fracture or dislocation. Status post prior compression screw placement of the right proximal femur. 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 1.9 cm lytic lucency in the left ilium not seen on prior exam of 2018. IMPRESSION: 1. No acute fracture or dislocation. 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 2. 1.9 cm lytic lucency in the left ilium not seen on prior exam of 2018. Nonspecific. Differential diagnosis includes but is not limited to multiple myeloma, metastatic disease. Clinical correlation is recommended. Electronically Signed   By: Sherian Rein M.D.   On: 07/30/2022 12:41    Catarina Hartshorn, DO  [facility]  If 7PM-7AM, please contact night-coverage [email or web address] Password [email or web address] 08/10/2022, 9:35 AM   LOS: 1 day

## 2022-08-10 NOTE — TOC Progression Note (Signed)
Transition of Care [facility]) - Progression Note    Patient Details  Name: Julia Choi MRN: [id number] Date of Birth: 11-19-1935  [facility] CM/SW Contact  Boneta Lucks, RN Phone Number: 08/10/2022, 11:22 AM  Clinical Narrative:   TOC following continuing workup - No PT order. Patient lives home alone. Daughter in [place] assist. Possibly weekend discharge.     Barriers to Discharge: Continued Medical Work up  Expected Discharge Plan and Services       Social Determinants of Health (SDOH) Interventions SDOH Screenings   Food Insecurity: No Food Insecurity (08/10/2022)  Housing: Low Risk  (08/10/2022)  Transportation Needs: No Transportation Needs (08/10/2022)  Utilities: Not At Risk (08/10/2022)  Alcohol Screen: Low Risk  (09/12/2021)  Depression (PHQ2-9): Medium Risk (08/09/2022)  Financial Resource Strain: Low Risk  (09/12/2021)  Physical Activity: Sufficiently Active (09/12/2021)  Social Connections: Moderately Integrated (09/12/2021)  Stress: No Stress Concern Present (09/12/2021)  Tobacco Use: Low Risk  (08/10/2022)    Readmission Risk Interventions     No data to display

## 2022-08-10 NOTE — Consult Note (Signed)
Reason for Consult: Hyponatremia Referring Physician: Tat  Julia Choi is an 87 y.o. female with past medical history significant for HTN -  on ARB/HCT combo drug, hyperlipidemia but otherwise had been pretty healthy.  Was brought to the ER yesterday with hip pain and confusion.  There is a possibly lytic lesion in the hip being worked up.  Pt was also found to have a sodium of 118-  had been 135 on 07/23/22.  Her Hyzaar was held and sodium is 120 today -  urine sodium is 37 and urine osm is pending.  I did get history from DIL-  pt has been dealing with this left hip pain for now 3 weeks-  was taking narcotics so was constipated.  Had been eating very little and was drinking LOTS of water to help with constipation.   Also this AM had urinary retention-  needed foley cath-  also likely from the pain meds.  She is in pain now.... 3 weeks ago was doing very well   Trend in Creatinine: Creatinine, Ser  Date/Time Value Ref Range Status  08/10/2022 04:15 AM 0.82 0.44 - 1.00 mg/dL Final  08/09/2022 06:11 PM 0.94 0.44 - 1.00 mg/dL Final  08/09/2022 02:00 PM 0.99 0.44 - 1.00 mg/dL Final  07/23/2022 11:23 AM 0.94 0.57 - 1.00 mg/dL Final  01/19/2022 10:51 AM 1.00 0.57 - 1.00 mg/dL Final  07/21/2021 10:17 AM 1.00 0.57 - 1.00 mg/dL Final  12/21/2016 06:15 AM 0.96 0.44 - 1.00 mg/dL Final  12/18/2016 05:16 AM 0.92 0.44 - 1.00 mg/dL Final  12/17/2016 05:02 AM 1.08 (H) 0.44 - 1.00 mg/dL Final  12/15/2016 01:04 PM 1.08 (H) 0.44 - 1.00 mg/dL Final   Sodium  Date/Time Value Ref Range Status  08/10/2022 04:15 AM 120 (L) 135 - 145 mmol/L Final  08/09/2022 06:11 PM 118 (LL) 135 - 145 mmol/L Final    Comment:    CRITICAL RESULT CALLED TO, READ BACK BY AND VERIFIED WITH: STEELE,D ON 08/09/22 AT 1915 BY LOY,C   08/09/2022 02:00 PM 118 (LL) 135 - 145 mmol/L Final    Comment:    CRITICAL RESULT CALLED TO, READ BACK BY AND VERIFIED WITH: WILSON,D ON 08/09/22 AT 1620 BY LOY,C   07/23/2022 11:23 AM 135 134 - 144  mmol/L Final  01/19/2022 10:51 AM 132 (L) 134 - 144 mmol/L Final  07/21/2021 10:17 AM 132 (L) 134 - 144 mmol/L Final  12/21/2016 06:15 AM 135 135 - 145 mmol/L Final  12/18/2016 05:16 AM 134 (L) 135 - 145 mmol/L Final  12/17/2016 05:02 AM 138 135 - 145 mmol/L Final  12/15/2016 01:04 PM 138 135 - 145 mmol/L Final   PMH:   Past Medical History:  Diagnosis Date   Hypertension    Reflux    cholesterol, hyn    PSH:   Past Surgical History:  Procedure Laterality Date   INTRAMEDULLARY (IM) NAIL INTERTROCHANTERIC Right 12/16/2016   Procedure: INTRAMEDULLARY (IM) NAIL INTERTROCHANTRIC;  Surgeon: Carole Civil, MD;  Location: [facility];  Service: Orthopedics;  Laterality: Right;   JOINT REPLACEMENT      Allergies:  Allergies  Allergen Reactions   Prednisone Palpitations    Medications:   Prior to Admission medications   Medication Sig Start Date End Date Taking? Authorizing Provider  acetaminophen (TYLENOL) 500 MG tablet Take 1,000 mg by mouth every 6 (six) hours as needed.   Yes [provider]  atorvastatin (LIPITOR) 10 MG tablet TAKE 1 TABLET BY MOUTH EVERY EVENING Patient  taking differently: Take 10 mg by mouth daily. TAKE 1 TABLET BY MOUTH EVERY EVENING 05/03/22  Yes Cook, Jayce G, DO  calcium carbonate (OS-CAL) 1250 (500 Ca) MG chewable tablet Chew 1 tablet by mouth daily.   Yes [provider]  cetirizine (ZYRTEC) 10 MG tablet Take 10 mg by mouth daily as needed for allergies.   Yes [provider]  Cholecalciferol (VITAMIN D3) 25 MCG (1000 UT) CAPS Take 1 capsule by mouth daily.   Yes [provider]  losartan-hydrochlorothiazide (HYZAAR) 100-25 MG tablet TAKE 1 TABLET BY MOUTH EVERY DAY 04/05/22  Yes Cook, Jayce G, DO  Magnesium Hydroxide (DULCOLAX SOFT CHEWS) 1200 MG CHEW Chew 1 tablet by mouth daily.   Yes [provider]  Multiple Vitamin (MULTIVITAMIN) capsule Take 1 capsule by mouth daily.   Yes [provider]   Nebivolol HCl 20 MG TABS Take one tablet po every day 08/24/21  Yes Cook, Jayce G, DO  pantoprazole (PROTONIX) 40 MG tablet TAKE 1 TABLET BY MOUTH EVERY DAY 07/18/22  Yes Cook, Jayce G, DO  traMADol (ULTRAM) 50 MG tablet Take 1 tablet (50 mg total) by mouth every 12 (twelve) hours as needed for moderate pain. 08/09/22  Yes Doreatha Massed, MD    Discontinued Meds:   Medications Discontinued During This Encounter  Medication Reason   famotidine (PEPCID) 20 MG tablet Patient Preference   fluorouracil (EFUDEX) 5 % cream Patient Preference    Social History:  reports that she has never smoked. She has never used smokeless tobacco. She reports that she does not drink alcohol and does not use drugs.  Family History:  History reviewed. No pertinent family history.  A comprehensive review of systems was negative except for: Musculoskeletal: positive for bone pain Neurological: positive for confusion  Blood pressure (!) 135/56, pulse 61, temperature 98.1 F (36.7 C), temperature source Oral, resp. rate 13, height 5\' 1"  (1.549 m), weight 73.5 kg, SpO2 98 %. General appearance: alert, appears stated age, and distracted Resp: clear to auscultation bilaterally Cardio: regular rate and rhythm, S1, S2 normal, no murmur, click, rub or gallop GI: soft, non-tender; bowel sounds normal; no masses,  no organomegaly Extremities: extremities normal, atraumatic, no cyanosis or edema and acute pain to left hip and thigh Neurologic: Mental status: conversive  but slightly confused   Labs: Basic Metabolic Panel: Recent Labs  Lab 08/09/22 1400 08/09/22 1811 08/10/22 0415  NA 118* 118* 120*  K 3.7 3.1* 2.8*  CL 82* 81* 87*  CO2 22 24 22   GLUCOSE 138* 144* 104*  BUN 21 20 18   CREATININE 0.99 0.94 0.82  ALBUMIN 4.5 4.5 4.0  CALCIUM 9.4 9.3 9.1  PHOS  --   --  3.1   Liver Function Tests: Recent Labs  Lab 08/09/22 1400 08/09/22 1811 08/10/22 0415  AST 31 27 26   ALT 26 25 24   ALKPHOS 45 42 37*   BILITOT 1.3* 1.5* 1.0  PROT 7.9 7.7 6.9  ALBUMIN 4.5 4.5 4.0   No results for input(s): "LIPASE", "AMYLASE" in the last 168 hours. No results for input(s): "AMMONIA" in the last 168 hours. CBC: Recent Labs  Lab 08/09/22 1400 08/09/22 1811 08/10/22 0415  WBC 7.3 6.7 9.3  NEUTROABS 5.3 4.5  --   HGB 12.9 12.7 12.3  HCT 35.3* 34.8* 34.2*  MCV 88.9 89.5 90.2  PLT 302 260 223   PT/INR: @labrcntip (inr:5) Cardiac Enzymes: No results for input(s): "CKTOTAL", "CKMB", "CKMBINDEX", "TROPONINI" in the last 168 hours. CBG:  No results for input(s): "GLUCAP" in the last 168 hours.  Iron Studies: No results for input(s): "IRON", "TIBC", "TRANSFERRIN", "FERRITIN" in the last 168 hours.  Xrays/Other Studies: CT Hip Left Wo Contrast  Result Date: 08/09/2022 CLINICAL DATA:  Lytic lesion. EXAM: CT OF THE LEFT HIP WITHOUT CONTRAST TECHNIQUE: Multidetector CT imaging of the left hip was performed according to the standard protocol. Multiplanar CT image reconstructions were also generated. RADIATION DOSE REDUCTION: This exam was performed according to the departmental dose-optimization program which includes automated exposure control, adjustment of the mA and/or kV according to patient size and/or use of iterative reconstruction technique. COMPARISON:  X-ray 07/30/2022. FINDINGS: Bones/Joint/Cartilage Mild concentric joint space loss of the left hip. No fracture or dislocation. There is also mild degenerative changes along the left sacroiliac joint and hypertrophic changes along the pubic symphysis. No definite lytic bone lesions identified by CT. Preserved contours of the urinary bladder. The uterus is present. The visualized bowel in the pelvis is nondilated. No developing lymph node enlargement. Scattered vascular calcifications. IMPRESSION: Degenerative changes. No specific lytic destructive process involving the iliac bone. If there is further concern a bone scan or MRI could be considered. Please see  separate dictation of the standard abdomen and pelvis CT with contrast from same day Electronically Signed   By: Jill Side M.D.   On: 08/09/2022 20:32   CT Head Wo Contrast  Result Date: 08/09/2022 CLINICAL DATA:  Mental status change, unknown cause EXAM: CT HEAD WITHOUT CONTRAST TECHNIQUE: Contiguous axial images were obtained from the base of the skull through the vertex without intravenous contrast. RADIATION DOSE REDUCTION: This exam was performed according to the departmental dose-optimization program which includes automated exposure control, adjustment of the mA and/or kV according to patient size and/or use of iterative reconstruction technique. COMPARISON:  None Available. FINDINGS: Brain: Cerebral ventricle sizes are concordant with the degree of cerebral volume loss. Patchy and confluent areas of decreased attenuation are noted throughout the deep and periventricular white matter of the cerebral hemispheres bilaterally, compatible with chronic microvascular ischemic disease. No evidence of large-territorial acute infarction. No parenchymal hemorrhage. No mass lesion. No extra-axial collection. No mass effect or midline shift. No hydrocephalus. Basilar cisterns are patent. Vascular: No hyperdense vessel. Atherosclerotic calcifications are present within the cavernous internal carotid and vertebral arteries. Skull: No acute fracture or focal lesion. Sinuses/Orbits: Sphenoid sinus mucosal thickening. Paranasal sinuses and mastoid air cells are clear. Bilateral lens replacement. Otherwise the orbits are unremarkable. Other: None. IMPRESSION: No acute intracranial abnormality. Electronically Signed   By: Iven Finn M.D.   On: 08/09/2022 20:26   CT Abdomen Pelvis W Contrast  Result Date: 08/09/2022 CLINICAL DATA:  Metastatic disease evaluation for iliac lesion. EXAM: CT ABDOMEN AND PELVIS WITH CONTRAST TECHNIQUE: Multidetector CT imaging of the abdomen and pelvis was performed using the standard  protocol following bolus administration of intravenous contrast. RADIATION DOSE REDUCTION: This exam was performed according to the departmental dose-optimization program which includes automated exposure control, adjustment of the mA and/or kV according to patient size and/or use of iterative reconstruction technique. CONTRAST:  124mL OMNIPAQUE IOHEXOL 300 MG/ML  SOLN COMPARISON:  Left femur x-ray 08/09/2022 FINDINGS: Lower chest: No acute abnormality. Hepatobiliary: No focal liver abnormality is seen. No gallstones, gallbladder wall thickening, or biliary dilatation. Pancreas: Unremarkable. No pancreatic ductal dilatation or surrounding inflammatory changes. Spleen: Normal in size without focal abnormality. Adrenals/Urinary Tract: There is a 2 cm right renal cyst and a 3.5 cm left renal cyst. There  is no hydronephrosis or perinephric fat stranding. The adrenal glands and bladder are within normal limits. Stomach/Bowel: Stomach is within normal limits. Appendix appears normal. No evidence of bowel wall thickening, distention, or inflammatory changes. Vascular/Lymphatic: Aortic atherosclerosis. No enlarged abdominal or pelvic lymph nodes. Reproductive: Uterus and bilateral adnexa are unremarkable. Other: No abdominal wall hernia or abnormality. No abdominopelvic ascites. Musculoskeletal: Right-sided hip screw present. No acute fracture or focal osseous lesion identified. Degenerative changes affect spine. IMPRESSION: 1. No acute localizing process in the abdomen or pelvis. 2. Bilateral renal cysts. No follow-up needed. Aortic Atherosclerosis (ICD10-I70.0). Electronically Signed   By: Darliss Cheney M.D.   On: 08/09/2022 20:26   DG Chest Port 1 View  Result Date: 08/09/2022 CLINICAL DATA:  Altered mental status EXAM: PORTABLE CHEST 1 VIEW COMPARISON:  12/15/2016 x-ray FINDINGS: No consolidation, pneumothorax or effusion. Normal cardiopericardial silhouette with tortuous aorta. Overlapping cardiac leads.  IMPRESSION: No acute cardiopulmonary disease Electronically Signed   By: Karen Kays M.D.   On: 08/09/2022 18:16   DG FEMUR MIN 2 VIEWS LEFT  Result Date: 08/09/2022 CLINICAL DATA:  Left femur pain. EXAM: LEFT FEMUR 2 VIEWS COMPARISON:  July 30, 2022. FINDINGS: There is no evidence of fracture or other focal bone lesions. Lucency seen in left ilium on prior radiograph is not well visualized currently, and potentially may have represented overlying bowel gas. Soft tissues are unremarkable. IMPRESSION: Negative. Electronically Signed   By: Lupita Raider M.D.   On: 08/09/2022 14:55     Assessment/Plan: 87 year old WF with acute hyponatremia in the setting of hip pain requiring narcotics-  drinking lots of water and not much food   1. Hyponatremia -  Have the knowledge of knowing that her sodium was normal on 07/23/22 at 135, then on 1/25 was 118.  She is on hyzaar but had been on that long term so I dont think that is the sole cause but agree on holding that medication.  DIL told me that pt has been drinking LOTS of water-  more that she usually would due to constipation brought on by pain pills.  In addition she was not taking in much food.  Therefore we may have a tea and toast phenomenon happening.  She is not volume overloaded or significantly dry.  I am OK with NS at 75 per hour for now-  has a stop time for this evening and I think that is appropriate.  I dont think needs a fluid restriction but DIL aware to not have her drink excessive amounts of water.  Will follow the serial labs and look at the urine osm when resulted. The other outside possibility is that she has some sort of para neoplastic reason for hyponatremia like SIADH -  urine osm will help [place] to determine that.  Her confusion could be some due to hyponatremia but seems extreme for the level-  I actually think pain meds are more contributing to her current confusion   Labs will be followed over the weekend but pt not physically seen-   call with questions    Cecille Aver 08/10/2022, 9:23 AM

## 2022-08-11 ENCOUNTER — Inpatient Hospital Stay (HOSPITAL_COMMUNITY): Payer: Medicare Other

## 2022-08-11 DIAGNOSIS — G9341 Metabolic encephalopathy: Secondary | ICD-10-CM | POA: Diagnosis not present

## 2022-08-11 DIAGNOSIS — E871 Hypo-osmolality and hyponatremia: Secondary | ICD-10-CM | POA: Diagnosis not present

## 2022-08-11 DIAGNOSIS — M898X5 Other specified disorders of bone, thigh: Secondary | ICD-10-CM | POA: Diagnosis not present

## 2022-08-11 DIAGNOSIS — E669 Obesity, unspecified: Secondary | ICD-10-CM | POA: Diagnosis not present

## 2022-08-11 LAB — BASIC METABOLIC PANEL
Anion gap: 10 (ref 5–15)
BUN: 15 mg/dL (ref 8–23)
CO2: 22 mmol/L (ref 22–32)
Calcium: 8.7 mg/dL — ABNORMAL LOW (ref 8.9–10.3)
Chloride: 95 mmol/L — ABNORMAL LOW (ref 98–111)
Creatinine, Ser: 0.77 mg/dL (ref 0.44–1.00)
GFR, Estimated: >60 mL/min (ref >60)
Glucose, Bld: 117 mg/dL — ABNORMAL HIGH (ref 70–99)
Potassium: 3.6 mmol/L (ref 3.5–5.1)
Sodium: 127 mmol/L — ABNORMAL LOW (ref 135–145)

## 2022-08-11 LAB — GLUCOSE, CAPILLARY: Glucose-Capillary: 93 mg/dL (ref 70–99)

## 2022-08-11 LAB — CANCER ANTIGEN 15-3: CA 15-3: 24.8 U/mL (ref 0.0–25.0)

## 2022-08-11 LAB — T4, FREE: Free T4: 1.53 ng/dL — ABNORMAL HIGH (ref 0.61–1.12)

## 2022-08-11 MED ORDER — SENNA 8.6 MG PO TABS
2.0000 | ORAL_TABLET | Freq: Every day | ORAL | Status: DC
  Administered 2022-08-11 – 2022-08-13 (×3): 17.2 mg via ORAL
  Filled 2022-08-11 (×3): qty 2

## 2022-08-11 MED ORDER — SODIUM CHLORIDE 0.9 % IV SOLN: INTRAVENOUS | Status: AC

## 2022-08-11 MED ORDER — POLYETHYLENE GLYCOL 3350 17 G PO PACK
17.0000 g | PACK | Freq: Every day | ORAL | Status: DC
  Administered 2022-08-11 – 2022-08-13 (×3): 17 g via ORAL
  Filled 2022-08-11 (×3): qty 1

## 2022-08-11 NOTE — Progress Notes (Signed)
Patient A&Ox4. Vital signs stable. Patient c/o pain once and PRN pain medication administered and patient reported it was effective. Patient had a visit by the daughter-in-law. Patient mobilized at highest level of mobility. Safety maintained, call bell within reach, bed in lowest position, bed alarm on and functioning without any issues. Patient endorses " I am just really frustrated that the tests are showing that there is nothing really wrong with me". Patient was comforted. Will continue to monitor and endorse.

## 2022-08-11 NOTE — Progress Notes (Signed)
PROGRESS NOTE  Julia Choi XTG:[id number] DOB: 02-Apr-1936 DOA: 08/09/2022 PCP: Coral Spikes, DO  Brief History:  87 y.o. female with medical history significant of hypertension, hyperlipidemia, GERD who presents to the ED emergency department due to about 3-week onset of left hip and thigh pain with radiation to left knee.   It has been difficult for her to lie in the bed, so she has been sleeping on a recliner in the living room with her feet up and she has been taking tramadol 50 mg daily along with extra strength Tylenol.  Patient denies any history of fall or injury.  She lives alone and was capable of doing her ADLs except that her daughter-in-law has been staying with her since the last 10 days and she was noted to have been more confused on the day of admission.  The patient had been taking tramadol for her pain at home.  Apparently she developed constipation with hydrocodone and had to take a laxative. Notably, the patient had an office visit with Dr. Delton Coombes on 08/09/2022.  She is currently being worked up for a lytic type lesion on her left ilium.  Plans are for an outpatient PET scan.  Routine labs were obtained and showed the patient had a sodium of 118.  As result, the patient was brought to the emergency department for further evaluation. In the ED, the patient was afebrile hemodynamically stable with oxygen saturation 98% on room air. Sodium 118, potassium 3.7, bicarbonate 22, serum creatinine 0.99.  WBC 7.3, hemoglobin 12.9, platelets 302.  The patient was started on normal saline.  The patient was started on time limited IVF.  Nephrology was consulted to assist with management.   Assessment/Plan:  Hyponatremia -Serum osmolarity 251 -Urine sodium 37 -urine osm -240 -suspect poor solute intake and a component of polydipsia -Nephrology consult appreciated -continue isotonic saline   Left hip lytic lesion and pain -Immunofixation was normal -Patient follows Dr.  Delton Coombes with plans for PET scan -Continue tramadol and acetaminophen -MR left hip--no acute osseous abnormality; no bone lesions; mild tendinosis of left gluteus medius and minimus tendons -check CK--75 -EVO--35   Acute metabolic Encephalopathy -CT brain neg -UA neg for pyuria -B12--755 -TSH--1.792 -folate--21.9 -likely combination of hyponatremia and opioids   Essential hypertension -Continue nebivolol   Hypokalemia -repleted -hold  HCTZ -mag 1.8   Mixed hyperlipidemia Continue Lipitor   GERD Continue Protonix   Obesity (BMI 30.8) Continue diet and lifestyle modification   Osteopenia Continue Os-Cal, vitamin D3, multivitamin   Goals of Care -confirmed with patient--DNR       Family Communication:   daughter in law updated 1/27   Consultants:  nephrology   Code Status:  FULL   DVT Prophylaxis:  [place] Lovenox     Procedures: As Listed in Progress Note Above   Antibiotics: None          Subjective: Pain seems to be improving in left hip.  Denies f/c, cp, sob, n/v/d, abd pain  Objective: Vitals:   08/11/22 1500 08/11/22 1600 08/11/22 1633 08/11/22 1700  BP: (!) 112/59 (!) 126/52  (!) 145/64  Pulse: 65 66 66 70  Resp: (!) 27 13 14 14   Temp:   98.4 F (36.9 C)   TempSrc:   Oral   SpO2: 99% 97% 99% 100%  Weight:      Height:        Intake/Output Summary (Last 24 hours) at 08/11/2022  [place] filed at 08/11/2022 1400 Gross per 24 hour  Intake 1467.59 ml  Output 2200 ml  Net -732.41 ml   Weight change: 0.764 kg Exam:  General:  Pt is alert, follows commands appropriately, not in acute distress HEENT: No icterus, No thrush, No neck mass, [place]/AT Cardiovascular: RRR, S1/S2, no rubs, no gallops Respiratory: CTA bilaterally, no wheezing, no crackles, no rhonchi Abdomen: Soft/+BS, non tender, non distended, no guarding Extremities: No edema, No lymphangitis, No petechiae, No rashes, no synovitis Neuro:  CN II-XII intact, strength 4/5  in RUE, RLE, strength 4/5 LUE, LLE; sensation intact bilateral; no dysmetria; babinski equivocal    Data Reviewed: I have personally reviewed following labs and imaging studies Basic Metabolic Panel: Recent Labs  Lab 08/10/22 0415 08/10/22 1100 08/10/22 1611 08/10/22 2307 08/11/22 0504  NA 120* 120* 121* 123* 127*  K 2.8* 3.6 3.9 3.8 3.6  CL 87* 88* 90* 92* 95*  CO2 22 21* 23 20* 22  GLUCOSE 104* 135* 123* 98 117*  BUN 18 20 21 19 15   CREATININE 0.82 1.01* 0.97 0.87 0.77  CALCIUM 9.1 8.7* 8.9 8.5* 8.7*  MG 1.8  --   --   --   --   PHOS 3.1  --   --   --   --    Liver Function Tests: Recent Labs  Lab 08/09/22 1400 08/09/22 1811 08/10/22 0415  AST 31 27 26   ALT 26 25 24   ALKPHOS 45 42 37*  BILITOT 1.3* 1.5* 1.0  PROT 7.9 7.7 6.9  ALBUMIN 4.5 4.5 4.0   No results for input(s): "LIPASE", "AMYLASE" in the last 168 hours. No results for input(s): "AMMONIA" in the last 168 hours. Coagulation Profile: No results for input(s): "INR", "PROTIME" in the last 168 hours. CBC: Recent Labs  Lab 08/09/22 1400 08/09/22 1811 08/10/22 0415  WBC 7.3 6.7 9.3  NEUTROABS 5.3 4.5  --   HGB 12.9 12.7 12.3  HCT 35.3* 34.8* 34.2*  MCV 88.9 89.5 90.2  PLT 302 260 223   Cardiac Enzymes: Recent Labs  Lab 08/10/22 1611  CKTOTAL 75   BNP: Invalid input(s): "POCBNP" CBG: No results for input(s): "GLUCAP" in the last 168 hours. HbA1C: No results for input(s): "HGBA1C" in the last 72 hours. Urine analysis:    Component Value Date/Time   COLORURINE YELLOW 08/09/2022 0050   APPEARANCEUR CLEAR 08/09/2022 0050   LABSPEC 1.019 08/09/2022 0050   PHURINE 7.0 08/09/2022 0050   GLUCOSEU NEGATIVE 08/09/2022 0050   HGBUR NEGATIVE 08/09/2022 0050   BILIRUBINUR NEGATIVE 08/09/2022 0050   KETONESUR NEGATIVE 08/09/2022 0050   PROTEINUR NEGATIVE 08/09/2022 0050   NITRITE NEGATIVE 08/09/2022 0050   LEUKOCYTESUR SMALL (A) 08/09/2022 0050   Sepsis  Labs: @LABRCNTIP (procalcitonin:4,lacticidven:4) ) Recent Results (from the past 240 hour(s))  MRSA Next Gen by PCR, Nasal     Status: None   Collection Time: 08/10/22 12:20 AM   Specimen: Nasal Mucosa; Nasal Swab  Result Value Ref Range Status   MRSA by PCR Next Gen NOT DETECTED NOT DETECTED Final    Comment: (NOTE) The GeneXpert MRSA Assay (FDA approved for NASAL specimens only), is one component of a comprehensive MRSA colonization surveillance program. It is not intended to diagnose MRSA infection nor to guide or monitor treatment for MRSA infections. Test performance is not FDA approved in patients less than 75 years old. Performed at [facility], [address], [place], [place] [address]      Scheduled Meds:  acetaminophen  650 mg Oral Q6H   atorvastatin  10 mg Oral Daily   calcium carbonate  1,250 mg Oral Daily   Chlorhexidine Gluconate Cloth  6 each Topical Q0600   cholecalciferol  1,000 Units Oral Daily   enoxaparin (LOVENOX) injection  40 mg Subcutaneous Q24H   multivitamin with minerals  1 tablet Oral Daily   nebivolol  20 mg Oral Daily   pantoprazole  40 mg Oral Daily   polyethylene glycol  17 g Oral Daily   senna  2 tablet Oral Daily   Continuous Infusions:  Procedures/Studies: MR HIP LEFT WO CONTRAST  Result Date: 08/11/2022 CLINICAL DATA:  Hip pain, chronic, impingement suspected, xray done lytic lesion left ilium. Left hip pain and left lateral thigh pain EXAM: MR OF THE LEFT HIP WITHOUT CONTRAST TECHNIQUE: Multiplanar, multisequence MR imaging was performed. No intravenous contrast was administered. COMPARISON:  CT 08/09/2022.  X-ray 07/30/2022 FINDINGS: Bones/Joint/Cartilage No acute fracture. No dislocation. Metallic susceptibility artifact related to patient's right ORIF hardware. Heterotopic ossification is noted along the superior aspect of the right greater trochanter. Mild osteoarthritis of the left hip. Trace left hip joint effusion. Degenerative blunting  of the superior labrum. No femoral head avascular necrosis. Pelvic bony ring intact without fracture or diastasis. Minimal degenerative changes of the sacroiliac joints and pubic symphysis. No bone marrow edema. No marrow replacing bone lesion. Specifically, there is no bone lesion involving the left iliac wing. Ligaments Intact. Muscles and Tendons Mild tendinosis of the left gluteus medius and minimus tendons. The hamstring, iliopsoas, rectus femoris, and adductor tendons appear intact without tear or significant tendinosis. Normal muscle bulk and signal intensity without edema, atrophy, or fatty infiltration. Soft tissues No soft tissue edema or fluid collection. No inguinal lymphadenopathy. No acute findings are seen within the pelvis. IMPRESSION: 1. No acute osseous abnormality of the left hip or pelvis. No bone lesions with attention to the left iliac wing. Previously described radiographic abnormality represented overlying bowel gas. No further follow-up imaging is needed. 2. Mild osteoarthritis of the left hip. 3. Trace left hip joint effusion is likely reactive. 4. Mild tendinosis of the left gluteus medius and minimus tendons. Electronically Signed   By: Duanne Guess D.O.   On: 08/11/2022 09:56   US Venous Img Lower Unilateral Left (DVT)  Result Date: 08/11/2022 CLINICAL DATA:  Left lower extremity pain for the past 3 weeks. Evaluate for DVT. EXAM: LEFT LOWER EXTREMITY VENOUS DOPPLER ULTRASOUND TECHNIQUE: Gray-scale sonography with graded compression, as well as color Doppler and duplex ultrasound were performed to evaluate the lower extremity deep venous systems from the level of the common femoral vein and including the common femoral, femoral, profunda femoral, popliteal and calf veins including the posterior tibial, peroneal and gastrocnemius veins when visible. The superficial great saphenous vein was also interrogated. Spectral Doppler was utilized to evaluate flow at rest and with distal  augmentation maneuvers in the common femoral, femoral and popliteal veins. COMPARISON:  None Available. FINDINGS: Contralateral Common Femoral Vein: Respiratory phasicity is normal and symmetric with the symptomatic side. No evidence of thrombus. Normal compressibility. Common Femoral Vein: No evidence of thrombus. Normal compressibility, respiratory phasicity and response to augmentation. Saphenofemoral Junction: No evidence of thrombus. Normal compressibility and flow on color Doppler imaging. Profunda Femoral Vein: No evidence of thrombus. Normal compressibility and flow on color Doppler imaging. Femoral Vein: No evidence of thrombus. Normal compressibility, respiratory phasicity and response to augmentation. Popliteal Vein: No evidence of thrombus. Normal compressibility, respiratory phasicity and response  to augmentation. Calf Veins: No evidence of thrombus. Normal compressibility and flow on color Doppler imaging. Superficial Great Saphenous Vein: No evidence of thrombus. Normal compressibility. Other Findings:  None. IMPRESSION: No evidence of DVT within the left lower extremity. Electronically Signed   By: Simonne Come M.D.   On: 08/11/2022 09:56   CT Hip Left Wo Contrast  Result Date: 08/09/2022 CLINICAL DATA:  Lytic lesion. EXAM: CT OF THE LEFT HIP WITHOUT CONTRAST TECHNIQUE: Multidetector CT imaging of the left hip was performed according to the standard protocol. Multiplanar CT image reconstructions were also generated. RADIATION DOSE REDUCTION: This exam was performed according to the departmental dose-optimization program which includes automated exposure control, adjustment of the mA and/or kV according to patient size and/or use of iterative reconstruction technique. COMPARISON:  X-ray 07/30/2022. FINDINGS: Bones/Joint/Cartilage Mild concentric joint space loss of the left hip. No fracture or dislocation. There is also mild degenerative changes along the left sacroiliac joint and hypertrophic  changes along the pubic symphysis. No definite lytic bone lesions identified by CT. Preserved contours of the urinary bladder. The uterus is present. The visualized bowel in the pelvis is nondilated. No developing lymph node enlargement. Scattered vascular calcifications. IMPRESSION: Degenerative changes. No specific lytic destructive process involving the iliac bone. If there is further concern a bone scan or MRI could be considered. Please see separate dictation of the standard abdomen and pelvis CT with contrast from same day Electronically Signed   By: Karen Kays M.D.   On: 08/09/2022 20:32   CT Head Wo Contrast  Result Date: 08/09/2022 CLINICAL DATA:  Mental status change, unknown cause EXAM: CT HEAD WITHOUT CONTRAST TECHNIQUE: Contiguous axial images were obtained from the base of the skull through the vertex without intravenous contrast. RADIATION DOSE REDUCTION: This exam was performed according to the departmental dose-optimization program which includes automated exposure control, adjustment of the mA and/or kV according to patient size and/or use of iterative reconstruction technique. COMPARISON:  None Available. FINDINGS: Brain: Cerebral ventricle sizes are concordant with the degree of cerebral volume loss. Patchy and confluent areas of decreased attenuation are noted throughout the deep and periventricular white matter of the cerebral hemispheres bilaterally, compatible with chronic microvascular ischemic disease. No evidence of large-territorial acute infarction. No parenchymal hemorrhage. No mass lesion. No extra-axial collection. No mass effect or midline shift. No hydrocephalus. Basilar cisterns are patent. Vascular: No hyperdense vessel. Atherosclerotic calcifications are present within the cavernous internal carotid and vertebral arteries. Skull: No acute fracture or focal lesion. Sinuses/Orbits: Sphenoid sinus mucosal thickening. Paranasal sinuses and mastoid air cells are clear. Bilateral  lens replacement. Otherwise the orbits are unremarkable. Other: None. IMPRESSION: No acute intracranial abnormality. Electronically Signed   By: Tish Frederickson M.D.   On: 08/09/2022 20:26   CT Abdomen Pelvis W Contrast  Result Date: 08/09/2022 CLINICAL DATA:  Metastatic disease evaluation for iliac lesion. EXAM: CT ABDOMEN AND PELVIS WITH CONTRAST TECHNIQUE: Multidetector CT imaging of the abdomen and pelvis was performed using the standard protocol following bolus administration of intravenous contrast. RADIATION DOSE REDUCTION: This exam was performed according to the departmental dose-optimization program which includes automated exposure control, adjustment of the mA and/or kV according to patient size and/or use of iterative reconstruction technique. CONTRAST:  OMNIPAQUE IOHEXOL 300 MG/ML  SOLN COMPARISON:  Left femur x-ray 08/09/2022 FINDINGS: Lower chest: No acute abnormality. Hepatobiliary: No focal liver abnormality is seen. No gallstones, gallbladder wall thickening, or biliary dilatation. Pancreas: Unremarkable. No pancreatic ductal dilatation or surrounding  inflammatory changes. Spleen: Normal in size without focal abnormality. Adrenals/Urinary Tract: There is a 2 cm right renal cyst and a 3.5 cm left renal cyst. There is no hydronephrosis or perinephric fat stranding. The adrenal glands and bladder are within normal limits. Stomach/Bowel: Stomach is within normal limits. Appendix appears normal. No evidence of bowel wall thickening, distention, or inflammatory changes. Vascular/Lymphatic: Aortic atherosclerosis. No enlarged abdominal or pelvic lymph nodes. Reproductive: Uterus and bilateral adnexa are unremarkable. Other: No abdominal wall hernia or abnormality. No abdominopelvic ascites. Musculoskeletal: Right-sided hip screw present. No acute fracture or focal osseous lesion identified. Degenerative changes affect spine. IMPRESSION: 1. No acute localizing process in the abdomen or pelvis.  2. Bilateral renal cysts. No follow-up needed. Aortic Atherosclerosis (ICD10-I70.0). Electronically Signed   By: Darliss Cheney M.D.   On: 08/09/2022 20:26   DG Chest Port 1 View  Result Date: 08/09/2022 CLINICAL DATA:  Altered mental status EXAM: PORTABLE CHEST 1 VIEW COMPARISON:  12/15/2016 x-ray FINDINGS: No consolidation, pneumothorax or effusion. Normal cardiopericardial silhouette with tortuous aorta. Overlapping cardiac leads. IMPRESSION: No acute cardiopulmonary disease Electronically Signed   By: Karen Kays M.D.   On: 08/09/2022 18:16   DG FEMUR MIN 2 VIEWS LEFT  Result Date: 08/09/2022 CLINICAL DATA:  Left femur pain. EXAM: LEFT FEMUR 2 VIEWS COMPARISON:  July 30, 2022. FINDINGS: There is no evidence of fracture or other focal bone lesions. Lucency seen in left ilium on prior radiograph is not well visualized currently, and potentially may have represented overlying bowel gas. Soft tissues are unremarkable. IMPRESSION: Negative. Electronically Signed   By: Lupita Raider M.D.   On: 08/09/2022 14:55   DG Hip Unilat W OR W/O Pelvis 2-3 Views Left  Result Date: 07/30/2022 CLINICAL DATA:  Left hip pain for 1 week no injury. EXAM: DG HIP (WITH OR WITHOUT PELVIS) 2-3V LEFT COMPARISON:  December 16, 2016, December 31, 2016 FINDINGS: There is no evidence of hip fracture or dislocation. Status post prior compression screw placement of the right proximal femur. 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 1.9 cm lytic lucency in the left ilium not seen on prior exam of 2018. IMPRESSION: 1. No acute fracture or dislocation. 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 2. 1.9 cm lytic lucency in the left ilium not seen on prior exam of 2018. Nonspecific. Differential diagnosis includes but is not limited to multiple myeloma, metastatic disease. Clinical correlation is recommended. Electronically Signed   By: Sherian Rein M.D.   On: 07/30/2022 12:41    Catarina Hartshorn, DO  [facility]  If 7PM-7AM, please contact night-coverage [email or web address] Password [facility] 08/11/2022, 5:44 PM   LOS: 2 days

## 2022-08-12 DIAGNOSIS — I10 Essential (primary) hypertension: Secondary | ICD-10-CM | POA: Diagnosis not present

## 2022-08-12 DIAGNOSIS — E669 Obesity, unspecified: Secondary | ICD-10-CM | POA: Diagnosis not present

## 2022-08-12 DIAGNOSIS — E871 Hypo-osmolality and hyponatremia: Secondary | ICD-10-CM | POA: Diagnosis not present

## 2022-08-12 DIAGNOSIS — G9341 Metabolic encephalopathy: Secondary | ICD-10-CM | POA: Diagnosis not present

## 2022-08-12 LAB — BASIC METABOLIC PANEL
Anion gap: 10 (ref 5–15)
BUN: 10 mg/dL (ref 8–23)
CO2: 21 mmol/L — ABNORMAL LOW (ref 22–32)
Calcium: 8.8 mg/dL — ABNORMAL LOW (ref 8.9–10.3)
Chloride: 96 mmol/L — ABNORMAL LOW (ref 98–111)
Creatinine, Ser: 0.67 mg/dL (ref 0.44–1.00)
GFR, Estimated: >60 mL/min (ref >60)
Glucose, Bld: 104 mg/dL — ABNORMAL HIGH (ref 70–99)
Potassium: 3.8 mmol/L (ref 3.5–5.1)
Sodium: 127 mmol/L — ABNORMAL LOW (ref 135–145)

## 2022-08-12 LAB — LIPID PANEL
Cholesterol: 110 mg/dL (ref 0–200)
HDL: 49 mg/dL (ref >40)
LDL Cholesterol: 43 mg/dL (ref 0–99)
Total CHOL/HDL Ratio: 2.2 RATIO
Triglycerides: 91 mg/dL (ref <150)
VLDL: 18 mg/dL (ref 0–40)

## 2022-08-12 LAB — CORTISOL-AM, BLOOD: Cortisol - AM: 21.9 ug/dL (ref 6.7–22.6)

## 2022-08-12 LAB — MAGNESIUM: Magnesium: 1.7 mg/dL (ref 1.7–2.4)

## 2022-08-12 NOTE — Progress Notes (Signed)
PROGRESS NOTE  [name] IOX:[id number] DOB: [birth year] DOA: 08/09/2022 PCP: Coral Spikes, DO  Brief History:  87 y.o. female with medical history significant of hypertension, hyperlipidemia, GERD who presents to the ED emergency department due to about 3-week onset of left hip and thigh pain with radiation to left knee.   It has been difficult for her to lie in the bed, so she has been sleeping on a recliner in the living room with her feet up and she has been taking tramadol 50 mg daily along with extra strength Tylenol.  Patient denies any history of fall or injury.  She lives alone and was capable of doing her ADLs except that her daughter-in-law has been staying with her since the last 10 days and she was noted to have been more confused on the day of admission.  The patient had been taking tramadol for her pain at home.  Apparently she developed constipation with hydrocodone and had to take a laxative. Notably, the patient had an office visit with Dr. Delton Coombes on 08/09/2022.  She is currently being worked up for a lytic type lesion on her left ilium.  Plans are for an outpatient PET scan.  Routine labs were obtained and showed the patient had a sodium of 118.  As result, the patient was brought to the emergency department for further evaluation. In the ED, the patient was afebrile hemodynamically stable with oxygen saturation 98% on room air. Sodium 118, potassium 3.7, bicarbonate 22, serum creatinine 0.99.  WBC 7.3, hemoglobin 12.9, platelets 302.  The patient was started on normal saline.  The patient was started on time limited IVF.  Nephrology was consulted to assist with management.   Assessment/Plan:  Hyponatremia -Serum osmolarity 251 -Urine sodium 37 -urine osm -240 -suspect poor solute intake and a component of polydipsia -Nephrology consult appreciated -continue isotonic saline   Left hip lytic lesion and pain -Immunofixation was normal -Patient follows Dr.  Delton Coombes with plans for PET scan -Continue tramadol and acetaminophen -MR left hip--no acute osseous abnormality; no bone lesions; mild tendinosis of left gluteus medius and minimus tendons -check CK--75 -ESR--16 -pt has only required one dose tramadol in last 24 hours   Acute metabolic Encephalopathy -CT brain neg -UA neg for pyuria -B12--755 -TSH--1.792 -folate--21.9 -likely combination of hyponatremia and opioids -overall improving   Essential hypertension -Continue nebivolol   Hypokalemia -repleted -hold  HCTZ -mag 1.8   Mixed hyperlipidemia Continue Lipitor   GERD Continue Protonix   Obesity (BMI 30.8) Continue diet and lifestyle modification   Osteopenia Continue Os-Cal, vitamin D3, multivitamin   Goals of Care -confirmed with patient--DNR       Family Communication:    son and daughter in law updated 1/29   Consultants:  nephrology   Code Status:  FULL   DVT Prophylaxis:  [place] Lovenox     Procedures: As Listed in Progress Note Above   Antibiotics: None        Subjective: Patient denies fevers, chills, headache, chest pain, dyspnea, nausea, vomiting, diarrhea, abdominal pain, dysuria, hematuria, hematochezia, and melena.   Objective: Vitals:   08/11/22 2029 08/11/22 2100 08/12/22 0030 08/12/22 0500  BP: (!) 148/68  (!) 152/65 (!) 144/66  Pulse: 72  76 68  Resp: 17  18 20   Temp: 98.6 F (37 C)  98.6 F (37 C) 97.8 F (36.6 C)  TempSrc: Oral  Oral Oral  SpO2: 100%  100% 100%  Weight:  69.3 kg    Height:        Intake/Output Summary (Last 24 hours) at 08/12/2022 1634 Last data filed at 08/12/2022 0500 Gross per 24 hour  Intake 480 ml  Output 1200 ml  Net -720 ml   Weight change: -5.4 kg Exam:  General:  Pt is alert, follows commands appropriately, not in acute distress HEENT: No icterus, No thrush, No neck mass, [place]/AT Cardiovascular: RRR, S1/S2, no rubs, no gallops Respiratory: CTA bilaterally, no wheezing, no crackles, no  rhonchi Abdomen: Soft/+BS, non tender, non distended, no guarding Extremities: No edema, No lymphangitis, No petechiae, No rashes, no synovitis   Data Reviewed: I have personally reviewed following labs and imaging studies Basic Metabolic Panel: Recent Labs  Lab 08/10/22 0415 08/10/22 1100 08/10/22 1611 08/10/22 2307 08/11/22 0504 08/12/22 0452  NA 120* 120* 121* 123* 127* 127*  K 2.8* 3.6 3.9 3.8 3.6 3.8  CL 87* 88* 90* 92* 95* 96*  CO2 22 21* 23 20* 22 21*  GLUCOSE 104* 135* 123* 98 117* 104*  BUN 18 20 21 19 15 10   CREATININE 0.82 1.01* 0.97 0.87 0.77 0.67  CALCIUM 9.1 8.7* 8.9 8.5* 8.7* 8.8*  MG 1.8  --   --   --   --  1.7  PHOS 3.1  --   --   --   --   --    Liver Function Tests: Recent Labs  Lab 08/09/22 1400 08/09/22 1811 08/10/22 0415  AST 31 27 26   ALT 26 25 24   ALKPHOS 45 42 37*  BILITOT 1.3* 1.5* 1.0  PROT 7.9 7.7 6.9  ALBUMIN 4.5 4.5 4.0   No results for input(s): "LIPASE", "AMYLASE" in the last 168 hours. No results for input(s): "AMMONIA" in the last 168 hours. Coagulation Profile: No results for input(s): "INR", "PROTIME" in the last 168 hours. CBC: Recent Labs  Lab 08/09/22 1400 08/09/22 1811 08/10/22 0415  WBC 7.3 6.7 9.3  NEUTROABS 5.3 4.5  --   HGB 12.9 12.7 12.3  HCT 35.3* 34.8* 34.2*  MCV 88.9 89.5 90.2  PLT 302 260 223   Cardiac Enzymes: Recent Labs  Lab 08/10/22 1611  CKTOTAL 75   BNP: Invalid input(s): "POCBNP" CBG: Recent Labs  Lab 08/11/22 2031  GLUCAP 93   HbA1C: No results for input(s): "HGBA1C" in the last 72 hours. Urine analysis:    Component Value Date/Time   COLORURINE YELLOW 08/09/2022 0050   APPEARANCEUR CLEAR 08/09/2022 0050   LABSPEC 1.019 08/09/2022 0050   PHURINE 7.0 08/09/2022 0050   GLUCOSEU NEGATIVE 08/09/2022 0050   HGBUR NEGATIVE 08/09/2022 0050   BILIRUBINUR NEGATIVE 08/09/2022 0050   KETONESUR NEGATIVE 08/09/2022 0050   PROTEINUR NEGATIVE 08/09/2022 0050   NITRITE NEGATIVE 08/09/2022  0050   LEUKOCYTESUR SMALL (A) 08/09/2022 0050   Sepsis Labs: @LABRCNTIP (procalcitonin:4,lacticidven:4) ) Recent Results (from the past 240 hour(s))  MRSA Next Gen by PCR, Nasal     Status: None   Collection Time: 08/10/22 12:20 AM   Specimen: Nasal Mucosa; Nasal Swab  Result Value Ref Range Status   MRSA by PCR Next Gen NOT DETECTED NOT DETECTED Final    Comment: (NOTE) The GeneXpert MRSA Assay (FDA approved for NASAL specimens only), is one component of a comprehensive MRSA colonization surveillance program. It is not intended to diagnose MRSA infection nor to guide or monitor treatment for MRSA infections. Test performance is not FDA approved in patients less than 29 years old. Performed at [facility],  [address], [place], [place] [address]      Scheduled Meds:  acetaminophen  650 mg Oral Q6H   atorvastatin  10 mg Oral Daily   calcium carbonate  1,250 mg Oral Daily   Chlorhexidine Gluconate Cloth  6 each Topical Q0600   cholecalciferol  1,000 Units Oral Daily   enoxaparin (LOVENOX) injection  40 mg Subcutaneous Q24H   multivitamin with minerals  1 tablet Oral Daily   nebivolol  20 mg Oral Daily   pantoprazole  40 mg Oral Daily   polyethylene glycol  17 g Oral Daily   senna  2 tablet Oral Daily   Continuous Infusions:  Procedures/Studies: MR HIP LEFT WO CONTRAST  Result Date: 08/11/2022 CLINICAL DATA:  Hip pain, chronic, impingement suspected, xray done lytic lesion left ilium. Left hip pain and left lateral thigh pain EXAM: MR OF THE LEFT HIP WITHOUT CONTRAST TECHNIQUE: Multiplanar, multisequence MR imaging was performed. No intravenous contrast was administered. COMPARISON:  CT 08/09/2022.  X-ray 07/30/2022 FINDINGS: Bones/Joint/Cartilage No acute fracture. No dislocation. Metallic susceptibility artifact related to patient's right ORIF hardware. Heterotopic ossification is noted along the superior aspect of the right greater trochanter. Mild osteoarthritis of the left  hip. Trace left hip joint effusion. Degenerative blunting of the superior labrum. No femoral head avascular necrosis. Pelvic bony ring intact without fracture or diastasis. Minimal degenerative changes of the sacroiliac joints and pubic symphysis. No bone marrow edema. No marrow replacing bone lesion. Specifically, there is no bone lesion involving the left iliac wing. Ligaments Intact. Muscles and Tendons Mild tendinosis of the left gluteus medius and minimus tendons. The hamstring, iliopsoas, rectus femoris, and adductor tendons appear intact without tear or significant tendinosis. Normal muscle bulk and signal intensity without edema, atrophy, or fatty infiltration. Soft tissues No soft tissue edema or fluid collection. No inguinal lymphadenopathy. No acute findings are seen within the pelvis. IMPRESSION: 1. No acute osseous abnormality of the left hip or pelvis. No bone lesions with attention to the left iliac wing. Previously described radiographic abnormality represented overlying bowel gas. No further follow-up imaging is needed. 2. Mild osteoarthritis of the left hip. 3. Trace left hip joint effusion is likely reactive. 4. Mild tendinosis of the left gluteus medius and minimus tendons. Electronically Signed   By: Duanne Guess D.O.   On: 08/11/2022 09:56   US Venous Img Lower Unilateral Left (DVT)  Result Date: 08/11/2022 CLINICAL DATA:  Left lower extremity pain for the past 3 weeks. Evaluate for DVT. EXAM: LEFT LOWER EXTREMITY VENOUS DOPPLER ULTRASOUND TECHNIQUE: Gray-scale sonography with graded compression, as well as color Doppler and duplex ultrasound were performed to evaluate the lower extremity deep venous systems from the level of the common femoral vein and including the common femoral, femoral, profunda femoral, popliteal and calf veins including the posterior tibial, peroneal and gastrocnemius veins when visible. The superficial great saphenous vein was also interrogated. Spectral Doppler  was utilized to evaluate flow at rest and with distal augmentation maneuvers in the common femoral, femoral and popliteal veins. COMPARISON:  None Available. FINDINGS: Contralateral Common Femoral Vein: Respiratory phasicity is normal and symmetric with the symptomatic side. No evidence of thrombus. Normal compressibility. Common Femoral Vein: No evidence of thrombus. Normal compressibility, respiratory phasicity and response to augmentation. Saphenofemoral Junction: No evidence of thrombus. Normal compressibility and flow on color Doppler imaging. Profunda Femoral Vein: No evidence of thrombus. Normal compressibility and flow on color Doppler imaging. Femoral Vein: No evidence of thrombus. Normal compressibility, respiratory phasicity  and response to augmentation. Popliteal Vein: No evidence of thrombus. Normal compressibility, respiratory phasicity and response to augmentation. Calf Veins: No evidence of thrombus. Normal compressibility and flow on color Doppler imaging. Superficial Great Saphenous Vein: No evidence of thrombus. Normal compressibility. Other Findings:  None. IMPRESSION: No evidence of DVT within the left lower extremity. Electronically Signed   By: Simonne Come M.D.   On: 08/11/2022 09:56   CT Hip Left Wo Contrast  Result Date: 08/09/2022 CLINICAL DATA:  Lytic lesion. EXAM: CT OF THE LEFT HIP WITHOUT CONTRAST TECHNIQUE: Multidetector CT imaging of the left hip was performed according to the standard protocol. Multiplanar CT image reconstructions were also generated. RADIATION DOSE REDUCTION: This exam was performed according to the departmental dose-optimization program which includes automated exposure control, adjustment of the mA and/or kV according to patient size and/or use of iterative reconstruction technique. COMPARISON:  X-ray 07/30/2022. FINDINGS: Bones/Joint/Cartilage Mild concentric joint space loss of the left hip. No fracture or dislocation. There is also mild degenerative changes  along the left sacroiliac joint and hypertrophic changes along the pubic symphysis. No definite lytic bone lesions identified by CT. Preserved contours of the urinary bladder. The uterus is present. The visualized bowel in the pelvis is nondilated. No developing lymph node enlargement. Scattered vascular calcifications. IMPRESSION: Degenerative changes. No specific lytic destructive process involving the iliac bone. If there is further concern a bone scan or MRI could be considered. Please see separate dictation of the standard abdomen and pelvis CT with contrast from same day Electronically Signed   By: Karen Kays M.D.   On: 08/09/2022 20:32   CT Head Wo Contrast  Result Date: 08/09/2022 CLINICAL DATA:  Mental status change, unknown cause EXAM: CT HEAD WITHOUT CONTRAST TECHNIQUE: Contiguous axial images were obtained from the base of the skull through the vertex without intravenous contrast. RADIATION DOSE REDUCTION: This exam was performed according to the departmental dose-optimization program which includes automated exposure control, adjustment of the mA and/or kV according to patient size and/or use of iterative reconstruction technique. COMPARISON:  None Available. FINDINGS: Brain: Cerebral ventricle sizes are concordant with the degree of cerebral volume loss. Patchy and confluent areas of decreased attenuation are noted throughout the deep and periventricular white matter of the cerebral hemispheres bilaterally, compatible with chronic microvascular ischemic disease. No evidence of large-territorial acute infarction. No parenchymal hemorrhage. No mass lesion. No extra-axial collection. No mass effect or midline shift. No hydrocephalus. Basilar cisterns are patent. Vascular: No hyperdense vessel. Atherosclerotic calcifications are present within the cavernous internal carotid and vertebral arteries. Skull: No acute fracture or focal lesion. Sinuses/Orbits: Sphenoid sinus mucosal thickening. Paranasal  sinuses and mastoid air cells are clear. Bilateral lens replacement. Otherwise the orbits are unremarkable. Other: None. IMPRESSION: No acute intracranial abnormality. Electronically Signed   By: Tish Frederickson M.D.   On: 08/09/2022 20:26   CT Abdomen Pelvis W Contrast  Result Date: 08/09/2022 CLINICAL DATA:  Metastatic disease evaluation for iliac lesion. EXAM: CT ABDOMEN AND PELVIS WITH CONTRAST TECHNIQUE: Multidetector CT imaging of the abdomen and pelvis was performed using the standard protocol following bolus administration of intravenous contrast. RADIATION DOSE REDUCTION: This exam was performed according to the departmental dose-optimization program which includes automated exposure control, adjustment of the mA and/or kV according to patient size and/or use of iterative reconstruction technique. CONTRAST:  OMNIPAQUE IOHEXOL 300 MG/ML  SOLN COMPARISON:  Left femur x-ray 08/09/2022 FINDINGS: Lower chest: No acute abnormality. Hepatobiliary: No focal liver abnormality is seen.  No gallstones, gallbladder wall thickening, or biliary dilatation. Pancreas: Unremarkable. No pancreatic ductal dilatation or surrounding inflammatory changes. Spleen: Normal in size without focal abnormality. Adrenals/Urinary Tract: There is a 2 cm right renal cyst and a 3.5 cm left renal cyst. There is no hydronephrosis or perinephric fat stranding. The adrenal glands and bladder are within normal limits. Stomach/Bowel: Stomach is within normal limits. Appendix appears normal. No evidence of bowel wall thickening, distention, or inflammatory changes. Vascular/Lymphatic: Aortic atherosclerosis. No enlarged abdominal or pelvic lymph nodes. Reproductive: Uterus and bilateral adnexa are unremarkable. Other: No abdominal wall hernia or abnormality. No abdominopelvic ascites. Musculoskeletal: Right-sided hip screw present. No acute fracture or focal osseous lesion identified. Degenerative changes affect spine. IMPRESSION: 1. No  acute localizing process in the abdomen or pelvis. 2. Bilateral renal cysts. No follow-up needed. Aortic Atherosclerosis (ICD10-I70.0). Electronically Signed   By: Darliss Cheney M.D.   On: 08/09/2022 20:26   DG Chest Port 1 View  Result Date: 08/09/2022 CLINICAL DATA:  Altered mental status EXAM: PORTABLE CHEST 1 VIEW COMPARISON:  12/15/2016 x-ray FINDINGS: No consolidation, pneumothorax or effusion. Normal cardiopericardial silhouette with tortuous aorta. Overlapping cardiac leads. IMPRESSION: No acute cardiopulmonary disease Electronically Signed   By: Karen Kays M.D.   On: 08/09/2022 18:16   DG FEMUR MIN 2 VIEWS LEFT  Result Date: 08/09/2022 CLINICAL DATA:  Left femur pain. EXAM: LEFT FEMUR 2 VIEWS COMPARISON:  July 30, 2022. FINDINGS: There is no evidence of fracture or other focal bone lesions. Lucency seen in left ilium on prior radiograph is not well visualized currently, and potentially may have represented overlying bowel gas. Soft tissues are unremarkable. IMPRESSION: Negative. Electronically Signed   By: Lupita Raider M.D.   On: 08/09/2022 14:55   DG Hip Unilat W OR W/O Pelvis 2-3 Views Left  Result Date: 07/30/2022 CLINICAL DATA:  Left hip pain for 1 week no injury. EXAM: DG HIP (WITH OR WITHOUT PELVIS) 2-3V LEFT COMPARISON:  December 16, 2016, December 31, 2016 FINDINGS: There is no evidence of hip fracture or dislocation. Status post prior compression screw placement of the right proximal femur. 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 1.9 cm lytic lucency in the left ilium not seen on prior exam of 2018. IMPRESSION: 1. No acute fracture or dislocation. 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 2. 1.9 cm lytic lucency in the left ilium not seen on prior exam of 2018. Nonspecific. Differential diagnosis includes but is not limited to multiple myeloma, metastatic disease. Clinical correlation is recommended. Electronically Signed   By:  Sherian Rein M.D.   On: 07/30/2022 12:41    Julia Hartshorn, DO  [facility]  If 7PM-7AM, please contact night-coverage [email or web address] Password [facility] 08/12/2022, 4:34 PM   LOS: 3 days

## 2022-08-12 NOTE — Plan of Care (Signed)
  Problem: Acute Rehab PT Goals(only PT should resolve) Goal: Pt Will Go Supine/Side To Sit Flowsheets (Taken 08/12/2022 1557) Pt will go Supine/Side to Sit: with modified independence Goal: Patient Will Transfer Sit To/From Stand Flowsheets (Taken 08/12/2022 1557) Patient will transfer sit to/from stand: with modified independence Goal: Pt Will Ambulate Flowsheets (Taken 08/12/2022 1557) Pt will Ambulate:  100 feet  with modified independence  with least restrictive assistive device Goal: Pt Will Go Up/Down Stairs Flowsheets (Taken 08/12/2022 1557) Pt will Go Up / Down Stairs:  3-5 stairs  with moderate assist  with modified independence  3:58 PM, 08/12/22 Josue Hector PT DPT  Physical Therapist with [facility]  [phone]

## 2022-08-12 NOTE — Evaluation (Signed)
Physical Therapy Evaluation Patient Details Name: Julia Choi MRN: [id number] DOB: 04-12-1936 Today's Date: 08/12/2022  History of Present Illness  Julia Choi is a 87 y.o. female with medical history significant of hypertension, hyperlipidemia, GERD who presents to the ED emergency department due to about 3-week onset of left hip and thigh pain with radiation to left knee, pain has been progressively worse and this was since on walking.  It has been difficult for her to lie in the bed, so she has been sleeping on a recliner in the living room with her feet up and she has been taking tramadol 50 mg daily along with extra strength Tylenol.  Patient denies any history of fall or injury.  She lives alone and was capable of doing her ADLs except that her daughter-in-law has been staying with her since the last 10 days and she was noted to have been more confused today.  Patient was seen on consult by Dr. Ellin Saba in the afternoon, other lab work done showed hyponatremia and she was called and asked to go to the ED for further evaluation and management.   Clinical Impression  Patient presents seated in recliner, with family members present in room. Patient is awake, alert and cooperative. Patient reports no pain at rest but does voice that she has been having increased LLE pain with WB activity. She is apprehensive about WB activity because of this. Patient was able to stand and ambulate using RW with min guard assist. Walked to bathroom with assist. Able to ambulate in hallway with min Guard assist and RW. Patient does note increased fatigue and LLE pain. Returned to sitting in recliner. Patient states PLOF as ambulating without AD, though shows improved stability using RW today. Encouraged patient to continue using AD at this time, for improved stability until LLE issues are addressed. Recommend SNF due to patient lives alone and does not have access to 24 hour assist, and is limited by LE weakness and  leg pain which is affecting gait and balance at this time. Patient left in recliner with phone and call bell in reach, family members present in room. Patient will benefit from continued physical therapy in hospital and recommended venue below to increase strength, balance, endurance for safe ADLs and gait.         Recommendations for follow up therapy are one component of a multi-disciplinary discharge planning process, led by the attending physician.  Recommendations may be updated based on patient status, additional functional criteria and insurance authorization.  Follow Up Recommendations Skilled nursing-short term rehab (<3 hours/day) Can patient physically be transported by private vehicle: Yes    Assistance Recommended at Discharge Intermittent Supervision/Assistance  Patient can return home with the following  A little help with walking and/or transfers;A little help with bathing/dressing/bathroom;Help with stairs or ramp for entrance    Equipment Recommendations None recommended by PT  Recommendations for Other Services       Functional Status Assessment Patient has had a recent decline in their functional status and demonstrates the ability to make significant improvements in function in a reasonable and predictable amount of time.     Precautions / Restrictions Precautions Precautions: Fall Restrictions Weight Bearing Restrictions: No      Mobility  Bed Mobility Overal bed mobility: Needs Assistance             General bed mobility comments: Patient has not been staying in bed due to hip/ leg pain    Transfers Overall transfer  level: Needs assistance Equipment used: Rolling walker (2 wheels) Transfers: Sit to/from Stand Sit to Stand: Min guard           General transfer comment: labored, hesitant, need cues for hand placement    Ambulation/Gait Ambulation/Gait assistance: Min guard Gait Distance (Feet): 60 Feet Assistive device: Rolling walker (2  wheels) Gait Pattern/deviations: Decreased stride length, Trunk flexed Gait velocity: slow     General Gait Details: slow, labored, increased LLE pain with longer distance  Stairs            Wheelchair Mobility    Modified Rankin (Stroke Patients Only)       Balance Overall balance assessment: Needs assistance Sitting-balance support: Feet supported, No upper extremity supported Sitting balance-Leahy Scale: Fair Sitting balance - Comments: at edge of recliner   Standing balance support: Bilateral upper extremity supported Standing balance-Leahy Scale: Fair Standing balance comment: Standing from recliner                             Pertinent Vitals/Pain Pain Assessment Pain Assessment: No/denies pain    Home Living Family/patient expects to be discharged to:: Private residence Living Arrangements: Alone Available Help at Discharge: Neighbor;Available PRN/intermittently Type of Home: House Home Access: Stairs to enter Entrance Stairs-Rails: Right;Can reach both (rail on RT first 3 steps, rail on both sides 2nd set of 3 steps) Entrance Stairs-Number of Steps: 6 Alternate Level Stairs-Number of Steps: 14 Home Layout: Two level;Able to live on main level with bedroom/bathroom Home Equipment: Toilet riser;Cane - single point;Rolling Walker (2 wheels)      Prior Function Prior Level of Function : Independent/Modified Independent                     Hand Dominance        Extremity/Trunk Assessment   Upper Extremity Assessment Upper Extremity Assessment: Defer to OT evaluation    Lower Extremity Assessment Lower Extremity Assessment: Generalized weakness    Cervical / Trunk Assessment Cervical / Trunk Assessment: Normal  Communication   Communication: No difficulties  Cognition Arousal/Alertness: Awake/alert Behavior During Therapy: WFL for tasks assessed/performed Overall Cognitive Status: Within Functional Limits for tasks  assessed                                          General Comments      Exercises     Assessment/Plan    PT Assessment Patient needs continued PT services  PT Problem List Decreased strength;Decreased mobility;Decreased activity tolerance;Decreased balance;Pain       PT Treatment Interventions DME instruction;Gait training;Stair training;Balance training;Functional mobility training;Neuromuscular re-education;Patient/family education;Therapeutic exercise;Therapeutic activities    PT Goals (Current goals can be found in the Care Plan section)  Acute Rehab PT Goals Patient Stated Goal: Return home PT Goal Formulation: With patient/family Time For Goal Achievement: 08/26/22 Potential to Achieve Goals: Good    Frequency Min 2X/week     Co-evaluation               AM-PAC PT "6 Clicks" Mobility  Outcome Measure Help needed turning from your back to your side while in a flat bed without using bedrails?: A Little Help needed moving from lying on your back to sitting on the side of a flat bed without using bedrails?: A Little Help needed moving to and from a bed  to a chair (including a wheelchair)?: A Little Help needed standing up from a chair using your arms (e.g., wheelchair or bedside chair)?: A Little Help needed to walk in hospital room?: A Little Help needed climbing 3-5 steps with a railing? : A Lot 6 Click Score: 17    End of Session Equipment Utilized During Treatment: Gait belt Activity Tolerance: Patient limited by fatigue;Patient limited by pain Patient left: in chair;with call bell/phone within reach;with family/visitor present Nurse Communication: Mobility status PT Visit Diagnosis: Other abnormalities of gait and mobility (R26.89);Muscle weakness (generalized) (M62.81);Difficulty in walking, not elsewhere classified (R26.2)    Time: 9381-8299 PT Time Calculation (min) (ACUTE ONLY): 35 min   Charges:   PT Evaluation $PT Eval Low  Complexity: 1 Low PT Treatments $Therapeutic Activity: 8-22 mins      3:56 PM, 08/12/22 Josue Hector PT DPT  Physical Therapist with [facility]  [phone]

## 2022-08-13 ENCOUNTER — Telehealth: Payer: Self-pay | Admitting: *Deleted

## 2022-08-13 DIAGNOSIS — E669 Obesity, unspecified: Secondary | ICD-10-CM | POA: Diagnosis not present

## 2022-08-13 DIAGNOSIS — I10 Essential (primary) hypertension: Secondary | ICD-10-CM | POA: Diagnosis not present

## 2022-08-13 DIAGNOSIS — G9341 Metabolic encephalopathy: Secondary | ICD-10-CM | POA: Diagnosis not present

## 2022-08-13 DIAGNOSIS — E871 Hypo-osmolality and hyponatremia: Secondary | ICD-10-CM | POA: Diagnosis not present

## 2022-08-13 LAB — BASIC METABOLIC PANEL
Anion gap: 11 (ref 5–15)
BUN: 8 mg/dL (ref 8–23)
CO2: 22 mmol/L (ref 22–32)
Calcium: 9 mg/dL (ref 8.9–10.3)
Chloride: 94 mmol/L — ABNORMAL LOW (ref 98–111)
Creatinine, Ser: 0.77 mg/dL (ref 0.44–1.00)
GFR, Estimated: >60 mL/min (ref >60)
Glucose, Bld: 108 mg/dL — ABNORMAL HIGH (ref 70–99)
Potassium: 3.6 mmol/L (ref 3.5–5.1)
Sodium: 127 mmol/L — ABNORMAL LOW (ref 135–145)

## 2022-08-13 LAB — RESP PANEL BY RT-PCR (RSV, FLU A&B, COVID)  RVPGX2
Influenza A by PCR: NEGATIVE
Influenza B by PCR: NEGATIVE
Resp Syncytial Virus by PCR: NEGATIVE
SARS Coronavirus 2 by RT PCR: NEGATIVE

## 2022-08-13 LAB — MAGNESIUM: Magnesium: 1.6 mg/dL — ABNORMAL LOW (ref 1.7–2.4)

## 2022-08-13 MED ORDER — MAGNESIUM SULFATE 2 GM/50ML IV SOLN
2.0000 g | Freq: Once | INTRAVENOUS | Status: AC
  Administered 2022-08-13: 2 g via INTRAVENOUS
  Filled 2022-08-13: qty 50

## 2022-08-13 MED ORDER — TRAMADOL HCL 50 MG PO TABS: 50.0000 mg | ORAL_TABLET | Freq: Two times a day (BID) | ORAL | 0 refills | Status: DC | PRN

## 2022-08-13 MED ORDER — FUROSEMIDE 40 MG PO TABS
20.0000 mg | ORAL_TABLET | Freq: Every day | ORAL | Status: DC
  Administered 2022-08-13: 20 mg via ORAL
  Filled 2022-08-13: qty 1

## 2022-08-13 MED ORDER — POLYETHYLENE GLYCOL 3350 17 G PO PACK: 17.0000 g | PACK | Freq: Every day | ORAL | 0 refills | Status: AC

## 2022-08-13 MED ORDER — ACETAMINOPHEN 325 MG PO TABS: 650.0000 mg | ORAL_TABLET | Freq: Four times a day (QID) | ORAL | Status: AC

## 2022-08-13 MED ORDER — BOOST / RESOURCE BREEZE PO LIQD CUSTOM
1.0000 | Freq: Two times a day (BID) | ORAL | Status: DC
  Administered 2022-08-13 (×2): 1 via ORAL

## 2022-08-13 MED ORDER — FUROSEMIDE 20 MG PO TABS: 20.0000 mg | ORAL_TABLET | Freq: Every day | ORAL | Status: DC

## 2022-08-13 MED ORDER — SENNA 8.6 MG PO TABS: 2.0000 | ORAL_TABLET | Freq: Every day | ORAL | 0 refills | Status: AC

## 2022-08-13 NOTE — NC FL2 (Signed)
[place] MEDICAID FL2 LEVEL OF CARE FORM     IDENTIFICATION  Patient Name: Julia Choi Birthdate: 1936/06/22 Sex: female Admission Date (Current Location): 08/09/2022  [facility] and [place] Number:  [employer] and Address:  [facility] [address] [place]      Provider Number: [id number]  Attending Physician Name and Address:  Orson Eva, MD  Relative Name and Phone Number:       Current Level of Care: Hospital Recommended Level of Care: [place] Prior Approval Number:    Date Approved/Denied:   PASRR Number: [id number]  Discharge Plan: SNF    Current Diagnoses: Patient Active Problem List   Diagnosis Date Noted   Acute metabolic encephalopathy 53/29/9242   Lytic bone lesion of hip 08/09/2022   Hyponatremia 08/09/2022   Obesity (BMI 30-39.9) 08/09/2022   Left hip pain 07/30/2022   Type 2 diabetes mellitus ([place]) 01/19/2022   Insomnia 01/19/2022   Osteopenia 68/34/1962   Systolic murmur 22/97/9892   GERD (gastroesophageal reflux disease) 12/28/2020   Essential hypertension 12/15/2016   Mixed hyperlipidemia 12/15/2016    Orientation RESPIRATION BLADDER Height & Weight     Self, Time, Situation, Place  Normal Continent Weight: 152 lb 12.5 oz (69.3 kg) Height:  5\' 1"  (154.9 cm)  BEHAVIORAL SYMPTOMS/MOOD NEUROLOGICAL BOWEL NUTRITION STATUS      Continent Diet (See D/C summary)  AMBULATORY STATUS COMMUNICATION OF NEEDS Skin   Extensive Assist Verbally Normal                       Personal Care Assistance Level of Assistance  Bathing, Feeding, Dressing Bathing Assistance: Limited assistance Feeding assistance: Independent Dressing Assistance: Limited assistance     Functional Limitations Info  Sight, Hearing, Speech Sight Info: Adequate Hearing Info: Adequate Speech Info: Adequate    SPECIAL CARE FACTORS FREQUENCY  PT (By licensed PT), OT (By licensed OT)     PT Frequency: 5 times  weekly OT Frequency: 5 times weekly            Contractures Contractures Info: Not present    Additional Factors Info  Code Status, Allergies Code Status Info: DNR Allergies Info: Prednisone           Current Medications (08/13/2022):  This is the current hospital active medication list Current Facility-Administered Medications  Medication Dose Route Frequency Provider Last Rate Last Admin   acetaminophen (TYLENOL) tablet 650 mg  650 mg Oral Q6H PRN Tat, Shanon Brow, MD   650 mg at 08/10/22 1947   Or   acetaminophen (TYLENOL) suppository 650 mg  650 mg Rectal Q6H PRN Tat, Shanon Brow, MD       acetaminophen (TYLENOL) tablet 650 mg  650 mg Oral Q6H Orson Eva, MD   650 mg at 08/12/22 2037   atorvastatin (LIPITOR) tablet 10 mg  10 mg Oral Daily Tat, David, MD   10 mg at 08/13/22 1194   calcium carbonate (OS-CAL - dosed in mg of elemental calcium) tablet 1,250 mg  1,250 mg Oral Daily Tat, Shanon Brow, MD   1,250 mg at 08/13/22 1740   Chlorhexidine Gluconate Cloth 2 % PADS 6 each  6 each Topical C1448 Orson Eva, MD   6 each at 08/12/22 0813   cholecalciferol (VITAMIN D3) 25 MCG (1000 UNIT) tablet 1,000 Units  1,000 Units Oral Daily Tat, David, MD   1,000 Units at 08/13/22 0925   enoxaparin (LOVENOX) injection 40 mg  40 mg Subcutaneous  Q24H Orson Eva, MD   40 mg at 08/13/22 3007   feeding supplement (BOOST / RESOURCE BREEZE) liquid 1 Container  1 Container Oral BID BM Elmarie Shiley, MD   1 Container at 08/13/22 [phone]   furosemide (LASIX) tablet 20 mg  20 mg Oral Daily Elmarie Shiley, MD       magnesium sulfate IVPB 2 g 50 mL  2 g Intravenous Once Tat, David, MD       multivitamin with minerals tablet 1 tablet  1 tablet Oral Daily Tat, David, MD   1 tablet at 08/13/22 0924   nebivolol (BYSTOLIC) tablet 20 mg  20 mg Oral Daily Tat, David, MD   20 mg at 08/13/22 0924   ondansetron (ZOFRAN) tablet 4 mg  4 mg Oral Q6H PRN Tat, Shanon Brow, MD       Or   ondansetron (ZOFRAN) injection 4 mg  4 mg Intravenous Q6H PRN Tat,  Shanon Brow, MD       pantoprazole (PROTONIX) EC tablet 40 mg  40 mg Oral Daily Tat, Shanon Brow, MD   40 mg at 08/13/22 0925   polyethylene glycol (MIRALAX / GLYCOLAX) packet 17 g  17 g Oral Daily Tat, Shanon Brow, MD   17 g at 08/13/22 3335   senna (SENOKOT) tablet 17.2 mg  2 tablet Oral Daily Tat, Shanon Brow, MD   17.2 mg at 08/13/22 0924   traMADol (ULTRAM) tablet 50 mg  50 mg Oral Q12H PRN Orson Eva, MD   50 mg at 08/13/22 4562     Discharge Medications: Please see discharge summary for a list of discharge medications.  Relevant Imaging Results:  Relevant Lab Results:   Additional Information SSN:  [id number], [place]

## 2022-08-13 NOTE — Progress Notes (Signed)
Patient reported complaints of being anxious. MD Tat made aware.

## 2022-08-13 NOTE — TOC Initial Note (Signed)
Transition of Care [facility]) - Initial/Assessment Note    Patient Details  Name: Julia Choi MRN: [id number] Date of Birth: 09-Nov-1935  [facility]) CM/SW Contact:    Shade Flood, LCSW Phone Number: 08/13/2022, 9:24 AM  Clinical Narrative:                  Pt admitted from home. PT recommending SNF rehab at dc. Spoke with pt today to assess and review dc planning. Pt requested that TOC contact her son and DIL as she does not fully understand the information presented.  Spoke with pt's son and DIL by phone. Explained PT recommendation. They state that pt has been at [facility] in the past. Reviewed CMS provider options and will refer as requested. TOC will start insurance auth as well.  Will follow.  Expected Discharge Plan: Skilled Nursing Facility Barriers to Discharge: Continued Medical Work up   Patient Goals and CMS Choice Patient states their goals for this hospitalization and ongoing recovery are:: rehab CMS Medicare.gov Compare Post Acute Care list provided to:: Patient Represenative (must comment) Choice offered to / list presented to : Adult [place] ownership interest in [facility].provided to:: Adult Children    Expected Discharge Plan and Services In-house Referral: Clinical Social Work   Post Acute Care Choice: [place] Living arrangements for the past 2 months: [place]                                      Prior Living Arrangements/Services Living arrangements for the past 2 months: Single Family Home Lives with:: Self Patient language and need for interpreter reviewed:: Yes Do you feel safe going back to the place where you live?: Yes      Need for Family Participation in Patient Care: No (Comment) Care giver support system in place?: Yes (comment)   Criminal Activity/Legal Involvement Pertinent to Current Situation/Hospitalization: No - Comment as needed  Activities of Daily Living Home  Assistive Devices/Equipment: None ADL Screening (condition at time of admission) Patient's cognitive ability adequate to safely complete daily activities?: Yes Is the patient deaf or have difficulty hearing?: Yes Does the patient have difficulty seeing, even when wearing glasses/contacts?: No Does the patient have difficulty concentrating, remembering, or making decisions?: Yes (intermittent confusion) Patient able to express need for assistance with ADLs?: Yes Does the patient have difficulty dressing or bathing?: Yes Independently performs ADLs?: No Communication: Independent Dressing (OT): Needs assistance Is this a change from baseline?: Change from baseline, expected to last <3days Grooming: Needs assistance Is this a change from baseline?: Change from baseline, expected to last <3 days Feeding: Needs assistance Is this a change from baseline?: Change from baseline, expected to last <3 days Bathing: Needs assistance Is this a change from baseline?: Change from baseline, expected to last <3 days Toileting: Needs assistance Is this a change from baseline?: Change from baseline, expected to last <3 days In/Out Bed: Needs assistance Is this a change from baseline?: Change from baseline, expected to last <3 days Walks in Home: Needs assistance Is this a change from baseline?: Change from baseline, expected to last <3 days Does the patient have difficulty walking or climbing stairs?: Yes Weakness of Legs: Left Weakness of Arms/Hands: None  Permission Sought/Granted Permission sought to share information with : Facility Art therapist granted to share information with : Yes, Verbal Permission Granted  Permission granted to share info w AGENCY: snfs        Emotional Assessment   Attitude/Demeanor/Rapport: Engaged Affect (typically observed): Pleasant Orientation: : Oriented to Self, Oriented to Place, Oriented to  Time, Oriented to Situation Alcohol /  Substance Use: Not Applicable Psych Involvement: No (comment)  Admission diagnosis:  Hyponatremia [E87.1] Patient Active Problem List   Diagnosis Date Noted   Acute metabolic encephalopathy 44/81/8563   Lytic bone lesion of hip 08/09/2022   Hyponatremia 08/09/2022   Obesity (BMI 30-39.9) 08/09/2022   Left hip pain 07/30/2022   Type 2 diabetes mellitus ([place]) 01/19/2022   Insomnia 01/19/2022   Osteopenia 14/97/0263   Systolic murmur 78/58/8502   GERD (gastroesophageal reflux disease) 12/28/2020   Essential hypertension 12/15/2016   Mixed hyperlipidemia 12/15/2016   PCP:  Coral Spikes, DO Pharmacy:   [id number] [place], [place] [address] [place] [address] Phone: [phone] Fax: [phone]     Social Determinants of Health (SDOH) Social History: SDOH Screenings   Food Insecurity: No Food Insecurity (08/10/2022)  Housing: Low Risk  (08/10/2022)  Transportation Needs: No Transportation Needs (08/10/2022)  Utilities: Not At Risk (08/10/2022)  Alcohol Screen: Low Risk  (09/12/2021)  Depression (PHQ2-9): Medium Risk (08/09/2022)  Financial Resource Strain: Low Risk  (09/12/2021)  Physical Activity: Sufficiently Active (09/12/2021)  Social Connections: Moderately Integrated (09/12/2021)  Stress: No Stress Concern Present (09/12/2021)  Tobacco Use: Low Risk  (08/10/2022)   SDOH Interventions:     Readmission Risk Interventions    08/13/2022    9:23 AM  Readmission Risk Prevention Plan  Medication Screening Complete  Transportation Screening Complete

## 2022-08-13 NOTE — TOC Transition Note (Signed)
Transition of Care [facility]) - CM/SW Discharge Note   Patient Details  Name: Julia Choi MRN: [id number] Date of Birth: 1935/08/02  [facility]) CM/SW Contact:  Shade Flood, LCSW Phone Number: 08/13/2022, 4:11 PM   Clinical Narrative:     SNF bed offers presented to pt's family and they selected [facility]. Pt received insurance auth for SNF. Pt will transfer to [facility] today.  DC clinical sent electronically. RN to call report.  Family aware and in agreement.  No other TOC needs for dc.  Final next level of care: Skilled Nursing Facility Barriers to Discharge: Barriers Resolved   Patient Goals and CMS Choice CMS Medicare.gov Compare Post Acute Care list provided to:: Patient Represenative (must comment) Choice offered to / list presented to : Adult Children  Discharge Placement                Patient [facility] at: [facility] Patient to be transferred to facility by: W/C Name of family member notified: Debbie Patient and family notified of of transfer: 08/13/22  Discharge Plan and Services Additional resources added to the After Visit Summary for   In-house Referral: Clinical Social Work   Post Acute Care Choice: [place]                               Social Determinants of Health (SDOH) Interventions SDOH Screenings   Food Insecurity: No Food Insecurity (08/10/2022)  Housing: Low Risk  (08/10/2022)  Transportation Needs: No Transportation Needs (08/10/2022)  Utilities: Not At Risk (08/10/2022)  Alcohol Screen: Low Risk  (09/12/2021)  Depression (PHQ2-9): Medium Risk (08/09/2022)  Financial Resource Strain: Low Risk  (09/12/2021)  Physical Activity: Sufficiently Active (09/12/2021)  Social Connections: Moderately Integrated (09/12/2021)  Stress: No Stress Concern Present (09/12/2021)  Tobacco Use: Low Risk  (08/10/2022)     Readmission Risk Interventions    08/13/2022    9:23 AM  Readmission Risk Prevention Plan   Medication Screening Complete  Transportation Screening Complete

## 2022-08-13 NOTE — Telephone Encounter (Signed)
Family called and stated they had to take patient to ER because of severe pain and she is currently admitted- she can not walk at all and they are looking into inpatient rehab center to get her back on her feet. They state the patient is also severely depressed and anxious and the ER doctor recommends a low dose depression medication and they will discuss with rehab center further She stated the patient saw the oncologist due to bone lesion seen on x ray but they did an MRI in hospital and it showed nothing. They want to know if she still needs to pursue this further with CT scheduled this week and follow up with oncology or not since MRI is clear  Please advise

## 2022-08-13 NOTE — Progress Notes (Signed)
Patient ID: Julia Choi, female   DOB: Jul 08, 1936, 87 y.o.   MRN: [id number]  [facility] Progress Note   Assessment/ Plan:   1.  Acute hyponatremia: Appears to have been euvolemic to possibly slightly hypovolemic on initial exam.  Was hypoosmolar indicating true hyponatremia with inappropriately elevated urine osmolality with relatively low urine sodium indicating inadequate solute intake (likely masked by recent thiazide use) with a possible differential diagnosis of SIADH.  Sodium level relatively unchanged overnight and she appears to have some volume excess.  Will begin low-dose oral furosemide 20 mg daily that can be continued as an outpatient for complementary antihypertensive therapy while limiting risk of hyponatremia.  No indication for hypertonic saline or aquaretic at this time.  Begin fluid restriction (<1.5 L/day) 2.  Hypertension: On Bystolic, continue to hold hydrochlorothiazide at this time. 3.  Left hip lytic lesion/pain: Ongoing oncology follow-up with plans noted for outpatient PET scan.  MRI of the left hip did not show any acute osseous abnormality or evidence of myositis.  Subjective:   She reports to be feeling poorly this morning with generalized fatigue and lower extremity swelling.  Appears to have had an episode of orthostatic dizziness earlier while ambulating to [facility]   Objective:   BP (!) 151/72 (BP Location: Right Arm)   Pulse 75   Temp 97.8 F (36.6 C) (Oral)   Resp 14   Ht 5\' 1"  (1.549 m)   Wt 69.3 kg   SpO2 99%   BMI 28.87 kg/m   Intake/Output Summary (Last 24 hours) at 08/13/2022 3875 Last data filed at 08/12/2022 1200 Gross per 24 hour  Intake 480 ml  Output --  Net 480 ml   Weight change:   Physical Exam: Gen: Appears fatigued resting in bed, alert/oriented CVS: Pulse regular rhythm, normal rate, S1 and S2 normal Resp: Poor inspiratory effort with decreased breath sounds over bases, no distinct rales or rhonchi Abd: Soft, obese,  nontender, bowel sounds normal Ext: Trace edema over lower extremities/ankles and hands/fingers  Imaging: US Venous Img Lower Unilateral Left (DVT)  Result Date: 08/11/2022 CLINICAL DATA:  Left lower extremity pain for the past 3 weeks. Evaluate for DVT. EXAM: LEFT LOWER EXTREMITY VENOUS DOPPLER ULTRASOUND TECHNIQUE: Gray-scale sonography with graded compression, as well as color Doppler and duplex ultrasound were performed to evaluate the lower extremity deep venous systems from the level of the common femoral vein and including the common femoral, femoral, profunda femoral, popliteal and calf veins including the posterior tibial, peroneal and gastrocnemius veins when visible. The superficial great saphenous vein was also interrogated. Spectral Doppler was utilized to evaluate flow at rest and with distal augmentation maneuvers in the common femoral, femoral and popliteal veins. COMPARISON:  None Available. FINDINGS: Contralateral Common Femoral Vein: Respiratory phasicity is normal and symmetric with the symptomatic side. No evidence of thrombus. Normal compressibility. Common Femoral Vein: No evidence of thrombus. Normal compressibility, respiratory phasicity and response to augmentation. Saphenofemoral Junction: No evidence of thrombus. Normal compressibility and flow on color Doppler imaging. Profunda Femoral Vein: No evidence of thrombus. Normal compressibility and flow on color Doppler imaging. Femoral Vein: No evidence of thrombus. Normal compressibility, respiratory phasicity and response to augmentation. Popliteal Vein: No evidence of thrombus. Normal compressibility, respiratory phasicity and response to augmentation. Calf Veins: No evidence of thrombus. Normal compressibility and flow on color Doppler imaging. Superficial Great Saphenous Vein: No evidence of thrombus. Normal compressibility. Other Findings:  None. IMPRESSION: No evidence of DVT within the left lower  extremity. Electronically Signed    By: Sandi Mariscal M.D.   On: 08/11/2022 09:56    Labs: BMET Recent Labs  Lab 08/10/22 0415 08/10/22 1100 08/10/22 1611 08/10/22 2307 08/11/22 0504 08/12/22 0452 08/13/22 0534  NA 120* 120* 121* 123* 127* 127* 127*  K 2.8* 3.6 3.9 3.8 3.6 3.8 3.6  CL 87* 88* 90* 92* 95* 96* 94*  CO2 22 21* 23 20* 22 21* 22  GLUCOSE 104* 135* 123* 98 117* 104* 108*  BUN 18 20 21 19 15 10 8   CREATININE 0.82 1.01* 0.97 0.87 0.77 0.67 0.77  CALCIUM 9.1 8.7* 8.9 8.5* 8.7* 8.8* 9.0  PHOS 3.1  --   --   --   --   --   --    CBC Recent Labs  Lab 08/09/22 1400 08/09/22 1811 08/10/22 0415  WBC 7.3 6.7 9.3  NEUTROABS 5.3 4.5  --   HGB 12.9 12.7 12.3  HCT 35.3* 34.8* 34.2*  MCV 88.9 89.5 90.2  PLT 302 260 223    Medications:     acetaminophen  650 mg Oral Q6H   atorvastatin  10 mg Oral Daily   calcium carbonate  1,250 mg Oral Daily   Chlorhexidine Gluconate Cloth  6 each Topical Q0600   cholecalciferol  1,000 Units Oral Daily   enoxaparin (LOVENOX) injection  40 mg Subcutaneous Q24H   multivitamin with minerals  1 tablet Oral Daily   nebivolol  20 mg Oral Daily   pantoprazole  40 mg Oral Daily   polyethylene glycol  17 g Oral Daily   senna  2 tablet Oral Daily    Elmarie Shiley, MD 08/13/2022, 8:38 AM

## 2022-08-13 NOTE — Telephone Encounter (Signed)
Telephone call- mailbox not accepting messages.

## 2022-08-13 NOTE — Telephone Encounter (Signed)
Coral Spikes, DO     Recommend they reach out to oncology to discus

## 2022-08-13 NOTE — Discharge Summary (Signed)
Physician Discharge Summary   Patient: Julia Choi MRN: [id number] DOB: 1935/12/09  Admit date:     08/09/2022  Discharge date: 08/13/22  Discharge Physician: Shanon Brow Zavien Clubb   PCP: Coral Spikes, DO   Recommendations at discharge:   Please follow up with primary care provider within 1-2 weeks  Please repeat BMP in one week   Hospital Course: 87 y.o. female with medical history significant of hypertension, hyperlipidemia, GERD who presents to the ED emergency department due to about 3-week onset of left hip and thigh pain with radiation to left knee.   It has been difficult for her to lie in the bed, so she has been sleeping on a recliner in the living room with her feet up and she has been taking tramadol 50 mg daily along with extra strength Tylenol.  Patient denies any history of fall or injury.  She lives alone and was capable of doing her ADLs except that her daughter-in-law has been staying with her since the last 10 days and she was noted to have been more confused on the day of admission.  The patient had been taking tramadol for her pain at home.  Apparently she developed constipation with hydrocodone and had to take a laxative. Notably, the patient had an office visit with Dr. Delton Coombes on 08/09/2022.  She is currently being worked up for a lytic type lesion on her left ilium.  Plans are for an outpatient PET scan.  Routine labs were obtained and showed the patient had a sodium of 118.  As result, the patient was brought to the emergency department for further evaluation. In the ED, the patient was afebrile hemodynamically stable with oxygen saturation 98% on room air. Sodium 118, potassium 3.7, bicarbonate 22, serum creatinine 0.99.  WBC 7.3, hemoglobin 12.9, platelets 302.  The patient was started on normal saline.  The patient was started on time limited IVF.  Nephrology was consulted to assist with management.  Assessment and Plan: Hyponatremia -Serum osmolarity 251 -Urine sodium  37 -urine osm -240 -suspect poor solute intake and a component of polydipsia -Nephrology consult appreciated -initially on isotonic saline -d/c with lasix 20 mg daily -limit fluid intake 1.5L per day -Na stable >48 hour at time of d/c   Left hip lytic lesion and pain -Immunofixation was normal -Patient follows Dr. Delton Coombes with plans for PET scan -Continue tramadol and acetaminophen -MR left hip--no acute osseous abnormality; no bone lesions; mild tendinosis of left gluteus medius and minimus tendons -check CK--75 -ESR--16 -pt has only required minimal doses of tramadol -pain overall is improving and pt is able to bear weight -PT>>SNF   Acute metabolic Encephalopathy -CT brain neg -UA neg for pyuria -B12--755 -TSH--1.792 -folate--21.9 -likely combination of hyponatremia and opioids -overall improving   Essential hypertension -Continue nebivolol -will not restart HCTZ/ARB -BP acceptable/controlled   Hypokalemia -repleted -hold  HCTZ -mag 1.8   Mixed hyperlipidemia Continue Lipitor   GERD Continue Protonix   Obesity (BMI 30.8) Continue diet and lifestyle modification   Osteopenia Continue Os-Cal, vitamin D3, multivitamin   Goals of Care -confirmed with patient--DNR       Pain control - [facility] Controlled Substance Reporting System database was reviewed. and patient was instructed, not to drive, operate heavy machinery, perform activities at heights, swimming or participation in water activities or provide baby-sitting services while on Pain, Sleep and Anxiety Medications; until their outpatient Physician has advised to do so again. Also recommended to not to take more than  prescribed Pain, Sleep and Anxiety Medications.  Consultants: none Procedures performed: none  Disposition: Skilled nursing facility Diet recommendation:  Cardiac diet DISCHARGE MEDICATION: Allergies as of 08/13/2022       Reactions   Prednisone Palpitations         Medication List     STOP taking these medications    losartan-hydrochlorothiazide 100-25 MG tablet Commonly known as: HYZAAR       TAKE these medications    acetaminophen 500 MG tablet Commonly known as: TYLENOL Take 1,000 mg by mouth every 6 (six) hours as needed. What changed: Another medication with the same name was added. Make sure you understand how and when to take each.   acetaminophen 325 MG tablet Commonly known as: TYLENOL Take 2 tablets (650 mg total) by mouth every 6 (six) hours. What changed: You were already taking a medication with the same name, and this prescription was added. Make sure you understand how and when to take each.   atorvastatin 10 MG tablet Commonly known as: LIPITOR TAKE 1 TABLET BY MOUTH EVERY EVENING What changed:  how much to take how to take this when to take this   calcium carbonate 1250 (500 Ca) MG chewable tablet Commonly known as: OS-CAL Chew 1 tablet by mouth daily.   cetirizine 10 MG tablet Commonly known as: ZYRTEC Take 10 mg by mouth daily as needed for allergies.   Dulcolax Soft Chews 1200 MG Chew Generic drug: Magnesium Hydroxide Chew 1 tablet by mouth daily.   furosemide 20 MG tablet Commonly known as: LASIX Take 1 tablet (20 mg total) by mouth daily. Start taking on: August 14, 2022   multivitamin capsule Take 1 capsule by mouth daily.   Nebivolol HCl 20 MG Tabs Take one tablet po every day   pantoprazole 40 MG tablet Commonly known as: PROTONIX TAKE 1 TABLET BY MOUTH EVERY DAY   polyethylene glycol 17 g packet Commonly known as: MIRALAX / GLYCOLAX Take 17 g by mouth daily. Start taking on: August 14, 2022   senna 8.6 MG Tabs tablet Commonly known as: SENOKOT Take 2 tablets (17.2 mg total) by mouth daily. Start taking on: August 14, 2022   traMADol 50 MG tablet Commonly known as: ULTRAM Take 1 tablet (50 mg total) by mouth every 12 (twelve) hours as needed for moderate pain.   Vitamin D3 25  MCG (1000 UT) Caps Take 1 capsule by mouth daily.        Contact information for after-discharge care     [facility] .   Service: Skilled Nursing Contact information: [address] [place] [phone]                    Discharge Exam: Danley Danker Weights   08/10/22 0030 08/11/22 0426 08/11/22 2100  Weight: 73.5 kg 74.7 kg 69.3 kg   HEENT:  [place]/AT, No thrush, no icterus CV:  RRR, no rub, no S3, no S4 Lung:  CTA, no wheeze, no rhonchi Abd:  soft/+BS, NT Ext:  No edema, no lymphangitis, no synovitis, no rash   Condition at discharge: stable  The results of significant diagnostics from this hospitalization (including imaging, microbiology, ancillary and laboratory) are listed below for reference.   Imaging Studies: MR HIP LEFT WO CONTRAST  Result Date: 08/11/2022 CLINICAL DATA:  Hip pain, chronic, impingement suspected, xray done lytic lesion left ilium. Left hip pain and left lateral thigh pain EXAM:  MR OF THE LEFT HIP WITHOUT CONTRAST TECHNIQUE: Multiplanar, multisequence MR imaging was performed. No intravenous contrast was administered. COMPARISON:  CT 08/09/2022.  X-ray 07/30/2022 FINDINGS: Bones/Joint/Cartilage No acute fracture. No dislocation. Metallic susceptibility artifact related to patient's right ORIF hardware. Heterotopic ossification is noted along the superior aspect of the right greater trochanter. Mild osteoarthritis of the left hip. Trace left hip joint effusion. Degenerative blunting of the superior labrum. No femoral head avascular necrosis. Pelvic bony ring intact without fracture or diastasis. Minimal degenerative changes of the sacroiliac joints and pubic symphysis. No bone marrow edema. No marrow replacing bone lesion. Specifically, there is no bone lesion involving the left iliac wing. Ligaments Intact. Muscles and Tendons Mild tendinosis of the left gluteus medius and minimus  tendons. The hamstring, iliopsoas, rectus femoris, and adductor tendons appear intact without tear or significant tendinosis. Normal muscle bulk and signal intensity without edema, atrophy, or fatty infiltration. Soft tissues No soft tissue edema or fluid collection. No inguinal lymphadenopathy. No acute findings are seen within the pelvis. IMPRESSION: 1. No acute osseous abnormality of the left hip or pelvis. No bone lesions with attention to the left iliac wing. Previously described radiographic abnormality represented overlying bowel gas. No further follow-up imaging is needed. 2. Mild osteoarthritis of the left hip. 3. Trace left hip joint effusion is likely reactive. 4. Mild tendinosis of the left gluteus medius and minimus tendons. Electronically Signed   By: Duanne Guess D.O.   On: 08/11/2022 09:56   US Venous Img Lower Unilateral Left (DVT)  Result Date: 08/11/2022 CLINICAL DATA:  Left lower extremity pain for the past 3 weeks. Evaluate for DVT. EXAM: LEFT LOWER EXTREMITY VENOUS DOPPLER ULTRASOUND TECHNIQUE: Gray-scale sonography with graded compression, as well as color Doppler and duplex ultrasound were performed to evaluate the lower extremity deep venous systems from the level of the common femoral vein and including the common femoral, femoral, profunda femoral, popliteal and calf veins including the posterior tibial, peroneal and gastrocnemius veins when visible. The superficial great saphenous vein was also interrogated. Spectral Doppler was utilized to evaluate flow at rest and with distal augmentation maneuvers in the common femoral, femoral and popliteal veins. COMPARISON:  None Available. FINDINGS: Contralateral Common Femoral Vein: Respiratory phasicity is normal and symmetric with the symptomatic side. No evidence of thrombus. Normal compressibility. Common Femoral Vein: No evidence of thrombus. Normal compressibility, respiratory phasicity and response to augmentation. Saphenofemoral  Junction: No evidence of thrombus. Normal compressibility and flow on color Doppler imaging. Profunda Femoral Vein: No evidence of thrombus. Normal compressibility and flow on color Doppler imaging. Femoral Vein: No evidence of thrombus. Normal compressibility, respiratory phasicity and response to augmentation. Popliteal Vein: No evidence of thrombus. Normal compressibility, respiratory phasicity and response to augmentation. Calf Veins: No evidence of thrombus. Normal compressibility and flow on color Doppler imaging. Superficial Great Saphenous Vein: No evidence of thrombus. Normal compressibility. Other Findings:  None. IMPRESSION: No evidence of DVT within the left lower extremity. Electronically Signed   By: Simonne Come M.D.   On: 08/11/2022 09:56   CT Hip Left Wo Contrast  Result Date: 08/09/2022 CLINICAL DATA:  Lytic lesion. EXAM: CT OF THE LEFT HIP WITHOUT CONTRAST TECHNIQUE: Multidetector CT imaging of the left hip was performed according to the standard protocol. Multiplanar CT image reconstructions were also generated. RADIATION DOSE REDUCTION: This exam was performed according to the departmental dose-optimization program which includes automated exposure control, adjustment of the mA and/or kV according to patient size and/or use  of iterative reconstruction technique. COMPARISON:  X-ray 07/30/2022. FINDINGS: Bones/Joint/Cartilage Mild concentric joint space loss of the left hip. No fracture or dislocation. There is also mild degenerative changes along the left sacroiliac joint and hypertrophic changes along the pubic symphysis. No definite lytic bone lesions identified by CT. Preserved contours of the urinary bladder. The uterus is present. The visualized bowel in the pelvis is nondilated. No developing lymph node enlargement. Scattered vascular calcifications. IMPRESSION: Degenerative changes. No specific lytic destructive process involving the iliac bone. If there is further concern a bone scan  or MRI could be considered. Please see separate dictation of the standard abdomen and pelvis CT with contrast from same day Electronically Signed   By: Jill Side M.D.   On: 08/09/2022 20:32   CT Head Wo Contrast  Result Date: 08/09/2022 CLINICAL DATA:  Mental status change, unknown cause EXAM: CT HEAD WITHOUT CONTRAST TECHNIQUE: Contiguous axial images were obtained from the base of the skull through the vertex without intravenous contrast. RADIATION DOSE REDUCTION: This exam was performed according to the departmental dose-optimization program which includes automated exposure control, adjustment of the mA and/or kV according to patient size and/or use of iterative reconstruction technique. COMPARISON:  None Available. FINDINGS: Brain: Cerebral ventricle sizes are concordant with the degree of cerebral volume loss. Patchy and confluent areas of decreased attenuation are noted throughout the deep and periventricular white matter of the cerebral hemispheres bilaterally, compatible with chronic microvascular ischemic disease. No evidence of large-territorial acute infarction. No parenchymal hemorrhage. No mass lesion. No extra-axial collection. No mass effect or midline shift. No hydrocephalus. Basilar cisterns are patent. Vascular: No hyperdense vessel. Atherosclerotic calcifications are present within the cavernous internal carotid and vertebral arteries. Skull: No acute fracture or focal lesion. Sinuses/Orbits: Sphenoid sinus mucosal thickening. Paranasal sinuses and mastoid air cells are clear. Bilateral lens replacement. Otherwise the orbits are unremarkable. Other: None. IMPRESSION: No acute intracranial abnormality. Electronically Signed   By: Iven Finn M.D.   On: 08/09/2022 20:26   CT Abdomen Pelvis W Contrast  Result Date: 08/09/2022 CLINICAL DATA:  Metastatic disease evaluation for iliac lesion. EXAM: CT ABDOMEN AND PELVIS WITH CONTRAST TECHNIQUE: Multidetector CT imaging of the abdomen and  pelvis was performed using the standard protocol following bolus administration of intravenous contrast. RADIATION DOSE REDUCTION: This exam was performed according to the departmental dose-optimization program which includes automated exposure control, adjustment of the mA and/or kV according to patient size and/or use of iterative reconstruction technique. CONTRAST:  155mL OMNIPAQUE IOHEXOL 300 MG/ML  SOLN COMPARISON:  Left femur x-ray 08/09/2022 FINDINGS: Lower chest: No acute abnormality. Hepatobiliary: No focal liver abnormality is seen. No gallstones, gallbladder wall thickening, or biliary dilatation. Pancreas: Unremarkable. No pancreatic ductal dilatation or surrounding inflammatory changes. Spleen: Normal in size without focal abnormality. Adrenals/Urinary Tract: There is a 2 cm right renal cyst and a 3.5 cm left renal cyst. There is no hydronephrosis or perinephric fat stranding. The adrenal glands and bladder are within normal limits. Stomach/Bowel: Stomach is within normal limits. Appendix appears normal. No evidence of bowel wall thickening, distention, or inflammatory changes. Vascular/Lymphatic: Aortic atherosclerosis. No enlarged abdominal or pelvic lymph nodes. Reproductive: Uterus and bilateral adnexa are unremarkable. Other: No abdominal wall hernia or abnormality. No abdominopelvic ascites. Musculoskeletal: Right-sided hip screw present. No acute fracture or focal osseous lesion identified. Degenerative changes affect spine. IMPRESSION: 1. No acute localizing process in the abdomen or pelvis. 2. Bilateral renal cysts. No follow-up needed. Aortic Atherosclerosis (ICD10-I70.0). Electronically Signed  By: Ronney Asters M.D.   On: 08/09/2022 20:26   DG Chest Port 1 View  Result Date: 08/09/2022 CLINICAL DATA:  Altered mental status EXAM: PORTABLE CHEST 1 VIEW COMPARISON:  12/15/2016 x-ray FINDINGS: No consolidation, pneumothorax or effusion. Normal cardiopericardial silhouette with tortuous  aorta. Overlapping cardiac leads. IMPRESSION: No acute cardiopulmonary disease Electronically Signed   By: Jill Side M.D.   On: 08/09/2022 18:16   DG FEMUR MIN 2 VIEWS LEFT  Result Date: 08/09/2022 CLINICAL DATA:  Left femur pain. EXAM: LEFT FEMUR 2 VIEWS COMPARISON:  July 30, 2022. FINDINGS: There is no evidence of fracture or other focal bone lesions. Lucency seen in left ilium on prior radiograph is not well visualized currently, and potentially may have represented overlying bowel gas. Soft tissues are unremarkable. IMPRESSION: Negative. Electronically Signed   By: Marijo Conception M.D.   On: 08/09/2022 14:55   DG Hip Unilat W OR W/O Pelvis 2-3 Views Left  Result Date: 07/30/2022 CLINICAL DATA:  Left hip pain for 1 week no injury. EXAM: DG HIP (WITH OR WITHOUT PELVIS) 2-3V LEFT COMPARISON:  December 16, 2016, December 31, 2016 FINDINGS: There is no evidence of hip fracture or dislocation. Status post prior compression screw placement of the right proximal femur. 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 1.9 cm lytic lucency in the left ilium not seen on prior exam of 2018. IMPRESSION: 1. No acute fracture or dislocation. 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 2. 1.9 cm lytic lucency in the left ilium not seen on prior exam of 2018. Nonspecific. Differential diagnosis includes but is not limited to multiple myeloma, metastatic disease. Clinical correlation is recommended. Electronically Signed   By: Abelardo Diesel M.D.   On: 07/30/2022 12:41    Microbiology: Results for orders placed or performed during the hospital encounter of 08/09/22  MRSA Next Gen by PCR, Nasal     Status: None   Collection Time: 08/10/22 12:20 AM   Specimen: Nasal Mucosa; Nasal Swab  Result Value Ref Range Status   MRSA by PCR Next Gen NOT DETECTED NOT DETECTED Final    Comment: (NOTE) The GeneXpert MRSA Assay (FDA approved for NASAL specimens only), is one component  of a comprehensive MRSA colonization surveillance program. It is not intended to diagnose MRSA infection nor to guide or monitor treatment for MRSA infections. Test performance is not FDA approved in patients less than 22 years old. Performed at [facility], [address] [place],  [address]   Resp panel by RT-PCR (RSV, Flu A&B, Covid) Anterior Nasal Swab     Status: None   Collection Time: 08/13/22  1:51 PM   Specimen: Anterior Nasal Swab  Result Value Ref Range Status   SARS Coronavirus 2 by RT PCR NEGATIVE NEGATIVE Final    Comment: (NOTE) SARS-CoV-2 target nucleic acids are NOT DETECTED.  The SARS-CoV-2 RNA is generally detectable in upper respiratory specimens during the acute phase of infection. The lowest concentration of SARS-CoV-2 viral copies this assay can detect is 138 copies/mL. A negative result does not preclude SARS-Cov-2 infection and should not be used as the sole basis for treatment or other patient management decisions. A negative result may occur with  improper specimen collection/handling, submission of specimen other than nasopharyngeal swab, presence of viral mutation(s) within the areas targeted by this assay, and inadequate number of viral copies(<138 copies/mL). A negative result must be combined with clinical observations, patient history, and  epidemiological information. The expected result is Negative.  Fact Sheet for Patients:  EntrepreneurPulse.com.au  Fact Sheet for Healthcare Providers:  IncredibleEmployment.be  This test is no t yet approved or cleared by the [place] FDA and  has been authorized for detection and/or diagnosis of SARS-CoV-2 by FDA under an Emergency Use Authorization (EUA). This EUA will remain  in effect (meaning this test can be used) for the duration of the COVID-19 declaration under Section 564(b)(1) of the Act, 21 U.S.C.section 360bbb-3(b)(1), unless the authorization is  terminated  or revoked sooner.       Influenza A by PCR NEGATIVE NEGATIVE Final   Influenza B by PCR NEGATIVE NEGATIVE Final    Comment: (NOTE) The Xpert Xpress SARS-CoV-2/FLU/RSV plus assay is intended as an aid in the diagnosis of influenza from Nasopharyngeal swab specimens and should not be used as a sole basis for treatment. Nasal washings and aspirates are unacceptable for Xpert Xpress SARS-CoV-2/FLU/RSV testing.  Fact Sheet for Patients: EntrepreneurPulse.com.au  Fact Sheet for Healthcare Providers: IncredibleEmployment.be  This test is not yet approved or cleared by the [place] FDA and has been authorized for detection and/or diagnosis of SARS-CoV-2 by FDA under an Emergency Use Authorization (EUA). This EUA will remain in effect (meaning this test can be used) for the duration of the COVID-19 declaration under Section 564(b)(1) of the Act, 21 U.S.C. section 360bbb-3(b)(1), unless the authorization is terminated or revoked.     Resp Syncytial Virus by PCR NEGATIVE NEGATIVE Final    Comment: (NOTE) Fact Sheet for Patients: EntrepreneurPulse.com.au  Fact Sheet for Healthcare Providers: IncredibleEmployment.be  This test is not yet approved or cleared by the [place] FDA and has been authorized for detection and/or diagnosis of SARS-CoV-2 by FDA under an Emergency Use Authorization (EUA). This EUA will remain in effect (meaning this test can be used) for the duration of the COVID-19 declaration under Section 564(b)(1) of the Act, 21 U.S.C. section 360bbb-3(b)(1), unless the authorization is terminated or revoked.  Performed at [facility], [address], [place], [place] [address]     Labs: CBC: Recent Labs  Lab 08/09/22 1400 08/09/22 1811 08/10/22 0415  WBC 7.3 6.7 9.3  NEUTROABS 5.3 4.5  --   HGB 12.9 12.7 12.3  HCT 35.3* 34.8* 34.2*  MCV 88.9 89.5 90.2  PLT 302 260  [id number]   Basic Metabolic Panel: Recent Labs  Lab 08/10/22 0415 08/10/22 1100 08/10/22 1611 08/10/22 2307 08/11/22 0504 08/12/22 0452 08/13/22 0534  NA 120*   < > 121* 123* 127* 127* 127*  K 2.8*   < > 3.9 3.8 3.6 3.8 3.6  CL 87*   < > 90* 92* 95* 96* 94*  CO2 22   < > 23 20* 22 21* 22  GLUCOSE 104*   < > 123* 98 117* 104* 108*  BUN 18   < > 21 19 15 10 8   CREATININE 0.82   < > 0.97 0.87 0.77 0.67 0.77  CALCIUM 9.1   < > 8.9 8.5* 8.7* 8.8* 9.0  MG 1.8  --   --   --   --  1.7 1.6*  PHOS 3.1  --   --   --   --   --   --    < > = values in this interval not displayed.   Liver Function Tests: Recent Labs  Lab 08/09/22 1400 08/09/22 1811 08/10/22 0415  AST 31 27 26   ALT 26 25 24   ALKPHOS 45 42  37*  BILITOT 1.3* 1.5* 1.0  PROT 7.9 7.7 6.9  ALBUMIN 4.5 4.5 4.0   CBG: Recent Labs  Lab 08/11/22 2031  GLUCAP 93    Discharge time spent: greater than 30 minutes.  Signed: Orson Eva, MD [facility] 08/13/2022

## 2022-08-13 NOTE — Progress Notes (Addendum)
Patient discharged to [facility], transported by staff to [facility]. Discharge paperwork placed in discharge packet to give to facility. Belongings sent with patient.

## 2022-08-14 ENCOUNTER — Encounter: Payer: Self-pay | Admitting: Adult Health

## 2022-08-14 ENCOUNTER — Non-Acute Institutional Stay (SKILLED_NURSING_FACILITY): Payer: Medicare Other | Admitting: Adult Health

## 2022-08-14 DIAGNOSIS — I10 Essential (primary) hypertension: Secondary | ICD-10-CM

## 2022-08-14 DIAGNOSIS — I700 Atherosclerosis of aorta: Secondary | ICD-10-CM

## 2022-08-14 DIAGNOSIS — E871 Hypo-osmolality and hyponatremia: Secondary | ICD-10-CM | POA: Diagnosis not present

## 2022-08-14 DIAGNOSIS — T402X5A Adverse effect of other opioids, initial encounter: Secondary | ICD-10-CM

## 2022-08-14 DIAGNOSIS — M1612 Unilateral primary osteoarthritis, left hip: Secondary | ICD-10-CM

## 2022-08-14 DIAGNOSIS — K219 Gastro-esophageal reflux disease without esophagitis: Secondary | ICD-10-CM

## 2022-08-14 DIAGNOSIS — E782 Mixed hyperlipidemia: Secondary | ICD-10-CM

## 2022-08-14 DIAGNOSIS — M7602 Gluteal tendinitis, left hip: Secondary | ICD-10-CM

## 2022-08-14 DIAGNOSIS — K5903 Drug induced constipation: Secondary | ICD-10-CM | POA: Diagnosis not present

## 2022-08-14 DIAGNOSIS — M898X5 Other specified disorders of bone, thigh: Secondary | ICD-10-CM

## 2022-08-14 NOTE — Progress Notes (Signed)
Location:  [facility] Room Number: 136 Place of Service:  SNF (31)   CODE STATUS: dnr   Allergies  Allergen Reactions   Prednisone Palpitations    Chief Complaint  Patient presents with   Hospitalization Follow-up    HPI:  She is a 87 year old woman who has been hospitalized from 08-09-22 through 08-13-22. Her medical history includes: hypertension; hyperlipidemia; gerd.  She had a 3 week onset of left hip and thigh pain which radiated to her left knee. She had been sleeping in recliner due to her pain. For her pain she was taking tramadol and tylenol. There are no reports of falls; injuries. She has been living alone and managing her adl care. She did develop constipation due to her pain medications; was drinking copious amounts of water for her constipation. She went to Dr. Delton Coombes on 08-09-22; for a work up for a lytic type lesion on her left ilium. There are plans for outpatient PET scan.  Her lab work demonstrated a sodium of 118. Nephology was consulted. Suspected poor solute intake with a component of polydipsia. She was started on isotonic saline. She is being discharged with lasix low dose. She will need a fluid restriction of 1.5 liters. She has left hip lytic lesion and pain present. Will continue her as needed pain medications. She has had an MRI of left hip. She is here for short term rehab with her goal to return back home. She states that her hip is not hurting at the present time. She will continue to be followed for her chronic illnesses including: Hyponatremia: Mixed hyperlipidemia;  Constipation due to opioid therapy:  Essential hypertension     Past Medical History:  Diagnosis Date   Hypertension    Reflux    cholesterol, hyn    Past Surgical History:  Procedure Laterality Date   INTRAMEDULLARY (IM) NAIL INTERTROCHANTERIC Right 12/16/2016   Procedure: INTRAMEDULLARY (IM) NAIL INTERTROCHANTRIC;  Surgeon: Carole Civil, MD;  Location:  [facility];  Service: Orthopedics;  Laterality: Right;   JOINT REPLACEMENT      Social History   Socioeconomic History   Marital status: Widowed    Spouse name: Not on file   Number of children: 1   Years of education: Not on file   Highest education level: Not on file  Occupational History   Occupation: retired  Tobacco Use   Smoking status: Never   Smokeless tobacco: Never  Substance and Sexual Activity   Alcohol use: No   Drug use: No   Sexual activity: Not Currently  Other Topics Concern   Not on file  Social History Narrative   Widowed since 2018   1 son, lives in [place]   2 grandchildren   1 great granddaughter.   Social Determinants of Health   Financial Resource Strain: Low Risk  (09/12/2021)   Overall Financial Resource Strain (CARDIA)    Difficulty of Paying Living Expenses: Not hard at all  Food Insecurity: No Food Insecurity (08/10/2022)   Hunger Vital Sign    Worried About Running Out of Food in the Last Year: Never true    Ran Out of Food in the Last Year: Never true  Transportation Needs: No Transportation Needs (08/10/2022)   PRAPARE - Hydrologist (Medical): No    Lack of Transportation (Non-Medical): No  Physical Activity: Sufficiently Active (09/12/2021)   Exercise Vital Sign    Days of Exercise per Week:  5 days    Minutes of Exercise per Session: 30 min  Stress: No Stress Concern Present (09/12/2021)   [place]    Feeling of Stress : Not at all  Social Connections: Moderately Integrated (09/12/2021)   Social Connection and Isolation Panel [NHANES]    Frequency of Communication with Friends and Family: More than three times a week    Frequency of Social Gatherings with Friends and Family: Once a week    Attends Religious Services: 1 to 4 times per year    Active Member of [employer] or Organizations: No    Attends Archivist Meetings: 1 to 4 times  per year    Marital Status: Widowed  Intimate Partner Violence: Not At Risk (08/10/2022)   Humiliation, Afraid, Rape, and Kick questionnaire    Fear of Current or Ex-Partner: No    Emotionally Abused: No    Physically Abused: No    Sexually Abused: No   No family history on file.    VITAL SIGNS BP 132/78   Pulse 84   Temp (!) 97.2 F (36.2 C)   Resp 20   Ht 5\' 4"  (1.626 m)   Wt 164 lb 12.8 oz (74.8 kg)   SpO2 98%   BMI 28.29 kg/m   Outpatient Encounter Medications as of 08/14/2022  Medication Sig   acetaminophen (TYLENOL) 325 MG tablet Take 2 tablets (650 mg total) by mouth every 6 (six) hours.   atorvastatin (LIPITOR) 10 MG tablet Take 10 mg by mouth daily.   calcium carbonate (OS-CAL) 1250 (500 Ca) MG chewable tablet Chew 1 tablet by mouth daily.   cetirizine (ZYRTEC) 10 MG tablet Take 10 mg by mouth daily as needed for allergies.   Cholecalciferol (VITAMIN D3) 25 MCG (1000 UT) CAPS Take 1 capsule by mouth daily.   furosemide (LASIX) 20 MG tablet Take 1 tablet (20 mg total) by mouth daily.   Magnesium Hydroxide (DULCOLAX SOFT CHEWS) 1200 MG CHEW Chew 1 tablet by mouth daily.   Multiple Vitamin (MULTIVITAMIN) capsule Take 1 capsule by mouth daily.   Nebivolol HCl 20 MG TABS Take one tablet po every day   pantoprazole (PROTONIX) 40 MG tablet TAKE 1 TABLET BY MOUTH EVERY DAY   senna (SENOKOT) 8.6 MG TABS tablet Take 2 tablets (17.2 mg total) by mouth daily.   traMADol (ULTRAM) 50 MG tablet Take 1 tablet (50 mg total) by mouth every 12 (twelve) hours as needed for moderate pain.   polyethylene glycol (MIRALAX / GLYCOLAX) 17 g packet Take 17 g by mouth daily.   [DISCONTINUED] acetaminophen (TYLENOL) 500 MG tablet Take 1,000 mg by mouth every 6 (six) hours as needed.   [DISCONTINUED] atorvastatin (LIPITOR) 10 MG tablet TAKE 1 TABLET BY MOUTH EVERY EVENING (Patient taking differently: Take 10 mg by mouth daily. TAKE 1 TABLET BY MOUTH EVERY EVENING)   No facility-administered  encounter medications on file as of 08/14/2022.     SIGNIFICANT DIAGNOSTIC EXAMS  TODAY  08-09-22: chest x-ray: No acute cardiopulmonary disease  08-09-22: left femur x-ray: There is no evidence of fracture or other focal bone lesions. Lucency seen in left ilium on prior radiograph is not well visualized currently, and potentially may have represented overlying bowel gas. Soft tissues are unremarkable.  08-09-22: ct of abdomen:  1. No acute localizing process in the abdomen or pelvis. 2. Bilateral renal cysts. No follow-up needed. Aortic Atherosclerosis  08-09-22: ct of left hip Degenerative changes. No  specific lytic destructive process involving the iliac bone. If there is further concern a bone scan or MRI could be considered.  08-11-22: mri left hip  1. No acute osseous abnormality of the left hip or pelvis. No bone lesions with attention to the left iliac wing. Previously described radiographic abnormality represented overlying bowel gas. No further follow-up imaging is needed. 2. Mild osteoarthritis of the left hip. 3. Trace left hip joint effusion is likely reactive. 4. Mild tendinosis of the left gluteus medius and minimus tendons.  LABS REVIEWED TODAY;   08-09-22; wbc 7.3; hgb 12.9; hct 35.3; mcv 88.9 plt 302; glucose 138; bun 21; creat 0.99; k+ 3.7; na++ 118; ca 9.4 gfr 56; protein 7.9 albumin 4.5 urine osmolality 251  08-10-22; wbc 9.3; hgb 12.3; hct 34.2; mcv 90.2 plt 223; glucose 104; bun 18; creat 0.82; k+ 2.8; na++ 120; ca 9.1; gfr >60; protein 6.9 albumin 4.0; mag 1.8; phos 3.1; vitamin B 12: 755 folate 21.9; tsh 1.792; free t4: 1.53 08-12-22: chol 110 ldl 43; trig 91; hdl 49; Am cortisol 21.9 08-13-22: glucose 108; bun 8; creat 0.77; k+ 3.6; na++ 127; ca 9.0 gfr>60     Review of Systems  Constitutional:  Negative for malaise/fatigue.  Respiratory:  Negative for cough and shortness of breath.   Cardiovascular:  Negative for chest pain, palpitations and leg swelling.   Gastrointestinal:  Negative for abdominal pain, constipation and heartburn.  Musculoskeletal:  Negative for back pain, joint pain and myalgias.  Skin: Negative.   Neurological:  Negative for dizziness.  Psychiatric/Behavioral:  The patient is not nervous/anxious.    Physical Exam Constitutional:      General: She is not in acute distress.    Appearance: She is well-developed. She is not diaphoretic.  HENT:     Nose: Nose normal.     Mouth/Throat:     Mouth: Mucous membranes are moist.     Pharynx: Oropharynx is clear.  Eyes:     Conjunctiva/sclera: Conjunctivae normal.  Neck:     Thyroid: No thyromegaly.  Cardiovascular:     Rate and Rhythm: Normal rate and regular rhythm.     Pulses: Normal pulses.     Heart sounds: Normal heart sounds.  Pulmonary:     Effort: Pulmonary effort is normal. No respiratory distress.     Breath sounds: Normal breath sounds.  Abdominal:     General: Bowel sounds are normal. There is no distension.     Palpations: Abdomen is soft.     Tenderness: There is no abdominal tenderness.  Musculoskeletal:        General: Normal range of motion.     Cervical back: Neck supple.     Right lower leg: No edema.     Left lower leg: No edema.  Lymphadenopathy:     Cervical: No cervical adenopathy.  Skin:    General: Skin is warm and dry.  Neurological:     Mental Status: She is alert and oriented to person, place, and time.     Comments: Some mild disorientation present   Psychiatric:        Mood and Affect: Mood normal.      ASSESSMENT/ PLAN:  TODAY  Hyponatremia: she will need a 1500 cc fluid restriction; will continue lasix 20 mg daily. Will follow up labs on 08-20-22   2. Mixed hyperlipidemia; ldl 43 will continue lipitor 10 mg daily   3. Constipation due to opioid therapy: will continue miralax daily; dulcolax soft chew daily;  senna 2 tabs daily   4. Essential hypertension: 132/78: will continue nebivolol 20 mg daily   5. Gastroesophageal  reflux disease without esophagitis: will continue protonix 40 mg daily   6. Lytic bone lesion of left hip: will follow up outpatient with PET scan  7. Aortic atherosclerosis (ct 08-09-22) is on statin   8. Gluteal tendonitis left hip/mild primary osteoarthritis left hip: will continue tylenol 650 mg every 6 hours and tramadol 50 mg twice daily as needed.      Ok Edwards NP [facility]  call [phone]

## 2022-08-14 NOTE — Telephone Encounter (Signed)
Pt daughter in law calling to let us know that pt is in [facility] for rehab. Daughter in Darci Needle states that pt is confused which is not normal for patient, agitated, no patience and obsessive/compulsive. Daughter in law would like psych eval. Advised daughter in law that since she was currently in rehab, she would need to contact them and let them know about what is going on. Daughter in law verbalized understanding.

## 2022-08-16 ENCOUNTER — Ambulatory Visit (HOSPITAL_COMMUNITY): Admission: RE | Admit: 2022-08-16 | Payer: Medicare Other | Source: Ambulatory Visit

## 2022-08-20 ENCOUNTER — Other Ambulatory Visit (HOSPITAL_COMMUNITY)
Admission: RE | Admit: 2022-08-20 | Discharge: 2022-08-20 | Disposition: A | Discharge status: Home / Self Care | Payer: Medicare Other | Source: Skilled Nursing Facility | Attending: Adult Health | Admitting: Adult Health

## 2022-08-20 DIAGNOSIS — I152 Hypertension secondary to endocrine disorders: Secondary | ICD-10-CM | POA: Insufficient documentation

## 2022-08-20 LAB — BASIC METABOLIC PANEL
Anion gap: 10 (ref 5–15)
BUN: 15 mg/dL (ref 8–23)
CO2: 26 mmol/L (ref 22–32)
Calcium: 9.2 mg/dL (ref 8.9–10.3)
Chloride: 97 mmol/L — ABNORMAL LOW (ref 98–111)
Creatinine, Ser: 0.82 mg/dL (ref 0.44–1.00)
GFR, Estimated: >60 mL/min (ref >60)
Glucose, Bld: 119 mg/dL — ABNORMAL HIGH (ref 70–99)
Potassium: 3.8 mmol/L (ref 3.5–5.1)
Sodium: 133 mmol/L — ABNORMAL LOW (ref 135–145)

## 2022-08-20 LAB — MAGNESIUM: Magnesium: 2 mg/dL (ref 1.7–2.4)

## 2022-08-21 ENCOUNTER — Non-Acute Institutional Stay (SKILLED_NURSING_FACILITY): Payer: Medicare Other | Admitting: Internal Medicine

## 2022-08-21 ENCOUNTER — Encounter: Payer: Self-pay | Admitting: Internal Medicine

## 2022-08-21 ENCOUNTER — Inpatient Hospital Stay: Payer: Medicare Other | Attending: Hematology | Admitting: Hematology

## 2022-08-21 VITALS — BP 126/62 | HR 85 | Temp 97.5°F | Resp 16

## 2022-08-21 DIAGNOSIS — R200 Anesthesia of skin: Secondary | ICD-10-CM | POA: Diagnosis not present

## 2022-08-21 DIAGNOSIS — I10 Essential (primary) hypertension: Secondary | ICD-10-CM | POA: Diagnosis not present

## 2022-08-21 DIAGNOSIS — E871 Hypo-osmolality and hyponatremia: Secondary | ICD-10-CM

## 2022-08-21 DIAGNOSIS — R202 Paresthesia of skin: Secondary | ICD-10-CM | POA: Diagnosis not present

## 2022-08-21 DIAGNOSIS — M25552 Pain in left hip: Secondary | ICD-10-CM

## 2022-08-21 DIAGNOSIS — M899 Disorder of bone, unspecified: Secondary | ICD-10-CM | POA: Insufficient documentation

## 2022-08-21 DIAGNOSIS — Z808 Family history of malignant neoplasm of other organs or systems: Secondary | ICD-10-CM | POA: Insufficient documentation

## 2022-08-21 DIAGNOSIS — G9341 Metabolic encephalopathy: Secondary | ICD-10-CM | POA: Diagnosis not present

## 2022-08-21 DIAGNOSIS — L03039 Cellulitis of unspecified toe: Secondary | ICD-10-CM

## 2022-08-21 DIAGNOSIS — M898X5 Other specified disorders of bone, thigh: Secondary | ICD-10-CM

## 2022-08-21 DIAGNOSIS — L299 Pruritus, unspecified: Secondary | ICD-10-CM

## 2022-08-21 NOTE — Progress Notes (Signed)
[place], [place] [address]   CLINIC:  Medical Oncology/Hematology  PCP:  Coral Spikes, DO [place] [address] [phone]   REASON FOR VISIT:  Follow-up for left iliac lytic lesion.   INTERVAL HISTORY:  Ms. Hun 87 y.o. female seen for follow-up of left iliac lytic lesion.  She was initially seen by me on 08/09/2022 at the request of Dr. Lacinda Axon when x-ray of the left hip showed lytic lesion in the left iliac region.  She was then hospitalized for severe hyponatremia.  She is feeling better in terms of mentation.  Energy levels are also improving and rated at 75%.  Chronic numbness and tingling in the feet has been stable.  She is accompanied by her son and daughter-in-law.    REVIEW OF SYSTEMS:  Review of Systems  Neurological:  Positive for numbness (Feet).  All other systems reviewed and are negative.    PAST MEDICAL/SURGICAL HISTORY:  Past Medical History:  Diagnosis Date   Hypertension    Reflux    cholesterol, hyn   Past Surgical History:  Procedure Laterality Date   INTRAMEDULLARY (IM) NAIL INTERTROCHANTERIC Right 12/16/2016   Procedure: INTRAMEDULLARY (IM) NAIL INTERTROCHANTRIC;  Surgeon: Carole Civil, MD;  Location: [facility];  Service: Orthopedics;  Laterality: Right;   JOINT REPLACEMENT       SOCIAL HISTORY:  Social History   Socioeconomic History   Marital status: Widowed    Spouse name: Not on file   Number of children: 1   Years of education: Not on file   Highest education level: Not on file  Occupational History   Occupation: retired  Tobacco Use   Smoking status: Never   Smokeless tobacco: Never  Substance and Sexual Activity   Alcohol use: No   Drug use: No   Sexual activity: Not Currently  Other Topics Concern   Not on file  Social History Narrative   Widowed since 2018   1 son, lives in [place]   2 grandchildren   1 great granddaughter.   Social Determinants of  Health   Financial Resource Strain: Low Risk  (09/12/2021)   Overall Financial Resource Strain (CARDIA)    Difficulty of Paying Living Expenses: Not hard at all  Food Insecurity: No Food Insecurity (08/10/2022)   Hunger Vital Sign    Worried About Running Out of Food in the Last Year: Never true    Ran Out of Food in the Last Year: Never true  Transportation Needs: No Transportation Needs (08/10/2022)   PRAPARE - Hydrologist (Medical): No    Lack of Transportation (Non-Medical): No  Physical Activity: Sufficiently Active (09/12/2021)   Exercise Vital Sign    Days of Exercise per Week: 5 days    Minutes of Exercise per Session: 30 min  Stress: No Stress Concern Present (09/12/2021)   [place]    Feeling of Stress : Not at all  Social Connections: Moderately Integrated (09/12/2021)   Social Connection and Isolation Panel [NHANES]    Frequency of Communication with Friends and Family: More than three times a week    Frequency of Social Gatherings with Friends and Family: Once a week    Attends Religious Services: 1 to 4 times per year    Active Member of [employer] or Organizations: No    Attends Archivist Meetings: 1 to 4 times  per year    Marital Status: Widowed  Intimate Partner Violence: Not At Risk (08/10/2022)   Humiliation, Afraid, Rape, and Kick questionnaire    Fear of Current or Ex-Partner: No    Emotionally Abused: No    Physically Abused: No    Sexually Abused: No    FAMILY HISTORY:  No family history on file.  CURRENT MEDICATIONS:  Outpatient Encounter Medications as of 08/21/2022  Medication Sig   acetaminophen (TYLENOL) 325 MG tablet Take 2 tablets (650 mg total) by mouth every 6 (six) hours.   atorvastatin (LIPITOR) 10 MG tablet Take 10 mg by mouth daily.   calcium carbonate (OS-CAL) 1250 (500 Ca) MG chewable tablet Chew 1 tablet by mouth daily.   cetirizine (ZYRTEC)  10 MG tablet Take 10 mg by mouth daily as needed for allergies.   Cholecalciferol (VITAMIN D3) 25 MCG (1000 UT) CAPS Take 1 capsule by mouth daily.   furosemide (LASIX) 20 MG tablet Take 1 tablet (20 mg total) by mouth daily.   Magnesium Hydroxide (DULCOLAX SOFT CHEWS) 1200 MG CHEW Chew 1 tablet by mouth daily.   Multiple Vitamin (MULTIVITAMIN) capsule Take 1 capsule by mouth daily.   Nebivolol HCl 20 MG TABS Take one tablet po every day   pantoprazole (PROTONIX) 40 MG tablet TAKE 1 TABLET BY MOUTH EVERY DAY   polyethylene glycol (MIRALAX / GLYCOLAX) 17 g packet Take 17 g by mouth daily.   senna (SENOKOT) 8.6 MG TABS tablet Take 2 tablets (17.2 mg total) by mouth daily.   traMADol (ULTRAM) 50 MG tablet Take 1 tablet (50 mg total) by mouth every 12 (twelve) hours as needed for moderate pain.   No facility-administered encounter medications on file as of 08/21/2022.    ALLERGIES:  Allergies  Allergen Reactions   Prednisone Palpitations     PHYSICAL EXAM:  ECOG Performance status: 1  Vitals:   08/21/22 1425  BP: 126/62  Pulse: 85  Resp: 16  Temp: (!) 97.5 F (36.4 C)  SpO2: 95%   There were no vitals filed for this visit. Physical Exam Vitals reviewed.  Constitutional:      Appearance: Normal appearance.  Cardiovascular:     Rate and Rhythm: Normal rate and regular rhythm.     Heart sounds: Normal heart sounds.  Pulmonary:     Effort: Pulmonary effort is normal.     Breath sounds: Normal breath sounds.  Neurological:     Mental Status: She is alert.  Psychiatric:        Mood and Affect: Mood normal.        Behavior: Behavior normal.      LABORATORY DATA:  I have reviewed the labs as listed.  CBC    Component Value Date/Time   WBC 9.3 08/10/2022 0415   RBC 3.79 (L) 08/10/2022 0415   HGB 12.3 08/10/2022 0415   HGB 12.1 07/23/2022 1123   HCT 34.2 (L) 08/10/2022 0415   HCT 34.9 07/23/2022 1123   PLT 223 08/10/2022 0415   PLT 245 07/23/2022 1123   MCV 90.2  08/10/2022 0415   MCV 95 07/23/2022 1123   MCH 32.5 08/10/2022 0415   MCHC 36.0 08/10/2022 0415   RDW 12.2 08/10/2022 0415   RDW 12.4 07/23/2022 1123   LYMPHSABS 1.6 08/09/2022 1811   MONOABS 0.5 08/09/2022 1811   EOSABS 0.0 08/09/2022 1811   BASOSABS 0.0 08/09/2022 1811      Latest Ref Rng & Units 08/20/2022    8:00 AM 08/13/2022  5:34 AM 08/12/2022    4:52 AM  CMP  Glucose 70 - 99 mg/dL 119  108  104   BUN 8 - 23 mg/dL 15  8  10    Creatinine 0.44 - 1.00 mg/dL 0.82  0.77  0.67   Sodium 135 - 145 mmol/L 133  127  127   Potassium 3.5 - 5.1 mmol/L 3.8  3.6  3.8   Chloride 98 - 111 mmol/L 97  94  96   CO2 22 - 32 mmol/L 26  22  21    Calcium 8.9 - 10.3 mg/dL 9.2  9.0  8.8     DIAGNOSTIC IMAGING:  I have independently reviewed the scans and discussed with the patient.  ASSESSMENT: 1.  Left hip lytic lesion and pain: - Left hip pain started on 07/20/2021, achy, radiating to the left knee, progressively was on walking, relieved when lying in a recliner with her feet up. - Left hip x-ray (07/30/2022): 1.9 cm lytic lucency in the left ilium not seen in 2018. - 08/01/2022: SPEP-negative, FLC normal, immunofixation negative.  24-hour urine was also negative. - She is currently on extra strength Tylenol and tramadol 50 mg as needed. - Denies any weight loss.  Did not have mammograms in the last decade.  Did not have colonoscopy in her lifetime.   2.  Social/family history: - She lives by herself at home and was independent of ADLs and IADLs until 10 days ago.  Her daughter-in-law Jackelyn Poling moved in with her 10 days ago to help her day-to-day activities.  She is a retired Customer service manager.  She was a never smoker and nonalcoholic. - Sister had brain cancer.   PLAN:  1.  Left hip lytic lesion: - Previous multiple myeloma workup is negative. - I have reviewed the MRI of the left hip from 08/10/2022 which did not show any bone abnormalities.  No bone lesions in the left iliac wing.  Mild osteoarthritis of  the left hip and trace left hip effusion. - Her pain is also getting better.  She is not requiring any pain medication. - I have reviewed blood work including LDH and tumor markers which were normal.  I do not recommend any further workup. - I will be happy to see her on an as-needed basis.      Orders placed this encounter:  No orders of the defined types were placed in this encounter.     Derek Jack, MD [place] [phone]

## 2022-08-21 NOTE — Progress Notes (Signed)
NURSING HOME LOCATION:  [facility] ROOM NUMBER:  136 P  CODE STATUS:  DNR  PCP: Everlene Other DO  This is a comprehensive admission note to this SNFperformed on this date less than 30 days from date of admission. Included are preadmission medical/surgical history; reconciled medication list; family history; social history and comprehensive review of systems.  Corrections and additions to the records were documented. Comprehensive physical exam was also performed. Additionally a clinical summary was entered for each active diagnosis pertinent to this admission in the Problem List to enhance continuity of care.  HPI: She was hospitalized 1/25 - 08/13/2022 presented to the ED with approximately 3-week history of left hip and left thigh pain with radiation to the left knee.  The pain was worse supine in bed & she would position himself in the living room with legs elevated.  Tylenol and tramadol did not provide adequate relief.  Daughter-in-law who had been staying with her for approximately 10 days PTA stated that she had been more confused the day of admission. Admission sodium was 118 and normal saline was initiated.  Nephrology consulted to help manage the hyponatremia. Acute metabolic encephalopathy did improve with improvement in the sodium.  CT of the brain was negative.  B12, TSH, and folate were therapeutic or normal. Fluid intake was to be limited to 1.5 L/day.  Furosemide 20 mg daily was to be continued.  The combination ARB/HCTZ was discontinued.  With therapy the sodium did rise to 133.  Mild anemia was present with H/H of 12.3/34.2. Oncologist Dr. Ellin Saba saw her on 1/25 to evaluate a possible lytic lesion in the left ilium on hip imaging 1/15; but.  MRI of the left hip 1/26 revealed no acute osseous abnormality.  Mild tendinosis of the left gluteus medius and minimus tendinosis were present.  Screening labs for multiple myeloma were negative. PT/OT recommended SNF  placement for rehab.  Past medical and surgical history: Essential hypertension, mixed hyperlipidemia, GERD, obesity, and osteopeniaSurgeries and procedures include intramedullary intertrochanteric nail placement.  Social history: Nondrinker, non-smoker.  She is retired Psychologist, occupational. . Family history: Noncontributory due to advanced age.   Review of systems: Initially she stated that "they did not find anything and just did  x-ray after x-ray."  She did validate that "my sodium was low."  She does realize that she has a oncologic follow-up but had no idea for what reason.  She states she has been unable to sleep at the SNF due to pain in the left hip which radiates to the knee and pruritus, especially of the left anterior thigh.  The LLE pain is variable in character described as both sharp, dull, and burning.  She describes tingling in her feet at night.  She describes pain and redness at the site of an ingrown toenail of the great toe.  Constitutional: No fever, significant weight change, fatigue  Eyes: No redness, discharge, pain, vision change ENT/mouth: No nasal congestion, purulent discharge, earache, change in hearing, sore throat  Cardiovascular: No chest pain, palpitations, paroxysmal nocturnal dyspnea, claudication, edema  Respiratory: No cough, sputum production, hemoptysis, DOE, significant snoring, apnea Gastrointestinal: No heartburn, dysphagia, abdominal pain, nausea /vomiting, rectal bleeding, melena, change in bowels Genitourinary: No dysuria, hematuria, pyuria, incontinence, nocturia Dermatologic: No rash, pruritus, change in appearance of skin Neurologic: No dizziness, headache, syncope, seizures Psychiatric: No significant anxiety, depression, anorexia Endocrine: No change in hair/skin/nails, excessive thirst, excessive hunger, excessive urination  Hematologic/lymphatic: No significant bruising, lymphadenopathy, abnormal bleeding Allergy/immunology:  No itchy/watery eyes,  significant sneezing, urticaria, angioedema  Physical exam:  Pertinent or positive findings: Eyebrows are absent.  Abdomen slightly protuberant.  Pedal pulses are decreased.  She has trace-1/2+ edema at the sock line.  She has isolated DIP arthritic changes of the hands.  The right lower extremity is stronger than the left lower extremity to opposition.  There is isolated bruising over the right forearm.  There is erythema at the margin of the ingrown toenail.  This area does not blanch to pressure.  There is no rash over the left anterior thigh but dermatographia can be elicited.  She has minor faint erythema and isolated distribution over the upper extremity which does blanch to pressure.  General appearance: Adequately nourished; no acute distress, increased work of breathing is present.   Lymphatic: No lymphadenopathy about the head, neck, axilla. Eyes: No conjunctival inflammation or lid edema is present. There is no scleral icterus. Ears:  External ear exam shows no significant lesions or deformities.   Nose:  External nasal examination shows no deformity or inflammation. Nasal mucosa are pink and moist without lesions, exudates Oral exam: Lips and gums are healthy appearing.There is no oropharyngeal erythema or exudate. Neck:  No thyromegaly, masses, tenderness noted.    Heart:  Normal rate and regular rhythm. S1 and S2 normal without gallop, murmur, click, rub.  Lungs: Chest clear to auscultation without wheezes, rhonchi, rales, rubs. Abdomen: Bowel sounds are normal.  Abdomen is soft and nontender with no organomegaly, hernias, masses. GU: Deferred  Extremities:  No cyanosis, clubbing Neurologic exam:  Balance, Rhomberg, finger to nose testing could not be completed due to clinical state Skin: Warm & dry w/o tenting.  See clinical summary under each active problem in the Problem List with associated updated therapeutic plan

## 2022-08-22 ENCOUNTER — Encounter: Payer: Self-pay | Admitting: Internal Medicine

## 2022-08-22 NOTE — Assessment & Plan Note (Signed)
Monitor sodium off HCTZ.

## 2022-08-22 NOTE — Assessment & Plan Note (Signed)
Patient informed. 

## 2022-08-22 NOTE — Assessment & Plan Note (Signed)
Systolic blood pressure is mildly elevated off the HCTZ.  Blood pressure average will be determined and antihypertensive adjusted appropriately.

## 2022-08-22 NOTE — Patient Instructions (Signed)
See assessment and plan under each diagnosis in the problem list and acutely for this visit 

## 2022-08-22 NOTE — Assessment & Plan Note (Addendum)
Imaging suggested a possible left ileal lytic lesion but MRI revealed this was been overlying bowel gas.  MRI did reveal mild tendinosis of the left gluteus medius and minimus tendinosis. If the LLE pain persists or progresses; topical Voltaren gel could be initiated.  PT/OT as tolerated.

## 2022-08-22 NOTE — Assessment & Plan Note (Signed)
Because of the encephalopathy in the context of marked hyponatremia she has little comprehension of hospitalization details.  At this time she appears to be alert and oriented.

## 2022-08-23 DIAGNOSIS — L299 Pruritus, unspecified: Secondary | ICD-10-CM | POA: Insufficient documentation

## 2022-08-23 DIAGNOSIS — L03039 Cellulitis of unspecified toe: Secondary | ICD-10-CM | POA: Insufficient documentation

## 2022-08-23 NOTE — Assessment & Plan Note (Signed)
This is a recurrent issue.  Topical generic Bactroban will be applied twice daily.  Podiatry follow-up to be scheduled.

## 2022-08-23 NOTE — Assessment & Plan Note (Signed)
She describes pruritus diffusely but especially of the left anterior thigh.  No significant visible lesions are present but dermatographia can be elicited by stroking the left anterior thigh.  The sedating antihistamine Zyrtec will be changed to fexofenadine 180 mg daily.  Topical Cortaid to the left anterior thigh as needed was ordered.  Dermatology follow-up will be pursued.

## 2022-08-27 ENCOUNTER — Non-Acute Institutional Stay (SKILLED_NURSING_FACILITY): Payer: Medicare Other | Admitting: Adult Health

## 2022-08-27 ENCOUNTER — Encounter: Payer: Self-pay | Admitting: Adult Health

## 2022-08-27 DIAGNOSIS — G9341 Metabolic encephalopathy: Secondary | ICD-10-CM | POA: Diagnosis not present

## 2022-08-27 DIAGNOSIS — E871 Hypo-osmolality and hyponatremia: Secondary | ICD-10-CM | POA: Diagnosis not present

## 2022-08-27 DIAGNOSIS — M7602 Gluteal tendinitis, left hip: Secondary | ICD-10-CM | POA: Diagnosis not present

## 2022-08-27 DIAGNOSIS — I700 Atherosclerosis of aorta: Secondary | ICD-10-CM

## 2022-08-27 NOTE — Progress Notes (Unsigned)
Location:  [facility] Room Number: 136 Place of Service:  SNF (31)   CODE STATUS: full   Allergies  Allergen Reactions   Prednisone Palpitations    Chief Complaint  Patient presents with   Discharge Note    HPI:  She is being discharged to assisted living  with home health for pt/ot.  The assisted living facility will provide her medications and medical follow up. She will not need any dme. She had been hospitalized for electrolyte disturbances. She was admitted to this facility for short term rehab. She has participated pt/ot to improve upon her level of independence with her adls. She is ready for assisted living.    Past Medical History:  Diagnosis Date   Hypertension    Reflux    cholesterol, hyn    Past Surgical History:  Procedure Laterality Date   INTRAMEDULLARY (IM) NAIL INTERTROCHANTERIC Right 12/16/2016   Procedure: INTRAMEDULLARY (IM) NAIL INTERTROCHANTRIC;  Surgeon: Carole Civil, MD;  Location: [facility];  Service: Orthopedics;  Laterality: Right;   JOINT REPLACEMENT      Social History   Socioeconomic History   Marital status: Widowed    Spouse name: Not on file   Number of children: 1   Years of education: Not on file   Highest education level: Not on file  Occupational History   Occupation: retired  Tobacco Use   Smoking status: Never   Smokeless tobacco: Never  Substance and Sexual Activity   Alcohol use: No   Drug use: No   Sexual activity: Not Currently  Other Topics Concern   Not on file  Social History Narrative   Widowed since 2018   1 son, lives in [place]   2 grandchildren   1 great granddaughter.   Social Determinants of Health   Financial Resource Strain: Low Risk  (09/12/2021)   Overall Financial Resource Strain (CARDIA)    Difficulty of Paying Living Expenses: Not hard at all  Food Insecurity: No Food Insecurity (08/10/2022)   Hunger Vital Sign    Worried About Running Out of Food in the Last  Year: Never true    Ran Out of Food in the Last Year: Never true  Transportation Needs: No Transportation Needs (08/10/2022)   PRAPARE - Hydrologist (Medical): No    Lack of Transportation (Non-Medical): No  Physical Activity: Sufficiently Active (09/12/2021)   Exercise Vital Sign    Days of Exercise per Week: 5 days    Minutes of Exercise per Session: 30 min  Stress: No Stress Concern Present (09/12/2021)   [place]    Feeling of Stress : Not at all  Social Connections: Moderately Integrated (09/12/2021)   Social Connection and Isolation Panel [NHANES]    Frequency of Communication with Friends and Family: More than three times a week    Frequency of Social Gatherings with Friends and Family: Once a week    Attends Religious Services: 1 to 4 times per year    Active Member of [employer] or Organizations: No    Attends Archivist Meetings: 1 to 4 times per year    Marital Status: Widowed  Intimate Partner Violence: Not At Risk (08/10/2022)   Humiliation, Afraid, Rape, and Kick questionnaire    Fear of Current or Ex-Partner: No    Emotionally Abused: No    Physically Abused: No    Sexually Abused: No  No family history on file.    VITAL SIGNS BP 123/79   Pulse 84   Temp 97.9 F (36.6 C)   Resp 20   Ht 5' 6"$  (1.676 m)   Wt 160 lb 9.6 oz (72.8 kg)   SpO2 100%   BMI 25.92 kg/m   Outpatient Encounter Medications as of 08/27/2022  Medication Sig   acetaminophen (TYLENOL) 325 MG tablet Take 2 tablets (650 mg total) by mouth every 6 (six) hours.   atorvastatin (LIPITOR) 10 MG tablet Take 10 mg by mouth daily.   calcium carbonate (OS-CAL) 1250 (500 Ca) MG chewable tablet Chew 1 tablet by mouth daily.   cetirizine (ZYRTEC) 10 MG tablet Take 10 mg by mouth daily as needed for allergies.   Cholecalciferol (VITAMIN D3) 25 MCG (1000 UT) CAPS Take 1 capsule by mouth daily.   furosemide  (LASIX) 20 MG tablet Take 1 tablet (20 mg total) by mouth daily.   Magnesium Hydroxide (DULCOLAX SOFT CHEWS) 1200 MG CHEW Chew 1 tablet by mouth daily.   Multiple Vitamin (MULTIVITAMIN) capsule Take 1 capsule by mouth daily.   Nebivolol HCl 20 MG TABS Take one tablet po every day   pantoprazole (PROTONIX) 40 MG tablet TAKE 1 TABLET BY MOUTH EVERY DAY   polyethylene glycol (MIRALAX / GLYCOLAX) 17 g packet Take 17 g by mouth daily.   senna (SENOKOT) 8.6 MG TABS tablet Take 2 tablets (17.2 mg total) by mouth daily.   traMADol (ULTRAM) 50 MG tablet Take 1 tablet (50 mg total) by mouth every 12 (twelve) hours as needed for moderate pain.   No facility-administered encounter medications on file as of 08/27/2022.     SIGNIFICANT DIAGNOSTIC EXAMS  PREVIOUS   08-09-22: chest x-ray: No acute cardiopulmonary disease  08-09-22: left femur x-ray: There is no evidence of fracture or other focal bone lesions. Lucency seen in left ilium on prior radiograph is not well visualized currently, and potentially may have represented overlying bowel gas. Soft tissues are unremarkable.  08-09-22: ct of abdomen:  1. No acute localizing process in the abdomen or pelvis. 2. Bilateral renal cysts. No follow-up needed. Aortic Atherosclerosis  08-09-22: ct of left hip Degenerative changes. No specific lytic destructive process involving the iliac bone. If there is further concern a bone scan or MRI could be considered.  08-11-22: mri left hip  1. No acute osseous abnormality of the left hip or pelvis. No bone lesions with attention to the left iliac wing. Previously described radiographic abnormality represented overlying bowel gas. No further follow-up imaging is needed. 2. Mild osteoarthritis of the left hip. 3. Trace left hip joint effusion is likely reactive. 4. Mild tendinosis of the left gluteus medius and minimus tendons.  NO NEW EXAMS   LABS REVIEWED  PREVIOUS    08-09-22; wbc 7.3; hgb 12.9; hct 35.3; mcv  88.9 plt 302; glucose 138; bun 21; creat 0.99; k+ 3.7; na++ 118; ca 9.4 gfr 56; protein 7.9 albumin 4.5 urine osmolality 251  08-10-22; wbc 9.3; hgb 12.3; hct 34.2; mcv 90.2 plt 223; glucose 104; bun 18; creat 0.82; k+ 2.8; na++ 120; ca 9.1; gfr >60; protein 6.9 albumin 4.0; mag 1.8; phos 3.1; vitamin B 12: 755 folate 21.9; tsh 1.792; free t4: 1.53 08-12-22: chol 110 ldl 43; trig 91; hdl 49; Am cortisol 21.9 08-13-22: glucose 108; bun 8; creat 0.77; k+ 3.6; na++ 127; ca 9.0 gfr>60  NO NEW LABS.    Review of Systems  Constitutional:  Negative for malaise/fatigue.  Respiratory:  Negative for cough and shortness of breath.   Cardiovascular:  Negative for chest pain, palpitations and leg swelling.  Gastrointestinal:  Negative for abdominal pain, constipation and heartburn.  Musculoskeletal:  Negative for back pain, joint pain and myalgias.  Skin: Negative.   Neurological:  Negative for dizziness.  Psychiatric/Behavioral:  The patient is not nervous/anxious.     Physical Exam Constitutional:      General: She is not in acute distress.    Appearance: She is well-developed. She is not diaphoretic.  Neck:     Thyroid: No thyromegaly.  Cardiovascular:     Rate and Rhythm: Normal rate and regular rhythm.     Pulses: Normal pulses.     Heart sounds: Normal heart sounds.  Pulmonary:     Effort: Pulmonary effort is normal. No respiratory distress.     Breath sounds: Normal breath sounds.  Abdominal:     General: Bowel sounds are normal. There is no distension.     Palpations: Abdomen is soft.     Tenderness: There is no abdominal tenderness.  Musculoskeletal:        General: Normal range of motion.     Cervical back: Neck supple.     Right lower leg: No edema.     Left lower leg: No edema.  Lymphadenopathy:     Cervical: No cervical adenopathy.  Skin:    General: Skin is warm and dry.  Neurological:     Mental Status: She is alert.     Comments: Some mild disorientation present     Psychiatric:        Mood and Affect: Mood normal.       ASSESSMENT/ PLAN:   Patient is being discharged with the following home health services:  PT/OT to evaluate and treat as indicated for gait balance strength adl training   Patient is being discharged with the following durable medical equipment:  none needed   Patient has been advised to f/u with their PCP in 1-2 weeks to for a transitions of care visit.  Social services at their facility was responsible for arranging this appointment.  Pt was provided with adequate prescriptions of noncontrolled medications to reach the scheduled appointment .  For controlled substances, a limited supply was provided as appropriate for the individual patient.  If the pt normally receives these medications from a pain clinic or has a contract with another physician, these medications should be received from that clinic or physician only).   The assisted living facility will provide her medications   Ok Edwards NP [facility]  call [phone]

## 2022-09-05 ENCOUNTER — Ambulatory Visit: Payer: Medicare Other | Admitting: Family Medicine

## 2022-09-05 VITALS — BP 115/78 | HR 82 | Temp 98.6°F | Ht 66.0 in | Wt 158.0 lb

## 2022-09-05 DIAGNOSIS — I10 Essential (primary) hypertension: Secondary | ICD-10-CM

## 2022-09-05 DIAGNOSIS — E119 Type 2 diabetes mellitus without complications: Secondary | ICD-10-CM

## 2022-09-05 DIAGNOSIS — G2581 Restless legs syndrome: Secondary | ICD-10-CM

## 2022-09-05 MED ORDER — ROPINIROLE HCL 0.25 MG PO TABS: 0.2500 mg | ORAL_TABLET | Freq: Every day | ORAL | 3 refills | Status: DC

## 2022-09-05 NOTE — Patient Instructions (Addendum)
Medication as prescribed.  Follow up in 3 months.

## 2022-09-06 DIAGNOSIS — G2581 Restless legs syndrome: Secondary | ICD-10-CM | POA: Insufficient documentation

## 2022-09-06 NOTE — Assessment & Plan Note (Signed)
Trial of Requip.

## 2022-09-06 NOTE — Assessment & Plan Note (Signed)
Stable.  Will continue monitor.

## 2022-09-06 NOTE — Progress Notes (Signed)
Subjective:  Patient ID: Julia Choi, female    DOB: 07/20/1935  Age: 87 y.o. MRN: [id number]  CC: Chief Complaint  Patient presents with   Hospitalization Follow-up    Hosp FU pulled muscle in leg and hyponatremia, DNR form req. Now living at [facility]    trouble sleeping    Facility needs Rx for melatonin , restless legs reported    HPI:  87 year old female presents for follow-up.  Patient has been doing well since getting out of the hospital.  She is now residing in assisted living.  Blood pressure is well-controlled.  Previous medications have been changed due to recent hospitalization of hyponatremia.  Patient is currently on nebivolol and Lasix.  Will need to be very careful with use of Lasix.   Type 2 diabetes well-controlled.  Last A1c 6.5.  No meds at this time.  Workup for lytic lesion has been negative.  Patient reports that overall she feels like she is doing well.  She does report that she is having issues with sleeping at night.  She reports discomfort in her legs and feet.  Suspected restless leg.  She is currently using melatonin but this has not helped significantly.  She would like to discuss this today.  Patient Active Problem List   Diagnosis Date Noted   RLS (restless legs syndrome) 09/06/2022   Aortic atherosclerosis ([place]) 08/14/2022   Osteoarthritis of left hip 08/14/2022   Lytic bone lesion of hip 08/09/2022   Hyponatremia 08/09/2022   Obesity (BMI 30-39.9) 08/09/2022   Type 2 diabetes mellitus ([place]) 01/19/2022   Osteopenia 123456   Systolic murmur 123456   GERD (gastroesophageal reflux disease) 12/28/2020   Essential hypertension 12/15/2016   Mixed hyperlipidemia 12/15/2016    Social Hx   Social History   Socioeconomic History   Marital status: Widowed    Spouse name: Not on file   Number of children: 1   Years of education: Not on file   Highest education level: Not on file  Occupational History   Occupation:  retired  Tobacco Use   Smoking status: Never   Smokeless tobacco: Never  Substance and Sexual Activity   Alcohol use: No   Drug use: No   Sexual activity: Not Currently  Other Topics Concern   Not on file  Social History Narrative   Widowed since 2018   1 son, lives in [place]   2 grandchildren   1 great granddaughter.   Social Determinants of Health   Financial Resource Strain: Low Risk  (09/12/2021)   Overall Financial Resource Strain (CARDIA)    Difficulty of Paying Living Expenses: Not hard at all  Food Insecurity: No Food Insecurity (08/10/2022)   Hunger Vital Sign    Worried About Running Out of Food in the Last Year: Never true    Ran Out of Food in the Last Year: Never true  Transportation Needs: No Transportation Needs (08/10/2022)   PRAPARE - Hydrologist (Medical): No    Lack of Transportation (Non-Medical): No  Physical Activity: Sufficiently Active (09/12/2021)   Exercise Vital Sign    Days of Exercise per Week: 5 days    Minutes of Exercise per Session: 30 min  Stress: No Stress Concern Present (09/12/2021)   [place]    Feeling of Stress : Not at all  Social Connections: Moderately Integrated (09/12/2021)   Social Connection and Isolation Panel [NHANES]  Frequency of Communication with Friends and Family: More than three times a week    Frequency of Social Gatherings with Friends and Family: Once a week    Attends Religious Services: 1 to 4 times per year    Active Member of [employer] or Organizations: No    Attends Archivist Meetings: 1 to 4 times per year    Marital Status: Widowed    Review of Systems Per HPI  Objective:  BP 115/78   Pulse 82   Temp 98.6 F (37 C)   Ht 5' 6"$  (1.676 m)   Wt 158 lb (71.7 kg)   SpO2 100%   BMI 25.50 kg/m      09/05/2022   10:51 AM 08/27/2022    1:57 PM 08/21/2022    2:25 PM  BP/Weight  Systolic BP [id number]  [id number]  Diastolic BP 78 79 62  Wt. (Lbs) 158 160.6   BMI 25.5 kg/m2 25.92 kg/m2     Physical Exam Vitals and nursing note reviewed.  Constitutional:      General: She is not in acute distress.    Appearance: Normal appearance.  HENT:     Head: Normocephalic and atraumatic.  Eyes:     General:        Right eye: No discharge.        Left eye: No discharge.     Conjunctiva/sclera: Conjunctivae normal.  Cardiovascular:     Rate and Rhythm: Normal rate and regular rhythm.     Heart sounds: Murmur heard.  Pulmonary:     Effort: Pulmonary effort is normal.     Breath sounds: Normal breath sounds. No wheezing, rhonchi or rales.  Neurological:     Mental Status: She is alert.     Lab Results  Component Value Date   WBC 9.3 08/10/2022   HGB 12.3 08/10/2022   HCT 34.2 (L) 08/10/2022   PLT 223 08/10/2022   GLUCOSE 119 (H) 08/20/2022   CHOL 110 08/12/2022   TRIG 91 08/12/2022   HDL 49 08/12/2022   LDLCALC 43 08/12/2022   ALT 24 08/10/2022   AST 26 08/10/2022   NA 133 (L) 08/20/2022   K 3.8 08/20/2022   CL 97 (L) 08/20/2022   CREATININE 0.82 08/20/2022   BUN 15 08/20/2022   CO2 26 08/20/2022   TSH 1.792 08/10/2022   INR 1.15 12/15/2016   HGBA1C 6.5 (H) 07/23/2022     Assessment & Plan:   Problem List Items Addressed This Visit       Cardiovascular and Mediastinum   Essential hypertension - Primary    Stable on current medications.  Will need to monitor metabolic panel frequently due to prior hyponatremia and current use of Lasix.        Endocrine   Type 2 diabetes mellitus (HCC)    Stable.  Will continue monitor.        Other   RLS (restless legs syndrome)    Trial of Requip.       Meds ordered this encounter  Medications   DISCONTD: rOPINIRole (REQUIP) 0.25 MG tablet    Sig: Take 1 tablet (0.25 mg total) by mouth at bedtime.    Dispense:  30 tablet    Refill:  3   rOPINIRole (REQUIP) 0.25 MG tablet    Sig: Take 1 tablet (0.25 mg total) by mouth at  bedtime.    Dispense:  30 tablet    Refill:  3    Follow-up:  Return  in about 3 months (around 12/04/2022).  [place]

## 2022-09-06 NOTE — Assessment & Plan Note (Signed)
Stable on current medications.  Will need to monitor metabolic panel frequently due to prior hyponatremia and current use of Lasix.

## 2022-10-11 ENCOUNTER — Other Ambulatory Visit: Payer: Self-pay | Admitting: Family Medicine

## 2022-10-26 ENCOUNTER — Other Ambulatory Visit: Payer: Self-pay | Admitting: Family Medicine

## 2022-12-04 ENCOUNTER — Ambulatory Visit: Payer: Medicare Other | Admitting: Family Medicine

## 2022-12-04 ENCOUNTER — Encounter: Payer: Self-pay | Admitting: Family Medicine

## 2022-12-04 VITALS — BP 138/76 | HR 70 | Temp 97.7°F | Ht 66.0 in | Wt 169.0 lb

## 2022-12-04 DIAGNOSIS — Z130 Encounter for screening for diseases of the blood and blood-forming organs and certain disorders involving the immune mechanism: Secondary | ICD-10-CM | POA: Diagnosis not present

## 2022-12-04 DIAGNOSIS — M7989 Other specified soft tissue disorders: Secondary | ICD-10-CM | POA: Diagnosis not present

## 2022-12-04 DIAGNOSIS — E119 Type 2 diabetes mellitus without complications: Secondary | ICD-10-CM | POA: Diagnosis not present

## 2022-12-04 DIAGNOSIS — I10 Essential (primary) hypertension: Secondary | ICD-10-CM

## 2022-12-04 DIAGNOSIS — G2581 Restless legs syndrome: Secondary | ICD-10-CM

## 2022-12-04 MED ORDER — ROPINIROLE HCL 0.25 MG PO TABS: 0.2500 mg | ORAL_TABLET | Freq: Every day | ORAL | 3 refills | Status: DC

## 2022-12-04 NOTE — Patient Instructions (Signed)
Labs today.  Continue your medications.  Follow up in 3 months.

## 2022-12-05 ENCOUNTER — Telehealth: Payer: Self-pay

## 2022-12-05 DIAGNOSIS — M7989 Other specified soft tissue disorders: Secondary | ICD-10-CM | POA: Insufficient documentation

## 2022-12-05 LAB — CBC
Hematocrit: 35.6 % (ref 34.0–46.6)
Hemoglobin: 11.5 g/dL (ref 11.1–15.9)
MCH: 31 pg (ref 26.6–33.0)
MCHC: 32.3 g/dL (ref 31.5–35.7)
MCV: 96 fL (ref 79–97)
Platelets: 181 10*3/uL (ref 150–450)
RBC: 3.71 x10E6/uL — ABNORMAL LOW (ref 3.77–5.28)
RDW: 11.7 % (ref 11.7–15.4)
WBC: 5.6 10*3/uL (ref 3.4–10.8)

## 2022-12-05 LAB — CMP14+EGFR
ALT: 15 IU/L (ref 0–32)
AST: 20 IU/L (ref 0–40)
Albumin/Globulin Ratio: 1.7 (ref 1.2–2.2)
Albumin: 4.4 g/dL (ref 3.7–4.7)
Alkaline Phosphatase: 87 IU/L (ref 44–121)
BUN/Creatinine Ratio: 16 (ref 12–28)
BUN: 13 mg/dL (ref 8–27)
Bilirubin Total: 0.6 mg/dL (ref 0.0–1.2)
CO2: 27 mmol/L (ref 20–29)
Calcium: 9.5 mg/dL (ref 8.7–10.3)
Chloride: 99 mmol/L (ref 96–106)
Creatinine, Ser: 0.8 mg/dL (ref 0.57–1.00)
Globulin, Total: 2.6 g/dL (ref 1.5–4.5)
Glucose: 106 mg/dL — ABNORMAL HIGH (ref 70–99)
Potassium: 4 mmol/L (ref 3.5–5.2)
Sodium: 140 mmol/L (ref 134–144)
Total Protein: 7 g/dL (ref 6.0–8.5)
eGFR: 73 mL/min/{1.73_m2} (ref >59)

## 2022-12-05 LAB — D-DIMER, QUANTITATIVE: D-DIMER: 0.38 mg/L FEU (ref 0.00–0.49)

## 2022-12-05 LAB — HEMOGLOBIN A1C
Est. average glucose Bld gHb Est-mCnc: 131 mg/dL
Hgb A1c MFr Bld: 6.2 % — ABNORMAL HIGH (ref 4.8–5.6)

## 2022-12-05 NOTE — Assessment & Plan Note (Signed)
Based on exam, I do not suspect DVT.  Likely due to underlying venous stasis.  D-dimer negative.

## 2022-12-05 NOTE — Progress Notes (Signed)
Subjective:  Patient ID: Julia Choi, female    DOB: October 07, 1935  Age: 87 y.o. MRN: [id number]  CC: Chief Complaint  Patient presents with   Hypertension    Follow up   trouble walking    [facility]    Medication Refill    Written rx for requip    HPI:  87 year old female with hypertension, GERD, type 2 diabetes, osteopenia, osteoarthritis, hyperlipidemia presents for follow-up  Patient reports that she is doing well.  She is not having any pain at this time.  She does note, however, that she is having  She states that she needs a refill on her medication for restless leg.  Patient's hypertension is well-controlled current on Lasix and Nebivolol.   Patient also reports that she has had swelling to her right lower extremity.  No significant pain.  She has swelling to the left lower extremity but the right is greater.   Patient Active Problem List   Diagnosis Date Noted   Right leg swelling 12/05/2022   RLS (restless legs syndrome) 09/06/2022   Aortic atherosclerosis (HCC) 08/14/2022   Osteoarthritis of left hip 08/14/2022   Obesity (BMI 30-39.9) 08/09/2022   Type 2 diabetes mellitus (HCC) 01/19/2022   Osteopenia 07/21/2021   Systolic murmur 07/21/2021   GERD (gastroesophageal reflux disease) 12/28/2020   Essential hypertension 12/15/2016   Mixed hyperlipidemia 12/15/2016    Social Hx   Social History   Socioeconomic History   Marital status: Widowed    Spouse name: Not on file   Number of children: 1   Years of education: Not on file   Highest education level: 12th grade  Occupational History   Occupation: retired  Tobacco Use   Smoking status: Never   Smokeless tobacco: Never  Substance and Sexual Activity   Alcohol use: No   Drug use: No   Sexual activity: Not Currently  Other Topics Concern   Not on file  Social History Narrative   Widowed since 2018   1 son, lives in [place]   2 grandchildren   1  great granddaughter.   Social Determinants of Health   Financial Resource Strain: Low Risk  (11/30/2022)   Overall Financial Resource Strain (CARDIA)    Difficulty of Paying Living Expenses: Not hard at all  Food Insecurity: No Food Insecurity (11/30/2022)   Hunger Vital Sign    Worried About Running Out of Food in the Last Year: Never true    Ran Out of Food in the Last Year: Never true  Transportation Needs: No Transportation Needs (11/30/2022)   PRAPARE - Administrator, Civil Service (Medical): No    Lack of Transportation (Non-Medical): No  Physical Activity: Sufficiently Active (11/30/2022)   Exercise Vital Sign    Days of Exercise per Week: 5 days    Minutes of Exercise per Session: 30 min  Stress: Stress Concern Present (11/30/2022)   [employer] of Occupational Health - Occupational Stress Questionnaire    Feeling of Stress : To some extent  Social Connections: Moderately Integrated (11/30/2022)   Social Connection and Isolation Panel [NHANES]    Frequency of Communication with Friends and Family: More than three times a week    Frequency of Social Gatherings with Friends and Family: Once a week    Attends Religious Services: More than 4 times per year    Active Member of [employer] or Organizations: Yes    Attends Banker Meetings:  More than 4 times per year    Marital Status: Widowed    Review of Systems Per HPI  Objective:  BP 138/76   Pulse 70   Temp 97.7 F (36.5 C)   Ht 5\' 6"  (1.676 m)   Wt 169 lb (76.7 kg)   SpO2 97%   BMI 27.28 kg/m      12/04/2022    1:47 PM 09/05/2022   10:51 AM 08/27/2022    1:57 PM  BP/Weight  Systolic BP 138 115 123  Diastolic BP 76 78 79  Wt. (Lbs) 169 158 160.6  BMI 27.28 kg/m2 25.5 kg/m2 25.92 kg/m2    Physical Exam Vitals and nursing note reviewed.  Constitutional:      General: She is not in acute distress.    Appearance: Normal appearance.  HENT:     Head: Normocephalic and atraumatic.   Cardiovascular:     Rate and Rhythm: Normal rate and regular rhythm.  Pulmonary:     Effort: Pulmonary effort is normal.     Breath sounds: Normal breath sounds. No wheezing or rales.  Musculoskeletal:     Comments: Edema noted of both lower extremities, right greater than left.  Neurological:     Mental Status: She is alert.  Psychiatric:        Mood and Affect: Mood normal.        Behavior: Behavior normal.     Lab Results  Component Value Date   WBC 5.6 12/04/2022   HGB 11.5 12/04/2022   HCT 35.6 12/04/2022   PLT 181 12/04/2022   GLUCOSE 106 (H) 12/04/2022   CHOL 110 08/12/2022   TRIG 91 08/12/2022   HDL 49 08/12/2022   LDLCALC 43 08/12/2022   ALT 15 12/04/2022   AST 20 12/04/2022   NA 140 12/04/2022   K 4.0 12/04/2022   CL 99 12/04/2022   CREATININE 0.80 12/04/2022   BUN 13 12/04/2022   CO2 27 12/04/2022   TSH 1.792 08/10/2022   INR 1.15 12/15/2016   HGBA1C 6.2 (H) 12/04/2022     Assessment & Plan:   Problem List Items Addressed This Visit       Cardiovascular and Mediastinum   Essential hypertension - Primary    Stable on nebivolol and Lasix.  Continue.        Endocrine   Type 2 diabetes mellitus (HCC)    A1c today to assess.      Relevant Orders   CMP14+EGFR (Completed)   Hemoglobin A1c (Completed)     Other   Right leg swelling    Based on exam, I do not suspect DVT.  Likely due to underlying venous stasis.  D-dimer negative.      Relevant Orders   D-dimer, quantitative (Completed)   RLS (restless legs syndrome)    Stable on Requip.  Refilled.      Other Visit Diagnoses     Screening for deficiency anemia       Relevant Orders   CBC (Completed)       Meds ordered this encounter  Medications   DISCONTD: rOPINIRole (REQUIP) 0.25 MG tablet    Sig: Take 1 tablet (0.25 mg total) by mouth at bedtime.    Dispense:  30 tablet    Refill:  3   DISCONTD: rOPINIRole (REQUIP) 0.25 MG tablet    Sig: Take 1 tablet (0.25 mg total) by  mouth at bedtime.    Dispense:  30 tablet    Refill:  3  rOPINIRole (REQUIP) 0.25 MG tablet    Sig: Take 1 tablet (0.25 mg total) by mouth at bedtime.    Dispense:  30 tablet    Refill:  3    Follow-up:  3 months  Syncere Kaminski Adriana Simas DO [facility]

## 2022-12-05 NOTE — Assessment & Plan Note (Signed)
Stable on nebivolol and Lasix.  Continue.

## 2022-12-05 NOTE — Assessment & Plan Note (Signed)
Stable on Requip.  Refilled.

## 2022-12-05 NOTE — Assessment & Plan Note (Signed)
A1c today to assess. 

## 2022-12-05 NOTE — Telephone Encounter (Signed)
Pt Son is calling back needing to talk with nurse about her blood work there is some questions on there he needed to clarify.   Jorja Loa- [phone]

## 2022-12-06 ENCOUNTER — Encounter: Payer: Self-pay | Admitting: Family Medicine

## 2022-12-30 ENCOUNTER — Other Ambulatory Visit: Payer: Self-pay | Admitting: Family Medicine

## 2023-01-16 ENCOUNTER — Encounter: Payer: Self-pay | Admitting: Family Medicine

## 2023-01-21 ENCOUNTER — Ambulatory Visit: Payer: Medicare Other | Admitting: Family Medicine

## 2023-02-06 ENCOUNTER — Ambulatory Visit: Payer: Medicare Other | Admitting: Family Medicine

## 2023-02-06 VITALS — BP 141/80 | HR 66 | Temp 98.1°F | Ht 66.0 in | Wt 170.0 lb

## 2023-02-06 DIAGNOSIS — E782 Mixed hyperlipidemia: Secondary | ICD-10-CM | POA: Diagnosis not present

## 2023-02-06 DIAGNOSIS — G2581 Restless legs syndrome: Secondary | ICD-10-CM

## 2023-02-06 DIAGNOSIS — E119 Type 2 diabetes mellitus without complications: Secondary | ICD-10-CM

## 2023-02-06 DIAGNOSIS — I10 Essential (primary) hypertension: Secondary | ICD-10-CM | POA: Diagnosis not present

## 2023-02-06 NOTE — Progress Notes (Unsigned)
Subjective:  Patient ID: Julia Choi, female    DOB: 1935/09/12  Age: 87 y.o. MRN: [id number]  CC: Chief Complaint  Patient presents with  . Hypertension    6 month follow up     HPI:  87 year old female with the below mentioned medical problems presents for follow-up.    Patient Active Problem List   Diagnosis Date Noted  . Right leg swelling 12/05/2022  . RLS (restless legs syndrome) 09/06/2022  . Aortic atherosclerosis (HCC) 08/14/2022  . Osteoarthritis of left hip 08/14/2022  . Obesity (BMI 30-39.9) 08/09/2022  . Type 2 diabetes mellitus (HCC) 01/19/2022  . Osteopenia 07/21/2021  . Systolic murmur 07/21/2021  . GERD (gastroesophageal reflux disease) 12/28/2020  . Essential hypertension 12/15/2016  . Mixed hyperlipidemia 12/15/2016    Social Hx   Social History   Socioeconomic History  . Marital status: Widowed    Spouse name: Not on file  . Number of children: 1  . Years of education: Not on file  . Highest education level: 12th grade  Occupational History  . Occupation: retired  Tobacco Use  . Smoking status: Never  . Smokeless tobacco: Never  Substance and Sexual Activity  . Alcohol use: No  . Drug use: No  . Sexual activity: Not Currently  Other Topics Concern  . Not on file  Social History Narrative   Widowed since 2018   1 son, lives in [place]   2 grandchildren   1 great granddaughter.   Social Determinants of Health   Financial Resource Strain: Low Risk  (11/30/2022)   Overall Financial Resource Strain (CARDIA)   . Difficulty of Paying Living Expenses: Not hard at all  Food Insecurity: No Food Insecurity (11/30/2022)   Hunger Vital Sign   . Worried About Programme researcher, broadcasting/film/video in the Last Year: Never true   . Ran Out of Food in the Last Year: Never true  Transportation Needs: No Transportation Needs (11/30/2022)   PRAPARE - Transportation   . Lack of Transportation (Medical): No   . Lack of Transportation (Non-Medical): No  Physical  Activity: Sufficiently Active (11/30/2022)   Exercise Vital Sign   . Days of Exercise per Week: 5 days   . Minutes of Exercise per Session: 30 min  Stress: Stress Concern Present (11/30/2022)   [employer] of Occupational Health - Occupational Stress Questionnaire   . Feeling of Stress : To some extent  Social Connections: Moderately Integrated (11/30/2022)   Social Connection and Isolation Panel [NHANES]   . Frequency of Communication with Friends and Family: More than three times a week   . Frequency of Social Gatherings with Friends and Family: Once a week   . Attends Religious Services: More than 4 times per year   . Active Member of Clubs or Organizations: Yes   . Attends Banker Meetings: More than 4 times per year   . Marital Status: Widowed    Review of Systems Per HPI  Objective:  BP (!) 157/81   Pulse 66   Temp 98.1 F (36.7 C)   Ht 5\' 6"  (1.676 m)   Wt 170 lb (77.1 kg)   SpO2 96%   BMI 27.44 kg/m      02/06/2023    1:10 PM 12/04/2022    1:47 PM 09/05/2022   10:51 AM  BP/Weight  Systolic BP 157 138 115  Diastolic BP 81 76 78  Wt. (Lbs) 170 169 158  BMI 27.44 kg/m2 27.28 kg/m2 25.5  kg/m2    Physical Exam  Lab Results  Component Value Date   WBC 5.6 12/04/2022   HGB 11.5 12/04/2022   HCT 35.6 12/04/2022   PLT 181 12/04/2022   GLUCOSE 106 (H) 12/04/2022   CHOL 110 08/12/2022   TRIG 91 08/12/2022   HDL 49 08/12/2022   LDLCALC 43 08/12/2022   ALT 15 12/04/2022   AST 20 12/04/2022   NA 140 12/04/2022   K 4.0 12/04/2022   CL 99 12/04/2022   CREATININE 0.80 12/04/2022   BUN 13 12/04/2022   CO2 27 12/04/2022   TSH 1.792 08/10/2022   INR 1.15 12/15/2016   HGBA1C 6.2 (H) 12/04/2022     Assessment & Plan:   Problem List Items Addressed This Visit   None   No orders of the defined types were placed in this encounter.   Follow-up:  Return in about 6 months (around 08/09/2023).  Everlene Other DO [facility]

## 2023-02-06 NOTE — Patient Instructions (Signed)
Continue your medications.  Follow up in 6 months.  Take care  Dr. Cook  

## 2023-02-07 NOTE — Assessment & Plan Note (Signed)
Improved and doing well on Requip.  Continue.

## 2023-02-07 NOTE — Assessment & Plan Note (Signed)
At goal on Lipitor. Continue. 

## 2023-02-07 NOTE — Assessment & Plan Note (Signed)
Stable.  Continue current medications.

## 2023-02-07 NOTE — Assessment & Plan Note (Signed)
A1c at goal.  Continue close monitoring.

## 2023-03-13 ENCOUNTER — Ambulatory Visit: Payer: Medicare Other

## 2023-08-09 ENCOUNTER — Ambulatory Visit: Payer: Medicare Other | Admitting: Family Medicine

## 2023-08-13 ENCOUNTER — Ambulatory Visit: Payer: Medicare Other | Admitting: Family Medicine

## 2023-10-11 ENCOUNTER — Ambulatory Visit: Admitting: Family Medicine

## 2023-10-11 VITALS — BP 124/76 | Ht 66.0 in | Wt 176.7 lb

## 2023-10-11 DIAGNOSIS — Z130 Encounter for screening for diseases of the blood and blood-forming organs and certain disorders involving the immune mechanism: Secondary | ICD-10-CM | POA: Diagnosis not present

## 2023-10-11 DIAGNOSIS — I10 Essential (primary) hypertension: Secondary | ICD-10-CM

## 2023-10-11 DIAGNOSIS — E782 Mixed hyperlipidemia: Secondary | ICD-10-CM

## 2023-10-11 DIAGNOSIS — E1169 Type 2 diabetes mellitus with other specified complication: Secondary | ICD-10-CM | POA: Diagnosis not present

## 2023-10-11 DIAGNOSIS — E119 Type 2 diabetes mellitus without complications: Secondary | ICD-10-CM

## 2023-10-11 NOTE — Progress Notes (Signed)
 Subjective:  Patient ID: Julia Choi, female    DOB: February 19, 1936  Age: 88 y.o. MRN: [id number]  CC:   Chief Complaint  Patient presents with   Follow-up    hypertension    HPI:  88 year old female presents for follow up.  Patient reports that she has had 2 large skin cancers removed from her scalp.  Follows closely with dermatology.  Blood pressure is well-controlled on nebivolol and Lasix.  Needs reassessment of A1c.  She is not currently on any pharmacotherapy regarding this.  Needs foot exam.  Compliant with Lipitor.  Needs lipid panel today.  Patient Active Problem List   Diagnosis Date Noted   RLS (restless legs syndrome) 09/06/2022   Aortic atherosclerosis (HCC) 08/14/2022   Osteoarthritis of left hip 08/14/2022   Obesity (BMI 30-39.9) 08/09/2022   Type 2 diabetes mellitus (HCC) 01/19/2022   Osteopenia 07/21/2021   Systolic murmur 07/21/2021   GERD (gastroesophageal reflux disease) 12/28/2020   Essential hypertension 12/15/2016   Mixed hyperlipidemia 12/15/2016    Social Hx   Social History   Socioeconomic History   Marital status: Widowed    Spouse name: Not on file   Number of children: 1   Years of education: Not on file   Highest education level: 12th grade  Occupational History   Occupation: retired  Tobacco Use   Smoking status: Never   Smokeless tobacco: Never  Substance and Sexual Activity   Alcohol use: No   Drug use: No   Sexual activity: Not Currently  Other Topics Concern   Not on file  Social History Narrative   Widowed since 2018   1 son, lives in [place]   2 grandchildren   1 great granddaughter.   Social Drivers of Corporate investment banker Strain: Low Risk  (10/07/2023)   Overall Financial Resource Strain (CARDIA)    Difficulty of Paying Living Expenses: Not hard at all  Food Insecurity: No Food Insecurity (10/07/2023)   Hunger Vital Sign    Worried About Running Out of Food in the Last Year: Never true    Ran Out  of Food in the Last Year: Never true  Transportation Needs: No Transportation Needs (10/07/2023)   PRAPARE - Administrator, Civil Service (Medical): No    Lack of Transportation (Non-Medical): No  Physical Activity: Insufficiently Active (10/07/2023)   Exercise Vital Sign    Days of Exercise per Week: 3 days    Minutes of Exercise per Session: 20 min  Stress: No Stress Concern Present (10/07/2023)   [employer] of Occupational Health - Occupational Stress Questionnaire    Feeling of Stress : Only a little  Social Connections: Moderately Integrated (10/07/2023)   Social Connection and Isolation Panel [NHANES]    Frequency of Communication with Friends and Family: Three times a week    Frequency of Social Gatherings with Friends and Family: Twice a week    Attends Religious Services: More than 4 times per year    Active Member of [employer] or Organizations: Yes    Attends Banker Meetings: More than 4 times per year    Marital Status: Widowed    Review of Systems Per HPI  Objective:  BP 124/76   Ht 5\' 6"  (1.676 m)   Wt 176 lb 11.2 oz (80.2 kg)   BMI 28.52 kg/m      10/11/2023    1:45 PM 02/06/2023    2:11 PM 02/06/2023  1:10 PM  BP/Weight  Systolic BP 124 141 157  Diastolic BP 76 80 81  Wt. (Lbs) 176.7  170  BMI 28.52 kg/m2  27.44 kg/m2    Physical Exam Constitutional:      General: She is not in acute distress. HENT:     Nose: Nose normal.  Eyes:     General:        Right eye: No discharge.        Left eye: No discharge.     Conjunctiva/sclera: Conjunctivae normal.  Cardiovascular:     Rate and Rhythm: Normal rate and regular rhythm.  Pulmonary:     Effort: Pulmonary effort is normal.     Breath sounds: Normal breath sounds. No wheezing, rhonchi or rales.  Feet:     Comments: Diabetic foot exam performed today.  See quality metrics section. Neurological:     Mental Status: She is alert.  Psychiatric:        Mood and Affect: Mood  normal.        Behavior: Behavior normal.     Lab Results  Component Value Date   WBC 6.0 10/11/2023   HGB 11.5 10/11/2023   HCT 35.4 10/11/2023   PLT 216 10/11/2023   GLUCOSE 142 (H) 10/11/2023   CHOL 174 10/11/2023   TRIG 242 (H) 10/11/2023   HDL 57 10/11/2023   LDLCALC 78 10/11/2023   ALT 15 10/11/2023   AST 22 10/11/2023   NA 138 10/11/2023   K 4.4 10/11/2023   CL 98 10/11/2023   CREATININE 1.04 (H) 10/11/2023   BUN 15 10/11/2023   CO2 24 10/11/2023   TSH 1.792 08/10/2022   INR 1.15 12/15/2016   HGBA1C 6.2 (H) 10/11/2023     Assessment & Plan:  Type 2 diabetes mellitus without complication, without long-term current use of insulin (HCC) Assessment & Plan: A1c well-controlled.  Returned at 6.2.  Will continue monitor closely.  Orders: -     CMP14+EGFR -     Hemoglobin A1c -     Microalbumin / creatinine urine ratio  Mixed hyperlipidemia Assessment & Plan: Lipids fairly well-controlled.  Continue Lipitor.  Orders: -     Lipid panel -     Atorvastatin Calcium; Take 1 tablet (10 mg total) by mouth daily.  Dispense: 90 tablet; Refill: 3  Screening for deficiency anemia -     CBC  Essential hypertension Assessment & Plan: Stable.  Continue current medications.  Orders: -     Nebivolol HCl; Take one tablet po every day  Dispense: 90 tablet; Refill: 3 -     Furosemide; Take 1 tablet (20 mg total) by mouth daily.  Dispense: 90 tablet; Refill: 3    Follow-up: 6 months  Loveda Colaizzi Adriana Simas DO [facility]

## 2023-10-11 NOTE — Patient Instructions (Addendum)
Labs today.  Follow up in 6 months.  Take care  Dr. Alyxis Grippi  

## 2023-10-12 LAB — CBC
Hematocrit: 35.4 % (ref 34.0–46.6)
Hemoglobin: 11.5 g/dL (ref 11.1–15.9)
MCH: 29.8 pg (ref 26.6–33.0)
MCHC: 32.5 g/dL (ref 31.5–35.7)
MCV: 92 fL (ref 79–97)
Platelets: 216 10*3/uL (ref 150–450)
RBC: 3.86 x10E6/uL (ref 3.77–5.28)
RDW: 13.5 % (ref 11.7–15.4)
WBC: 6 10*3/uL (ref 3.4–10.8)

## 2023-10-12 LAB — MICROALBUMIN / CREATININE URINE RATIO
Creatinine, Urine: 41.2 mg/dL
Microalb/Creat Ratio: <7 mg/g{creat} (ref 0–29)
Microalbumin, Urine: <3 ug/mL

## 2023-10-12 LAB — CMP14+EGFR
ALT: 15 IU/L (ref 0–32)
AST: 22 IU/L (ref 0–40)
Albumin: 4.6 g/dL (ref 3.7–4.7)
Alkaline Phosphatase: 94 IU/L (ref 44–121)
BUN/Creatinine Ratio: 14 (ref 12–28)
BUN: 15 mg/dL (ref 8–27)
Bilirubin Total: 0.5 mg/dL (ref 0.0–1.2)
CO2: 24 mmol/L (ref 20–29)
Calcium: 9.5 mg/dL (ref 8.7–10.3)
Chloride: 98 mmol/L (ref 96–106)
Creatinine, Ser: 1.04 mg/dL — ABNORMAL HIGH (ref 0.57–1.00)
Globulin, Total: 2.7 g/dL (ref 1.5–4.5)
Glucose: 142 mg/dL — ABNORMAL HIGH (ref 70–99)
Potassium: 4.4 mmol/L (ref 3.5–5.2)
Sodium: 138 mmol/L (ref 134–144)
Total Protein: 7.3 g/dL (ref 6.0–8.5)
eGFR: 53 mL/min/{1.73_m2} — ABNORMAL LOW (ref >59)

## 2023-10-12 LAB — LIPID PANEL
Chol/HDL Ratio: 3.1 ratio (ref 0.0–4.4)
Cholesterol, Total: 174 mg/dL (ref 100–199)
HDL: 57 mg/dL (ref >39)
LDL Chol Calc (NIH): 78 mg/dL (ref 0–99)
Triglycerides: 242 mg/dL — ABNORMAL HIGH (ref 0–149)
VLDL Cholesterol Cal: 39 mg/dL (ref 5–40)

## 2023-10-12 LAB — HEMOGLOBIN A1C
Est. average glucose Bld gHb Est-mCnc: 131 mg/dL
Hgb A1c MFr Bld: 6.2 % — ABNORMAL HIGH (ref 4.8–5.6)

## 2023-10-13 ENCOUNTER — Encounter: Payer: Self-pay | Admitting: Family Medicine

## 2023-10-14 MED ORDER — FUROSEMIDE 20 MG PO TABS: 20.0000 mg | ORAL_TABLET | Freq: Every day | ORAL | 3 refills | Status: AC

## 2023-10-14 MED ORDER — NEBIVOLOL HCL 20 MG PO TABS: ORAL_TABLET | ORAL | 3 refills | Status: AC

## 2023-10-14 MED ORDER — ATORVASTATIN CALCIUM 10 MG PO TABS: 10.0000 mg | ORAL_TABLET | Freq: Every day | ORAL | 3 refills | Status: AC

## 2023-10-14 NOTE — Assessment & Plan Note (Signed)
 Stable.  Continue current medications.

## 2023-10-14 NOTE — Assessment & Plan Note (Signed)
 Lipids fairly well-controlled.  Continue Lipitor.

## 2023-10-14 NOTE — Assessment & Plan Note (Signed)
 A1c well-controlled.  Returned at 6.2.  Will continue monitor closely.

## 2023-12-09 ENCOUNTER — Other Ambulatory Visit: Payer: Self-pay | Admitting: Family Medicine

## 2023-12-09 MED ORDER — PANTOPRAZOLE SODIUM 40 MG PO TBEC: 40.0000 mg | DELAYED_RELEASE_TABLET | Freq: Every day | ORAL | 3 refills | Status: DC

## 2024-02-06 ENCOUNTER — Ambulatory Visit

## 2024-02-06 DIAGNOSIS — E119 Type 2 diabetes mellitus without complications: Secondary | ICD-10-CM

## 2024-02-06 NOTE — Progress Notes (Unsigned)
 Julia Choi arrived 02/06/2024 and has given verbal consent to obtain images and complete their overdue diabetic retinal screening.  The images have been sent to an ophthalmologist or optometrist for review and interpretation.  Results will be sent back to Cook, Jayce G, DO for review.  Patient has been informed they will be contacted when we receive the results via telephone or MyChart

## 2024-02-07 LAB — HM DIABETES EYE EXAM

## 2024-03-06 DIAGNOSIS — Z483 Aftercare following surgery for neoplasm: Secondary | ICD-10-CM | POA: Diagnosis not present

## 2024-03-06 DIAGNOSIS — C490 Malignant neoplasm of connective and soft tissue of head, face and neck: Secondary | ICD-10-CM | POA: Diagnosis not present

## 2024-03-06 DIAGNOSIS — I10 Essential (primary) hypertension: Secondary | ICD-10-CM | POA: Diagnosis not present

## 2024-03-06 DIAGNOSIS — Z556 Problems related to health literacy: Secondary | ICD-10-CM

## 2024-03-06 DIAGNOSIS — Z85828 Personal history of other malignant neoplasm of skin: Secondary | ICD-10-CM | POA: Diagnosis not present

## 2024-04-09 ENCOUNTER — Other Ambulatory Visit: Payer: Self-pay | Admitting: Family Medicine

## 2024-04-09 MED ORDER — PANTOPRAZOLE SODIUM 40 MG PO TBEC: 40.0000 mg | DELAYED_RELEASE_TABLET | Freq: Every day | ORAL | 3 refills | Status: AC

## 2024-04-13 ENCOUNTER — Ambulatory Visit: Admitting: Family Medicine

## 2024-04-13 ENCOUNTER — Encounter: Payer: Self-pay | Admitting: Family Medicine

## 2024-04-13 VITALS — BP 188/75 | HR 74 | Ht 64.5 in | Wt 179.0 lb

## 2024-04-13 DIAGNOSIS — E119 Type 2 diabetes mellitus without complications: Secondary | ICD-10-CM

## 2024-04-13 DIAGNOSIS — N3000 Acute cystitis without hematuria: Secondary | ICD-10-CM

## 2024-04-13 DIAGNOSIS — Z130 Encounter for screening for diseases of the blood and blood-forming organs and certain disorders involving the immune mechanism: Secondary | ICD-10-CM | POA: Diagnosis not present

## 2024-04-13 DIAGNOSIS — I10 Essential (primary) hypertension: Secondary | ICD-10-CM | POA: Diagnosis not present

## 2024-04-13 DIAGNOSIS — L989 Disorder of the skin and subcutaneous tissue, unspecified: Secondary | ICD-10-CM

## 2024-04-13 DIAGNOSIS — N390 Urinary tract infection, site not specified: Secondary | ICD-10-CM | POA: Insufficient documentation

## 2024-04-13 DIAGNOSIS — R600 Localized edema: Secondary | ICD-10-CM

## 2024-04-13 DIAGNOSIS — E782 Mixed hyperlipidemia: Secondary | ICD-10-CM

## 2024-04-13 MED ORDER — CIPROFLOXACIN HCL 500 MG PO TABS: 500.0000 mg | ORAL_TABLET | Freq: Two times a day (BID) | ORAL | 0 refills | Status: DC

## 2024-04-13 NOTE — Assessment & Plan Note (Signed)
 Last A1c at goal. Reassessing today.

## 2024-04-13 NOTE — Assessment & Plan Note (Signed)
Rx given for compression stockings.

## 2024-04-13 NOTE — Assessment & Plan Note (Signed)
 Suspicious. Advised to reach out to dermatology.

## 2024-04-13 NOTE — Progress Notes (Signed)
 Subjective:  Patient ID: Julia Choi, female    DOB: June 10, 1936  Age: 88 y.o. MRN: [id number]  CC:   Chief Complaint  Patient presents with   Diabetes    Six month follow up    Foot Swelling    Swelling in both feet     HPI:  88 year old female presents for follow-up.   Patient reports that she has been having urinary frequency.  Recent UA was negative but culture returned positive for both Klebsiella and Enterococcus.  Blood pressure is markedly elevated here.  Her blood pressure is checked regularly at her assisted living facility.  Hypertension has been stable.  Patient reports lower extremity edema.  She is on Lasix .  Will evaluate today.  Patient followed by Dermatology regarding skin cancer. Has a lesion on the left side of the neck.   Patient Active Problem List   Diagnosis Date Noted   Lower extremity edema 04/13/2024   UTI (urinary tract infection) 04/13/2024   Skin lesion 04/13/2024   RLS (restless legs syndrome) 09/06/2022   Aortic atherosclerosis 08/14/2022   Osteoarthritis of left hip 08/14/2022   Obesity (BMI 30-39.9) 08/09/2022   Type 2 diabetes mellitus (HCC) 01/19/2022   Osteopenia 07/21/2021   Systolic murmur 07/21/2021   GERD (gastroesophageal reflux disease) 12/28/2020   Essential hypertension 12/15/2016   Mixed hyperlipidemia 12/15/2016    Social Hx   Social History   Socioeconomic History   Marital status: Widowed    Spouse name: Not on file   Number of children: 1   Years of education: Not on file   Highest education level: 12th grade  Occupational History   Occupation: retired  Tobacco Use   Smoking status: Never   Smokeless tobacco: Never  Substance and Sexual Activity   Alcohol use: No   Drug use: No   Sexual activity: Not Currently  Other Topics Concern   Not on file  Social History Narrative   Widowed since 2018   1 son, lives in [place]   2 grandchildren   1 great granddaughter.   Social Drivers of Manufacturing engineer Strain: Low Risk  (10/07/2023)   Overall Financial Resource Strain (CARDIA)    Difficulty of Paying Living Expenses: Not hard at all  Food Insecurity: No Food Insecurity (10/07/2023)   Hunger Vital Sign    Worried About Running Out of Food in the Last Year: Never true    Ran Out of Food in the Last Year: Never true  Transportation Needs: No Transportation Needs (10/07/2023)   PRAPARE - Administrator, Civil Service (Medical): No    Lack of Transportation (Non-Medical): No  Physical Activity: Insufficiently Active (10/07/2023)   Exercise Vital Sign    Days of Exercise per Week: 3 days    Minutes of Exercise per Session: 20 min  Stress: No Stress Concern Present (10/07/2023)   [employer] of Occupational Health - Occupational Stress Questionnaire    Feeling of Stress : Only a little  Social Connections: Moderately Integrated (10/07/2023)   Social Connection and Isolation Panel    Frequency of Communication with Friends and Family: Three times a week    Frequency of Social Gatherings with Friends and Family: Twice a week    Attends Religious Services: More than 4 times per year    Active Member of [employer] or Organizations: Yes    Attends Banker Meetings: More than 4 times per year    Marital Status:  Widowed    Review of Systems Per HPI  Objective:  BP (!) 188/75   Pulse 74   Ht 5' 4.5 (1.638 m)   Wt 179 lb (81.2 kg)   SpO2 96%   BMI 30.25 kg/m      04/13/2024   10:26 AM 10/11/2023    1:45 PM 02/06/2023    2:11 PM  BP/Weight  Systolic BP 188 124 141  Diastolic BP 75 76 80  Wt. (Lbs) 179 176.7   BMI 30.25 kg/m2 28.52 kg/m2     Physical Exam Vitals and nursing note reviewed.  Constitutional:      General: She is not in acute distress.    Appearance: Normal appearance.  HENT:     Head: Normocephalic and atraumatic.  Eyes:     General:        Right eye: No discharge.        Left eye: No discharge.      Conjunctiva/sclera: Conjunctivae normal.  Neck:     Comments: Raised lesion noted to the left side of the neck. Cardiovascular:     Rate and Rhythm: Normal rate and regular rhythm.     Comments: Mild lower extremity edema bilaterally. Pulmonary:     Effort: Pulmonary effort is normal.     Breath sounds: Normal breath sounds. No wheezing or rales.  Neurological:     Mental Status: She is alert.     Lab Results  Component Value Date   WBC 6.0 10/11/2023   HGB 11.5 10/11/2023   HCT 35.4 10/11/2023   PLT 216 10/11/2023   GLUCOSE 142 (H) 10/11/2023   CHOL 174 10/11/2023   TRIG 242 (H) 10/11/2023   HDL 57 10/11/2023   LDLCALC 78 10/11/2023   ALT 15 10/11/2023   AST 22 10/11/2023   NA 138 10/11/2023   K 4.4 10/11/2023   CL 98 10/11/2023   CREATININE 1.04 (H) 10/11/2023   BUN 15 10/11/2023   CO2 24 10/11/2023   TSH 1.792 08/10/2022   INR 1.15 12/15/2016   HGBA1C 6.2 (H) 10/11/2023     Assessment & Plan:  Essential hypertension Assessment & Plan: BP elevated here today.  Previous readings and readings at facility are well controlled. Continue Lasix  and Nebivolol .    Type 2 diabetes mellitus without complication, without long-term current use of insulin (HCC) Assessment & Plan: Last A1c at goal. Reassessing today.  Orders: -     CMP14+EGFR -     Hemoglobin A1c  Mixed hyperlipidemia Assessment & Plan: Lipid panel to assess.   Orders: -     CMP14+EGFR -     Lipid panel  Screening for deficiency anemia -     CBC  Lower extremity edema Assessment & Plan: Rx given for compression stockings.  Orders: -     Compression stockings  Acute cystitis without hematuria Assessment & Plan: Based on culture and sensitivities, treating with Cipro.  Orders: -     Ciprofloxacin HCl; Take 1 tablet (500 mg total) by mouth 2 (two) times daily.  Dispense: 14 tablet; Refill: 0  Skin lesion Assessment & Plan: Suspicious. Advised to reach out to  dermatology.     Follow-up:  6 months  Janiya Millirons Bluford DO [facility]

## 2024-04-13 NOTE — Assessment & Plan Note (Signed)
 Lipid panel to assess.

## 2024-04-13 NOTE — Patient Instructions (Signed)
 Medication sent in.  Call skin center.  Follow up in 6 months.

## 2024-04-13 NOTE — Assessment & Plan Note (Signed)
 BP elevated here today.  Previous readings and readings at facility are well controlled. Continue Lasix  and Nebivolol .

## 2024-04-13 NOTE — Assessment & Plan Note (Signed)
 Based on culture and sensitivities, treating with Cipro.

## 2024-04-14 LAB — LIPID PANEL
Chol/HDL Ratio: 3.4 ratio (ref 0.0–4.4)
Cholesterol, Total: 178 mg/dL (ref 100–199)
HDL: 53 mg/dL (ref >39)
LDL Chol Calc (NIH): 93 mg/dL (ref 0–99)
Triglycerides: 186 mg/dL — ABNORMAL HIGH (ref 0–149)
VLDL Cholesterol Cal: 32 mg/dL (ref 5–40)

## 2024-04-14 LAB — CBC
Hematocrit: 37.9 % (ref 34.0–46.6)
Hemoglobin: 12.3 g/dL (ref 11.1–15.9)
MCH: 30.7 pg (ref 26.6–33.0)
MCHC: 32.5 g/dL (ref 31.5–35.7)
MCV: 95 fL (ref 79–97)
Platelets: 199 x10E3/uL (ref 150–450)
RBC: 4.01 x10E6/uL (ref 3.77–5.28)
RDW: 13.1 % (ref 11.7–15.4)
WBC: 5.7 x10E3/uL (ref 3.4–10.8)

## 2024-04-14 LAB — HEMOGLOBIN A1C
Est. average glucose Bld gHb Est-mCnc: 151 mg/dL
Hgb A1c MFr Bld: 6.9 % — ABNORMAL HIGH (ref 4.8–5.6)

## 2024-04-14 LAB — CMP14+EGFR
ALT: 9 IU/L (ref 0–32)
AST: 19 IU/L (ref 0–40)
Albumin: 4.5 g/dL (ref 3.7–4.7)
Alkaline Phosphatase: 84 IU/L (ref 48–129)
BUN/Creatinine Ratio: 12 (ref 12–28)
BUN: 12 mg/dL (ref 8–27)
Bilirubin Total: 0.7 mg/dL (ref 0.0–1.2)
CO2: 23 mmol/L (ref 20–29)
Calcium: 9.8 mg/dL (ref 8.7–10.3)
Chloride: 102 mmol/L (ref 96–106)
Creatinine, Ser: 0.98 mg/dL (ref 0.57–1.00)
Globulin, Total: 2.5 g/dL (ref 1.5–4.5)
Glucose: 117 mg/dL — ABNORMAL HIGH (ref 70–99)
Potassium: 4.2 mmol/L (ref 3.5–5.2)
Sodium: 140 mmol/L (ref 134–144)
Total Protein: 7 g/dL (ref 6.0–8.5)
eGFR: 56 mL/min/1.73 — ABNORMAL LOW (ref >59)

## 2024-04-15 ENCOUNTER — Ambulatory Visit: Payer: Self-pay | Admitting: Family Medicine

## 2024-04-27 ENCOUNTER — Ambulatory Visit: Payer: Self-pay

## 2024-04-27 NOTE — Telephone Encounter (Signed)
 FYI Only or Action Required?: Action required by provider: Please see notes and call patient back at earliest convenience.  Patient was last seen in primary care on 04/13/2024 by Cook, Jayce G, DO.  Called Nurse Triage reporting [place].  Symptoms began Please see last office visit notes.  Interventions attempted: Nothing.  Symptoms are: stable.  Triage Disposition: Call PCP Within 24 Hours  Patient/caregiver understands and will follow disposition?: No, wishes to speak with PCP   Copied from CRM #[id number]. Topic: Clinical - Red Word Triage >> Apr 27, 2024 11:17 AM Willma SAUNDERS wrote: [facility] that prompted transfer to Nurse Triage: Patient has a lump on the left side of her neck. Dr Bluford seen previously and she thought he was going to refer to a surgeon at the skin surgery center. No referral but patient states lump is getting bigger, has history of skin cancer. Reason for Disposition  [1] Caller requests to speak ONLY to PCP AND [2] NON-URGENT question  Answer Assessment - Initial Assessment Questions 1. REASON FOR CALL or QUESTION: What is your reason for calling today? or How can I best     Patient calling in with assistance of home health nurse to report that the bump on her neck is getting bigger. Home Health Nurse, Pam, reports that bump has bullseye appearance in the middle. No fever. Patient is following up on the referral to surgeon for skin surgery and about bump. Patient would like to be contacted back on number ending in [phone].   Patient denies issues swallowing and no new problems.   2. CALLER: Document the source of call. (e.g., laboratory staff, caregiver or patient).     Patient  Protocols used: PCP Call - No Triage-A-AH

## 2024-04-29 NOTE — Telephone Encounter (Signed)
 Cook, Jayce G, DO      04/27/24  8:04 PM I advised her to reach out to her dermatologist. We can place a referral if needed.

## 2024-04-30 ENCOUNTER — Encounter: Payer: Self-pay | Admitting: *Deleted

## 2024-04-30 NOTE — Telephone Encounter (Signed)
 Patient notified of provider's recommendation via my chart

## 2024-05-20 ENCOUNTER — Ambulatory Visit: Payer: Self-pay

## 2024-05-20 ENCOUNTER — Encounter: Payer: Self-pay | Admitting: Family Medicine

## 2024-05-20 NOTE — Telephone Encounter (Signed)
 FYI Only or Action Required?: FYI only for provider: appointment scheduled on 11.6.25.  Patient was last seen in primary care on 04/13/2024 by Cook, Jayce G, DO.  Called Nurse Triage reporting Rash.  Symptoms began yesterday.  Interventions attempted: Nothing.  Symptoms are: unchanged.  Triage Disposition: See Physician Within 24 Hours  Patient/caregiver understands and will follow disposition?: Yes  Pt scheduled with female provider per family request.    Copied from CRM [id number]. Topic: Clinical - Red Word Triage >> May 20, 2024  2:10 PM Carla L wrote: [facility] that prompted transfer to Nurse Triage: UTI is not gone, possible staph infection, has sores in her private area Reason for Disposition  [1] Looks infected (e.g., spreading redness, pus) AND [2] no fever  Answer Assessment - Initial Assessment Questions Sores in her private area. Facility thinks its staph infection Had uti in September  No fever, no urinary symptoms   1. APPEARANCE of RASH: What does the rash look like? (e.g., blisters, dry flaky skin, red spots, redness, sores)     *No Answer* 2. SIZE: How big are the spots? (e.g., tip of pen, eraser, coin; inches, centimeters)     *No Answer* 3. LOCATION: Where is the rash located?     *No Answer* 4. COLOR: What color is the rash? (Note: It is difficult to assess rash color in people with darker-colored skin. When this situation occurs, simply ask the caller to describe what they see.)     *No Answer* 5. ONSET: When did the rash begin?     *No Answer* 6. FEVER: Do you have a fever? If Yes, ask: What is your temperature, how was it measured, and when did it start?     *No Answer* 7. ITCHING: Does the rash itch? If Yes, ask: How bad is the itch? (Scale 1-10; or mild, moderate, severe)     *No Answer* 8. CAUSE: What do you think is causing the rash?     *No Answer* 9. MEDICINE FACTORS: Have you started any new medicines within the last 2  weeks? (e.g., antibiotics)      *No Answer* 10. OTHER SYMPTOMS: Do you have any other symptoms? (e.g., dizziness, headache, sore throat, joint pain)       *No Answer* 11. PREGNANCY: Is there any chance you are pregnant? When was your last menstrual period?       *No Answer*  Answer Assessment - Initial Assessment Questions Pt's dtr in law states that pt had a uti and was treated with atbx. Pt told her yesterday that she noticed sores in her vaginal area. She states that they are painful but not itchy. The staff at the facility she lives is worried it's a staph infection. Dtr in law also states she seems slightly off mentally and typcially she is as sharp as a tack. She is wondering if maybe she has a UTI again. She states the pt denies any urinary symptoms.     1. LOCATION: Where is the wound located?      baginal 2. WOUND APPEARANCE: What does the wound look like?      5-+ open sores 3. SIZE: If redness is present, ask: What is the size of the red area? (Inches, centimeters, or compare to size of a coin)     unknown 4. SPREAD: What's changed in the last day?  Do you see any red streaks coming from the wound?     No change 5. ONSET: When did it start to  look infected?      unknown 6. MECHANISM: How did the wound start, what was the cause?     unknown 7. PAIN: Do you have any pain?  If Yes, ask: How bad is the pain?  (e.g., Scale 1-10; mild, moderate, or severe)     Pt did tell dtr in law it is painful 8. FEVER: Do you have a fever? If Yes, ask: What is your temperature, how was it measured, and when did it start?     no 9. OTHER SYMPTOMS: Do you have any other symptoms? (e.g., shaking chills, weakness, rash elsewhere on body)     Maybe some slight confusion  Protocols used: Rash or Redness - Widespread-A-AH, Wound Infection Suspected-A-AH

## 2024-05-20 NOTE — Telephone Encounter (Signed)
 Appt tomorrow

## 2024-05-21 ENCOUNTER — Other Ambulatory Visit: Payer: Self-pay

## 2024-05-21 ENCOUNTER — Ambulatory Visit: Admitting: Physician Assistant

## 2024-05-21 VITALS — BP 166/89 | HR 65 | Temp 98.2°F | Ht 64.5 in | Wt 178.2 lb

## 2024-05-21 DIAGNOSIS — R300 Dysuria: Secondary | ICD-10-CM

## 2024-05-21 DIAGNOSIS — N764 Abscess of vulva: Secondary | ICD-10-CM | POA: Diagnosis not present

## 2024-05-21 MED ORDER — AMOXICILLIN-POT CLAVULANATE 875-125 MG PO TABS: 1.0000 | ORAL_TABLET | Freq: Two times a day (BID) | ORAL | 0 refills | Status: AC

## 2024-05-21 MED ORDER — SULFAMETHOXAZOLE-TRIMETHOPRIM 800-160 MG PO TABS: 1.0000 | ORAL_TABLET | Freq: Two times a day (BID) | ORAL | 0 refills | Status: AC

## 2024-05-21 NOTE — Assessment & Plan Note (Signed)
 Intermittent dysuria with bleeding from vulvar abscesses. Previous UTI treated with antibiotics. No abdominal pain, N/V, or fevers.  - Urine sent for culture. - Prescribed Bactrim and Augmentin for concomitant vulvar infection, Bactrim would likely cover current UTI.  - Advised to monitor for worsening symptoms or fever.

## 2024-05-21 NOTE — Assessment & Plan Note (Signed)
 3-4 painful vulvar abscesses on exam. 1 area with active purulence and drainage. No vaginal discharge or bleeding. Patient appears well and is not in acute distress. Significant tenderness present.  - Prescribed Bactrim and Augmentin. - Follow-up following antibiotic course in approximately 8 days.  - Advised to return sooner if symptoms worsen or she develops fevers or AMS.  - Discussed potential OB GYN or general surgery referral if no improvement following antibiotics.

## 2024-05-21 NOTE — Progress Notes (Signed)
 Acute Office Visit  Subjective:     Patient ID: Julia Choi, female    DOB: October 02, 1935, 88 y.o.   MRN: [id number]   Discussed the use of AI scribe software for clinical note transcription with the patient, who gave verbal consent to proceed.  History of Present Illness Julia Choi is an 88 year old female who presents with painful boils and pimples in the genital area following treatment for a urinary tract infection.  She developed painful boils and pimples in the genital area after treatment for a urinary tract infection at the end of September. The lesions, described as large pimples and boils, have persisted for a month, with one area bleeding slightly. She experiences burning with urination and noted bleeding from one lesion during a bath this morning. Urinary symptoms have slightly improved but remain bothersome. She denies fever, nausea, vomiting, and sexual activity since 2015.    Review of Systems  Constitutional:  Negative for chills and fever.  Gastrointestinal:  Negative for nausea and vomiting.  Genitourinary:  Positive for dysuria. Negative for flank pain, frequency, hematuria and urgency.       Vulvar lesions        Objective:     BP (!) 166/89   Pulse 65   Temp 98.2 F (36.8 C)   Ht 5' 4.5 (1.638 m)   Wt 178 lb 4 oz (80.9 kg)   SpO2 97%   BMI 30.12 kg/m   Physical Exam Constitutional:      General: She is not in acute distress.    Appearance: Normal appearance. She is not ill-appearing.  HENT:     Head: Normocephalic and atraumatic.     Mouth/Throat:     Mouth: Mucous membranes are moist.     Pharynx: Oropharynx is clear.  Eyes:     Extraocular Movements: Extraocular movements intact.     Conjunctiva/sclera: Conjunctivae normal.  Cardiovascular:     Rate and Rhythm: Normal rate and regular rhythm.     Heart sounds: Normal heart sounds. No murmur heard. Pulmonary:     Effort: Pulmonary effort is normal.     Breath sounds: Normal breath  sounds.  Genitourinary:    Labia:        Right: Tenderness and lesion present.        Left: Tenderness and lesion present.    Skin:    General: Skin is warm and dry.  Neurological:     General: No focal deficit present.     Mental Status: She is alert and oriented to person, place, and time.  Psychiatric:        Mood and Affect: Mood normal.        Behavior: Behavior normal.     No results found for any visits on 05/21/24.      Assessment & Plan:  Vulvar abscess Assessment & Plan: 3-4 painful vulvar abscesses on exam. 1 area with active purulence and drainage. No vaginal discharge or bleeding. Patient appears well and is not in acute distress. Significant tenderness present.  - Prescribed Bactrim and Augmentin. - Follow-up following antibiotic course in approximately 8 days.  - Advised to return sooner if symptoms worsen or she develops fevers or AMS.  - Discussed potential OB GYN or general surgery referral if no improvement following antibiotics.  Orders: -     Sulfamethoxazole-Trimethoprim; Take 1 tablet by mouth 2 (two) times daily for 7 days.  Dispense: 14 tablet; Refill: 0 -  Amoxicillin-Pot Clavulanate; Take 1 tablet by mouth 2 (two) times daily for 7 days.  Dispense: 14 tablet; Refill: 0  Dysuria Assessment & Plan: Intermittent dysuria with bleeding from vulvar abscesses. Previous UTI treated with antibiotics. No abdominal pain, N/V, or fevers.  - Urine sent for culture. - Prescribed Bactrim and Augmentin for concomitant vulvar infection, Bactrim would likely cover current UTI.  - Advised to monitor for worsening symptoms or fever.  Orders: -     Urine Culture -     Urinalysis, Routine w reflex microscopic    Return in about 8 days (around 05/29/2024) for infection f/u .  Charmaine Beonka Amesquita, PA-C

## 2024-05-22 ENCOUNTER — Ambulatory Visit: Payer: Self-pay | Admitting: Physician Assistant

## 2024-05-22 LAB — MICROSCOPIC EXAMINATION
Bacteria, UA: NONE SEEN
Casts: NONE SEEN /LPF

## 2024-05-22 LAB — URINALYSIS, ROUTINE W REFLEX MICROSCOPIC
Bilirubin, UA: NEGATIVE
Glucose, UA: NEGATIVE
Ketones, UA: NEGATIVE
Nitrite, UA: NEGATIVE
Protein,UA: NEGATIVE
RBC, UA: NEGATIVE
Specific Gravity, UA: 1.016 (ref 1.005–1.030)
Urobilinogen, Ur: 0.2 mg/dL (ref 0.2–1.0)
pH, UA: 7.5 (ref 5.0–7.5)

## 2024-05-23 LAB — URINE CULTURE

## 2024-05-25 ENCOUNTER — Ambulatory Visit: Payer: Self-pay | Admitting: Physician Assistant

## 2024-06-01 ENCOUNTER — Ambulatory Visit (INDEPENDENT_AMBULATORY_CARE_PROVIDER_SITE_OTHER): Admitting: Physician Assistant

## 2024-06-01 ENCOUNTER — Encounter: Payer: Self-pay | Admitting: Physician Assistant

## 2024-06-01 VITALS — BP 143/84 | HR 71 | Temp 97.2°F | Ht 64.5 in | Wt 176.2 lb

## 2024-06-01 DIAGNOSIS — S3992XA Unspecified injury of lower back, initial encounter: Secondary | ICD-10-CM | POA: Diagnosis not present

## 2024-06-01 DIAGNOSIS — N764 Abscess of vulva: Secondary | ICD-10-CM

## 2024-06-01 MED ORDER — AMOXICILLIN-POT CLAVULANATE 875-125 MG PO TABS: 1.0000 | ORAL_TABLET | Freq: Two times a day (BID) | ORAL | 0 refills | Status: AC

## 2024-06-01 MED ORDER — TIZANIDINE HCL 4 MG PO TABS: 4.0000 mg | ORAL_TABLET | Freq: Four times a day (QID) | ORAL | 0 refills | Status: AC | PRN

## 2024-06-01 NOTE — Progress Notes (Signed)
 Established Patient Office Visit  Subjective   Patient ID: Julia Choi, female    DOB: 31-Dec-1935  Age: 88 y.o. MRN: [id number]  Chief Complaint  Patient presents with   Follow-up    She mentioned that her last visit, her antibiotics helped and now she is off of them, she is in pain again.  She mentioned she was going to the bathroom and she turned a certain way and she hit her back on something and it has been in pain for ahwile now. She bruised but it is healing.    Discussed the use of AI scribe software for clinical note transcription with the patient, who gave verbal consent to proceed.  History of Present Illness Julia Choi is an 88 year old female who presents with back pain and follow-up for skin infections.  She experiences persistent, severe back pain following a bruise sustained from sitting sideways on a commode. The pain radiates upwards from the bruise and remains constant. Tylenol  has been used daily without significant relief. She avoids NSAIDs due to concerns about kidney function, although recent tests were normal.  Her skin infections have improved with antibiotics, with reduced pain and discomfort. The abscesses have not completely resolved, but there is no further bleeding.    Review of Systems  Constitutional:  Negative for chills, fever and malaise/fatigue.  Gastrointestinal:  Negative for nausea and vomiting.  Genitourinary:  Negative for dysuria and urgency.  Musculoskeletal:  Positive for back pain. Negative for falls.      Objective:     BP (!) 143/84   Pulse 71   Temp (!) 97.2 F (36.2 C)   Ht 5' 4.5 (1.638 m)   Wt 176 lb 4 oz (79.9 kg)   SpO2 98%   BMI 29.79 kg/m    Physical Exam Constitutional:      General: She is not in acute distress.    Appearance: Normal appearance. She is obese. She is not ill-appearing.  HENT:     Head: Normocephalic and atraumatic.     Mouth/Throat:     Mouth: Mucous membranes are moist.     Pharynx:  Oropharynx is clear.  Eyes:     Extraocular Movements: Extraocular movements intact.     Conjunctiva/sclera: Conjunctivae normal.  Cardiovascular:     Rate and Rhythm: Normal rate and regular rhythm.     Heart sounds: Normal heart sounds. No murmur heard. Pulmonary:     Effort: Pulmonary effort is normal.     Breath sounds: Normal breath sounds.  Genitourinary:    Comments: 3-4 healing vulvar abscesses. No acute drainage.  Musculoskeletal:     Thoracic back: Spasms and tenderness present. No bony tenderness. Decreased range of motion.       Back:  Skin:    General: Skin is warm and dry.  Neurological:     General: No focal deficit present.     Mental Status: She is alert and oriented to person, place, and time.  Psychiatric:        Mood and Affect: Mood normal.        Behavior: Behavior normal.     No results found for any visits on 06/01/24.  The ASCVD Risk score (Arnett DK, et al., 2019) failed to calculate for the following reasons:   The 2019 ASCVD risk score is only valid for ages 51 to 27    Assessment & Plan:   Return if symptoms worsen or fail to improve.   Vulvar  abscess Assessment & Plan: Improving with antibiotics, not fully resolved. No urinary symptoms of fevers. 3-4 areas of healing abscesses still visualized on exam.  - Continue Augmentin for one more week as areas are not completely resolved.  - Monitor for fevers or new lesions. Follow up as needed.   Orders: -     Amoxicillin-Pot Clavulanate; Take 1 tablet by mouth 2 (two) times daily for 7 days.  Dispense: 14 tablet; Refill: 0  Back soft tissue injury, initial encounter Assessment & Plan: Back contusion with muscle spasm from injury while using the commode. Pain worsens with movement, decreased ROM secondary to pain. No acute neuro findings on exam. Tylenol  insufficient for pain control. - Muscle relaxer ordered for spasm, tightness, and pain. May take every 6 hours as needed. Caution sedation. -  Voltaren cream provided for topical use. - Continue Tylenol  for pain control. - Follow up as needed if symptoms do not improve within 2-4 weeks.   Orders: -     tiZANidine  HCl; Take 1 tablet (4 mg total) by mouth every 6 (six) hours as needed for muscle spasms.  Dispense: 30 tablet; Refill: 0     Delontae Lamm, PA-C

## 2024-06-01 NOTE — Assessment & Plan Note (Signed)
 Back contusion with muscle spasm from injury while using the commode. Pain worsens with movement, decreased ROM secondary to pain. No acute neuro findings on exam. Tylenol  insufficient for pain control. - Muscle relaxer ordered for spasm, tightness, and pain. May take every 6 hours as needed. Caution sedation. - Voltaren cream provided for topical use. - Continue Tylenol  for pain control. - Follow up as needed if symptoms do not improve within 2-4 weeks.

## 2024-06-01 NOTE — Assessment & Plan Note (Signed)
 Improving with antibiotics, not fully resolved. No urinary symptoms of fevers. 3-4 areas of healing abscesses still visualized on exam.  - Continue Augmentin for one more week as areas are not completely resolved.  - Monitor for fevers or new lesions. Follow up as needed.

## 2024-07-26 ENCOUNTER — Other Ambulatory Visit: Payer: Self-pay | Admitting: Family Medicine

## 2024-07-26 MED ORDER — ROPINIROLE HCL 0.25 MG PO TABS: 0.2500 mg | ORAL_TABLET | Freq: Every day | ORAL | 3 refills | Status: AC

## 2024-07-29 ENCOUNTER — Other Ambulatory Visit: Payer: Self-pay | Admitting: Family Medicine

## 2024-07-29 NOTE — Telephone Encounter (Unsigned)
 Copied from CRM [id number]. Topic: Clinical - Medication Refill >> Jul 29, 2024 11:08 AM Everette C wrote: Medication: rOPINIRole  (REQUIP ) 0.25 MG tablet [[id number]]  Has the patient contacted their pharmacy? Yes (Agent: If no, request that the patient contact the pharmacy for the refill. If patient does not wish to contact the pharmacy document the reason why and proceed with request.) (Agent: If yes, when and what did the pharmacy advise?)  This is the patient's preferred pharmacy:  [id number] - [place], [place] - [address] AT [facility] [address] [place] [address] Phone: [phone] Fax: [phone]  [facility] - [place], [place] - [address] [place] [address] Phone: [phone] Fax: [phone]  Is this the correct pharmacy for this prescription? Yes If no, delete pharmacy and type the correct one.   Has the prescription been filled recently? Yes  Is the patient out of the medication? Yes  Has the patient been seen for an appointment in the last year OR does the patient have an upcoming appointment? Yes  Can we respond through MyChart? No  Agent: Please be advised that Rx refills may take up to 3 business days. We ask that you follow-up with your pharmacy.

## 2024-08-04 ENCOUNTER — Encounter: Admitting: Adult Health

## 2024-08-13 ENCOUNTER — Other Ambulatory Visit: Payer: Self-pay | Admitting: Family Medicine

## 2024-08-13 ENCOUNTER — Encounter: Payer: Self-pay | Admitting: Family Medicine

## 2024-08-13 MED ORDER — TRIAMCINOLONE ACETONIDE 0.5 % EX OINT: 1.0000 | TOPICAL_OINTMENT | Freq: Two times a day (BID) | CUTANEOUS | 0 refills | Status: AC

## 2024-08-16 ENCOUNTER — Other Ambulatory Visit: Payer: Self-pay | Admitting: Family Medicine

## 2024-08-17 ENCOUNTER — Other Ambulatory Visit: Payer: Self-pay | Admitting: Family Medicine

## 2024-08-19 ENCOUNTER — Other Ambulatory Visit: Payer: Self-pay | Admitting: Family Medicine

## 2024-10-12 ENCOUNTER — Ambulatory Visit: Admitting: Family Medicine
